# Patient Record
Sex: Female | Born: 1950
Health system: Southern US, Community
[De-identification: ages and names within clinical notes are randomized; demographics above are authoritative.]

## PROBLEM LIST (undated history)

## (undated) DIAGNOSIS — K219 Gastro-esophageal reflux disease without esophagitis: Secondary | ICD-10-CM

## (undated) DIAGNOSIS — M8580 Other specified disorders of bone density and structure, unspecified site: Secondary | ICD-10-CM

## (undated) DIAGNOSIS — C541 Malignant neoplasm of endometrium: Secondary | ICD-10-CM

## (undated) DIAGNOSIS — R87619 Unspecified abnormal cytological findings in specimens from cervix uteri: Secondary | ICD-10-CM

## (undated) DIAGNOSIS — Z9221 Personal history of antineoplastic chemotherapy: Secondary | ICD-10-CM

## (undated) DIAGNOSIS — Z923 Personal history of irradiation: Secondary | ICD-10-CM

## (undated) DIAGNOSIS — E785 Hyperlipidemia, unspecified: Secondary | ICD-10-CM

## (undated) DIAGNOSIS — T7840XA Allergy, unspecified, initial encounter: Secondary | ICD-10-CM

## (undated) DIAGNOSIS — F419 Anxiety disorder, unspecified: Secondary | ICD-10-CM

## (undated) DIAGNOSIS — R011 Cardiac murmur, unspecified: Secondary | ICD-10-CM

## (undated) HISTORY — DX: Allergy, unspecified, initial encounter: T78.40XA

## (undated) HISTORY — PX: TUBAL LIGATION: SHX77

## (undated) HISTORY — DX: Other specified disorders of bone density and structure, unspecified site: M85.80

## (undated) HISTORY — PX: LAPAROSCOPY: SHX197

## (undated) HISTORY — DX: Cardiac murmur, unspecified: R01.1

## (undated) HISTORY — DX: Malignant neoplasm of endometrium: C54.1

## (undated) HISTORY — DX: Anxiety disorder, unspecified: F41.9

## (undated) HISTORY — DX: Hyperlipidemia, unspecified: E78.5

## (undated) HISTORY — PX: POLYPECTOMY: SHX149

## (undated) HISTORY — DX: Unspecified abnormal cytological findings in specimens from cervix uteri: R87.619

## (undated) HISTORY — PX: COLONOSCOPY: SHX174

## (undated) HISTORY — DX: Gastro-esophageal reflux disease without esophagitis: K21.9

## (undated) HISTORY — DX: Personal history of antineoplastic chemotherapy: Z92.21

## (undated) HISTORY — DX: Personal history of irradiation: Z92.3

---

## 1997-12-31 ENCOUNTER — Other Ambulatory Visit: Admission: RE | Admit: 1997-12-31 | Discharge: 1997-12-31 | Payer: Self-pay | Admitting: Obstetrics and Gynecology

## 2004-12-30 ENCOUNTER — Ambulatory Visit: Payer: Self-pay | Admitting: Internal Medicine

## 2005-01-04 ENCOUNTER — Ambulatory Visit: Payer: Self-pay | Admitting: Internal Medicine

## 2005-01-06 ENCOUNTER — Ambulatory Visit: Payer: Self-pay | Admitting: Internal Medicine

## 2005-05-25 ENCOUNTER — Ambulatory Visit: Payer: Self-pay | Admitting: Family Medicine

## 2005-11-21 ENCOUNTER — Ambulatory Visit: Payer: Self-pay | Admitting: Internal Medicine

## 2006-11-15 ENCOUNTER — Ambulatory Visit: Payer: Self-pay | Admitting: Internal Medicine

## 2006-11-15 LAB — CONVERTED CEMR LAB
ALT: 23 units/L (ref 0–40)
AST: 20 units/L (ref 0–37)
Albumin: 3.9 g/dL (ref 3.5–5.2)
Alkaline Phosphatase: 86 units/L (ref 39–117)
Bilirubin, Direct: 0.1 mg/dL (ref 0.0–0.3)
Cholesterol: 213 mg/dL (ref 0–200)
Direct LDL: 132.1 mg/dL
HDL: 43.6 mg/dL (ref >39.0)
Total Bilirubin: 0.7 mg/dL (ref 0.3–1.2)
Total CHOL/HDL Ratio: 4.9
Total Protein: 6.5 g/dL (ref 6.0–8.3)
Triglycerides: 185 mg/dL — ABNORMAL HIGH (ref 0–149)
VLDL: 37 mg/dL (ref 0–40)

## 2008-05-06 ENCOUNTER — Telehealth: Payer: Self-pay | Admitting: Internal Medicine

## 2008-05-08 ENCOUNTER — Ambulatory Visit: Payer: Self-pay | Admitting: Internal Medicine

## 2008-05-08 DIAGNOSIS — E785 Hyperlipidemia, unspecified: Secondary | ICD-10-CM | POA: Insufficient documentation

## 2008-05-08 DIAGNOSIS — F329 Major depressive disorder, single episode, unspecified: Secondary | ICD-10-CM | POA: Insufficient documentation

## 2008-05-09 LAB — CONVERTED CEMR LAB
Bilirubin, Direct: 0.1 mg/dL (ref 0.0–0.3)
Direct LDL: 138.9 mg/dL
HDL: 47.7 mg/dL (ref >39.0)
Total Bilirubin: 0.7 mg/dL (ref 0.3–1.2)
Total CHOL/HDL Ratio: 4.6
Triglycerides: 122 mg/dL (ref 0–149)
VLDL: 24 mg/dL (ref 0–40)

## 2008-06-10 ENCOUNTER — Ambulatory Visit: Payer: Self-pay | Admitting: Internal Medicine

## 2008-06-24 ENCOUNTER — Encounter: Payer: Self-pay | Admitting: Internal Medicine

## 2008-06-24 ENCOUNTER — Ambulatory Visit: Payer: Self-pay | Admitting: Internal Medicine

## 2008-06-25 ENCOUNTER — Encounter: Payer: Self-pay | Admitting: Internal Medicine

## 2009-08-01 LAB — HM MAMMOGRAPHY: HM Mammogram: NORMAL

## 2009-08-01 LAB — CONVERTED CEMR LAB: Pap Smear: NORMAL

## 2009-10-14 ENCOUNTER — Telehealth: Payer: Self-pay | Admitting: Internal Medicine

## 2009-10-15 ENCOUNTER — Ambulatory Visit: Payer: Self-pay | Admitting: Internal Medicine

## 2009-10-15 LAB — CONVERTED CEMR LAB
BUN: 10 mg/dL (ref 6–23)
Basophils Absolute: 0 10*3/uL (ref 0.0–0.1)
Bilirubin Urine: NEGATIVE
Chloride: 108 meq/L (ref 96–112)
Cholesterol: 272 mg/dL — ABNORMAL HIGH (ref 0–200)
Creatinine, Ser: 0.9 mg/dL (ref 0.4–1.2)
Direct LDL: 192.6 mg/dL
Eosinophils Relative: 1.8 % (ref 0.0–5.0)
GFR calc non Af Amer: 68.42 mL/min (ref >60)
Glucose, Bld: 89 mg/dL (ref 70–99)
Glucose, Urine, Semiquant: NEGATIVE
HCT: 43 % (ref 36.0–46.0)
HDL: 48.5 mg/dL (ref >39.00)
Ketones, urine, test strip: NEGATIVE
Lymphs Abs: 2.9 10*3/uL (ref 0.7–4.0)
MCV: 102.8 fL — ABNORMAL HIGH (ref 78.0–100.0)
Monocytes Absolute: 0.6 10*3/uL (ref 0.1–1.0)
Neutrophils Relative %: 62.6 % (ref 43.0–77.0)
Platelets: 303 10*3/uL (ref 150.0–400.0)
Potassium: 4.5 meq/L (ref 3.5–5.1)
Protein, U semiquant: NEGATIVE
RDW: 12.3 % (ref 11.5–14.6)
Specific Gravity, Urine: 1.01
TSH: 1.69 microintl units/mL (ref 0.35–5.50)
Total Bilirubin: 0.4 mg/dL (ref 0.3–1.2)
VLDL: 52 mg/dL — ABNORMAL HIGH (ref 0.0–40.0)
WBC: 9.9 10*3/uL (ref 4.5–10.5)
pH: 6

## 2009-11-12 ENCOUNTER — Ambulatory Visit: Payer: Self-pay | Admitting: Internal Medicine

## 2009-11-12 DIAGNOSIS — J019 Acute sinusitis, unspecified: Secondary | ICD-10-CM

## 2010-01-15 ENCOUNTER — Ambulatory Visit: Payer: Self-pay | Admitting: Internal Medicine

## 2010-01-15 LAB — CONVERTED CEMR LAB
ALT: 24 units/L (ref 0–35)
AST: 28 units/L (ref 0–37)
Albumin: 4.2 g/dL (ref 3.5–5.2)
Cholesterol: 205 mg/dL — ABNORMAL HIGH (ref 0–200)
Direct LDL: 133.9 mg/dL
HDL: 55.5 mg/dL (ref >39.00)
Total Protein: 7 g/dL (ref 6.0–8.3)
Triglycerides: 125 mg/dL (ref 0.0–149.0)

## 2010-09-02 NOTE — Progress Notes (Signed)
Summary: REFILL  Phone Note Refill Request Message from:  Fax from Pharmacy  Refills Requested: Medication #1:  LIPITOR 20 MG  TABS Take 1 tablet by mouth at bedtime as directed [facility] [facility]---[phone]     FAX---[phone]  Initial call taken by: Warnell Forester,  October 14, 2009 8:34 AM  Follow-up for Phone Call        INFO ONLY - Pt has appt scheduled for labs 10/15/2009.... follow-up appt w/ Dr Cato Mulligan on 10/27/2009.  Follow-up by: Debbra Riding,  October 14, 2009 11:25 AM    Prescriptions: LIPITOR 20 MG  TABS (ATORVASTATIN CALCIUM) Take 1 tablet by mouth at bedtime as directed  #30 x 0   Entered by:   Lynann Beaver CMA   Authorized by:   Birdie Sons MD   Signed by:   Lynann Beaver CMA on 10/14/2009   Method used:   [facility]ronically to        [facility]  [phone]* (retail)       [address] [place], [place]  [address]       Ph: [phone] or [phone]       Fax: [phone]   RxID:   [id number]

## 2010-09-02 NOTE — Progress Notes (Signed)
Summary: REQ FOR LABWORK  Phone Note Call from Patient   Caller: Patient [phone] Reason for Call: Talk to Doctor Summary of Call: Pt called to make appt for labs  /   f/u appt with Dr Cato Mulligan....Marland KitchenMarland KitchenCan you adv orders for labwork? Hepatic/Liver Function Pnl only?  Initial call taken by: Debbra Riding,  October 14, 2009 11:21 AM  Follow-up for Phone Call        pt hasnet been seen since 2009 so it is hard to decided what to do - he probably needs a physical- I would see if he doesnt want t cpxc and then do cpx labs prioir Follow-up by: Willy Eddy, LPN,  October 14, 2009 1:27 PM  Additional Follow-up for Phone Call Additional follow up Details #1::        Phone Call Completed --- Pt decided to have labs tomorrow, 10/15/2009 and will have cpx w/ Dr Cato Mulligan on 11/05/2009. Additional Follow-up by: Debbra Riding,  October 14, 2009 2:03 PM

## 2010-09-02 NOTE — Assessment & Plan Note (Signed)
Summary: CPX // RS/pt rescd from bump//ccm   Vital Signs:  Patient profile:   60 year old female Menstrual status:  postmenopausal Height:      63 inches Weight:      155 pounds BMI:     27.56 Pulse rate:   78 / minute Pulse rhythm:   regular Resp:     12 per minute BP sitting:   100 / 80  (left arm) Cuff size:   regular  Vitals Entered By: Gladis Riffle, RN (November 12, 2009 12:08 PM) CC: cpx, labs done--has gyn Is Patient Diabetic? No     Menstrual Status postmenopausal Last PAP Result normal-pt's report   CC:  cpx and labs done--has gyn.  History of Present Illness: cpx  Preventive Screening-Counseling & Management  Alcohol-Tobacco     Smoking Status: quit     Year Quit: september 2008  Current Problems (verified): 1)  Hyperlipidemia  (ICD-272.4) 2)  Depression  (ICD-311)  Current Medications (verified): 1)  None  Allergies: 1)  ! Codeine Sulfate (Codeine Sulfate) 2)  ! Neosporin  Past History:  Past Medical History: Last updated: 05/08/2008 migraine Depression Hyperlipidemia  Past Surgical History: Last updated: 05/08/2008 Tubal ligation laparoscopy  Family History: Last updated: 11/12/2009 father - chf, mi, in 21s brother mi---40 yo  Social History: Last updated: 05/08/2008 Married Former Smoker  Risk Factors: Smoking Status: quit (11/12/2009)  Family History: father - chf, mi, in 8s brother mi---40 yo  Physical Exam  General:  Well-developed,well-nourished,in no acute distress; alert,appropriate and cooperative throughout examination Head:  normocephalic and atraumatic.  normocephalic and atraumatic.   Eyes:  pupils equal and pupils round.  pupils equal and pupils round.   Ears:  R ear normal and L ear normal.  R ear normal and L ear normal.   Nose:  no external deformity.  no external deformity and no external erythema.   Abdomen:  soft and non-tender.   Msk:  No deformity or scoliosis noted of thoracic or lumbar spine.     Pulses:  R radial normal and L radial normal.   Extremities:  No clubbing, cyanosis, edema, or deformity noted  Neurologic:  cranial nerves II-XII intact and gait normal.   Skin:  turgor normal and color normal.   Psych:  memory intact for recent and remote and good eye contact.     Impression & Recommendations:  Problem # 1:  Preventive Health Care (ICD-V70.0)  health maint UTD  Problem # 2:  HYPERLIPIDEMIA (ICD-272.4)  controlled Labs Reviewed: SGOT: 19 (10/15/2009)   SGPT: 18 (10/15/2009)   HDL:48.50 (10/15/2009), 47.7 (05/08/2008)  LDL:DEL (05/08/2008), DEL (11/15/2006)  Chol:272 (10/15/2009), 218 (05/08/2008)  Trig:260.0 (10/15/2009), 122 (05/08/2008)  Problem # 3:  SINUSITIS- ACUTE-NOS (ICD-461.9) new complaint   URI sxs dration 2 weeks she describes mucopurulent exudate from her nose.  Sometimes associated with epistaxis.  Has some facial discomfort.  She denies any fevers or chills.  On examination she does have some nasal mucosal erythema.  Given duration will treat with antibiotics.  Side effects discussed. insomnia  advised exercise melatonin  Complete Medication List: 1)  Simvastatin 40 Mg Tabs (Simvastatin) .... Take one tablet at bedtime or as directed  Other Orders: Tdap => 55yrs IM [phone]) Admin 1st Vaccine (60454)  Preventive Care Screening  Mammogram:    Date:  08/01/2009    Next Due:  08/2011    Results:  normal-pt's report   Pap Smear:    Date:  08/01/2009  Next Due:  08/2012    Results:  normal-pt's report    Patient Instructions: 1)  Please schedule a follow-up appointment in 2 months. just labs---no office visit 2)  lipids 272.4 3)  liver 995.2  Prescriptions: DOXYCYCLINE HYCLATE 100 MG CAPS (DOXYCYCLINE HYCLATE) Take 1 tab twice a day  #20 x 0   Entered and Authorized by:   Birdie Sons MD   Signed by:   Birdie Sons MD on 11/12/2009   Method used:   [facility]ronically to        [facility]  [phone]* (retail)       [address] [place], [place]  [address]       Ph: [phone] or [phone]       Fax: [phone]   RxID:   [id number] SIMVASTATIN 40 MG TABS (SIMVASTATIN) Take one tablet at bedtime or as directed  #90 x 3   Entered and Authorized by:   Birdie Sons MD   Signed by:   Birdie Sons MD on 11/12/2009   Method used:   [facility]ronically to        [facility]  [phone]* (retail)       [address] [place], [place]  [address]       Ph: [phone] or [phone]       Fax: [phone]   RxID:   [id number]    Immunizations Administered:  Tetanus Vaccine:    Vaccine Type: Tdap    Site: left deltoid    Mfr: GlaxoSmithKline    Dose: 0.5 ml    Route: IM    Given by: Gladis Riffle, RN    Exp. Date: 10/24/2011    Lot #: [id number]

## 2011-05-28 ENCOUNTER — Other Ambulatory Visit: Payer: Self-pay | Admitting: Internal Medicine

## 2011-05-30 ENCOUNTER — Telehealth: Payer: Self-pay | Admitting: Internal Medicine

## 2011-05-30 NOTE — Telephone Encounter (Signed)
Pt called and is req to get cholesterol labs ordered so she can get her Simvastatin 40 mg. Pt takes 20 mg a day. [facility] [address]

## 2011-05-30 NOTE — Telephone Encounter (Signed)
Pt needs CPX with labs 1 wk prior

## 2011-05-31 NOTE — Telephone Encounter (Signed)
Pt is set up for cpx and labs

## 2011-07-12 ENCOUNTER — Other Ambulatory Visit (INDEPENDENT_AMBULATORY_CARE_PROVIDER_SITE_OTHER): Payer: No Typology Code available for payment source

## 2011-07-12 DIAGNOSIS — Z0000 Encounter for general adult medical examination without abnormal findings: Secondary | ICD-10-CM

## 2011-07-12 LAB — POCT URINALYSIS DIPSTICK
Ketones, UA: NEGATIVE
Protein, UA: NEGATIVE
Spec Grav, UA: <=1.005
Urobilinogen, UA: 0.2
pH, UA: 6

## 2011-07-12 LAB — CBC WITH DIFFERENTIAL/PLATELET
Basophils Absolute: 0 10*3/uL (ref 0.0–0.1)
Eosinophils Relative: 0.9 % (ref 0.0–5.0)
Lymphs Abs: 1.8 10*3/uL (ref 0.7–4.0)
MCV: 102 fl — ABNORMAL HIGH (ref 78.0–100.0)
Monocytes Absolute: 0.6 10*3/uL (ref 0.1–1.0)
Monocytes Relative: 5.1 % (ref 3.0–12.0)
Neutrophils Relative %: 77.9 % — ABNORMAL HIGH (ref 43.0–77.0)
Platelets: 262 10*3/uL (ref 150.0–400.0)
RDW: 13.4 % (ref 11.5–14.6)
WBC: 11.5 10*3/uL — ABNORMAL HIGH (ref 4.5–10.5)

## 2011-07-12 LAB — BASIC METABOLIC PANEL
BUN: 14 mg/dL (ref 6–23)
Creatinine, Ser: 1 mg/dL (ref 0.4–1.2)
GFR: 62.37 mL/min (ref >60.00)
Glucose, Bld: 91 mg/dL (ref 70–99)

## 2011-07-12 LAB — HEPATIC FUNCTION PANEL
ALT: 27 U/L (ref 0–35)
Bilirubin, Direct: 0 mg/dL (ref 0.0–0.3)
Total Bilirubin: 0.6 mg/dL (ref 0.3–1.2)

## 2011-07-12 LAB — LIPID PANEL
Cholesterol: 191 mg/dL (ref 0–200)
HDL: 55.3 mg/dL (ref >39.00)
LDL Cholesterol: 118 mg/dL — ABNORMAL HIGH (ref 0–99)
Triglycerides: 90 mg/dL (ref 0.0–149.0)
VLDL: 18 mg/dL (ref 0.0–40.0)

## 2011-07-12 LAB — TSH: TSH: 1.01 u[IU]/mL (ref 0.35–5.50)

## 2011-07-15 ENCOUNTER — Other Ambulatory Visit (HOSPITAL_COMMUNITY)
Admission: RE | Admit: 2011-07-15 | Discharge: 2011-07-15 | Disposition: A | Discharge status: Home / Self Care | Payer: No Typology Code available for payment source | Source: Ambulatory Visit | Attending: Obstetrics and Gynecology | Admitting: Obstetrics and Gynecology

## 2011-07-15 ENCOUNTER — Other Ambulatory Visit: Payer: Self-pay | Admitting: Obstetrics and Gynecology

## 2011-07-15 DIAGNOSIS — Z01419 Encounter for gynecological examination (general) (routine) without abnormal findings: Secondary | ICD-10-CM | POA: Insufficient documentation

## 2011-07-19 ENCOUNTER — Encounter: Payer: Self-pay | Admitting: Internal Medicine

## 2011-08-16 ENCOUNTER — Ambulatory Visit (INDEPENDENT_AMBULATORY_CARE_PROVIDER_SITE_OTHER): Payer: No Typology Code available for payment source | Admitting: Internal Medicine

## 2011-08-16 ENCOUNTER — Encounter: Payer: Self-pay | Admitting: Internal Medicine

## 2011-08-16 VITALS — BP 142/86 | HR 96 | Temp 98.2°F | Ht 63.0 in | Wt 151.0 lb

## 2011-08-16 DIAGNOSIS — Z0000 Encounter for general adult medical examination without abnormal findings: Secondary | ICD-10-CM

## 2011-08-16 MED ORDER — ATORVASTATIN CALCIUM 20 MG PO TABS: 20.0000 mg | ORAL_TABLET | Freq: Every day | ORAL | Status: DC

## 2011-08-16 NOTE — Progress Notes (Signed)
  Subjective:    Patient ID: Jacqueline Hammond, female    DOB: 07-Jan-1951, 61 y.o.   MRN: [id number]  HPI  cpx  Past Medical History  Diagnosis Date  . Hyperlipidemia   . Depression   . Migraine    Past Surgical History  Procedure Date  . Tubal ligation   . Laparoscopy     reports that she has quit smoking. Her smoking use included Cigarettes. She does not have any smokeless tobacco history on file. Her alcohol and drug histories not on file. family history includes Heart attack (age of onset:40) in her brother and father and Heart failure in her father. Allergies  Allergen Reactions  . Codeine Sulfate     REACTION: nausea, pass out  . Triple Antibiotic     REACTION: redness     Review of Systems  patient denies chest pain, shortness of breath, orthopnea. Denies lower extremity edema, abdominal pain, change in appetite, change in bowel movements. Patient denies rashes, musculoskeletal complaints. No other specific complaints in a complete review of systems.      Objective:   Physical Exam  Well-developed well-nourished female in no acute distress. HEENT exam atraumatic, normocephalic, extraocular muscles are intact. Neck is supple. No jugular venous distention no thyromegaly. Chest clear to auscultation without increased work of breathing. Cardiac exam S1 and S2 are regular. Abdominal exam active bowel sounds, soft, nontender. Extremities no edema. Neurologic exam she is alert without any motor sensory deficits. Gait is normal.     Assessment & Plan:  Well Visit: Health Maint UTD

## 2012-04-12 ENCOUNTER — Ambulatory Visit (INDEPENDENT_AMBULATORY_CARE_PROVIDER_SITE_OTHER): Payer: No Typology Code available for payment source | Admitting: Internal Medicine

## 2012-04-12 ENCOUNTER — Encounter: Payer: Self-pay | Admitting: Internal Medicine

## 2012-04-12 VITALS — BP 120/80 | Temp 98.4°F | Wt 148.0 lb

## 2012-04-12 DIAGNOSIS — E785 Hyperlipidemia, unspecified: Secondary | ICD-10-CM

## 2012-04-12 DIAGNOSIS — Z23 Encounter for immunization: Secondary | ICD-10-CM

## 2012-04-12 DIAGNOSIS — J069 Acute upper respiratory infection, unspecified: Secondary | ICD-10-CM

## 2012-04-12 MED ORDER — BENZONATATE 200 MG PO CAPS: 200.0000 mg | ORAL_CAPSULE | Freq: Three times a day (TID) | ORAL | Status: AC | PRN

## 2012-04-12 NOTE — Patient Instructions (Addendum)
Use saline irrigation, warm  moist compresses and over-the-counter decongestants only as directed.  Call if there is no improvement in 5 to 7 days, or sooner if you develop increasing pain, fever, or any new symptoms.  Mucinex DM one twice daily  Cough medicine as directed

## 2012-04-12 NOTE — Progress Notes (Signed)
Subjective:    Patient ID: Jacqueline Hammond, female    DOB: Oct 22, 1950, 61 y.o.   MRN: [id number]  HPI  61 year old patient who presents with a two-week history of bed and chest congestion with a productive cough. Sputum is clear. No fever wheezing shortness of breath. She has a history of dyslipidemia taking atorvastatin only. Non-smoker. Codeine allergy  Past Medical History  Diagnosis Date  . Hyperlipidemia   . Depression   . Migraine     History   Social History  . Marital Status: Married    Spouse Name: N/A    Number of Children: N/A  . Years of Education: N/A   Occupational History  . Not on file.   Social History Main Topics  . Smoking status: Former Smoker    Types: Cigarettes  . Smokeless tobacco: Not on file  . Alcohol Use: Not on file  . Drug Use: Not on file  . Sexually Active: Not on file   Other Topics Concern  . Not on file   Social History Narrative  . No narrative on file    Past Surgical History  Procedure Date  . Tubal ligation   . Laparoscopy     Family History  Problem Relation Age of Onset  . Heart failure Father   . Heart attack Father 73  . Heart attack Brother 40    Allergies  Allergen Reactions  . Codeine Sulfate     REACTION: nausea, pass out  . Neomycin-Bacitracin Zn-Polymyx     REACTION: redness    Current Outpatient Prescriptions on File Prior to Visit  Medication Sig Dispense Refill  . atorvastatin (LIPITOR) 20 MG tablet Take 1 tablet (20 mg total) by mouth daily.  90 tablet  3    BP 120/80  Temp 98.4 F (36.9 C) (Oral)  Wt 148 lb (67.132 kg)        Review of Systems  Constitutional: Negative.   HENT: Positive for congestion. Negative for hearing loss, sore throat, rhinorrhea, dental problem, sinus pressure and tinnitus.   Eyes: Negative for pain, discharge and visual disturbance.  Respiratory: Positive for cough. Negative for shortness of breath.   Cardiovascular: Negative for chest pain, palpitations and  leg swelling.  Gastrointestinal: Negative for nausea, vomiting, abdominal pain, diarrhea, constipation, blood in stool and abdominal distention.  Genitourinary: Negative for dysuria, urgency, frequency, hematuria, flank pain, vaginal bleeding, vaginal discharge, difficulty urinating, vaginal pain and pelvic pain.  Musculoskeletal: Negative for joint swelling, arthralgias and gait problem.  Skin: Negative for rash.  Neurological: Negative for dizziness, syncope, speech difficulty, weakness, numbness and headaches.  Hematological: Negative for adenopathy.  Psychiatric/Behavioral: Negative for behavioral problems, dysphoric mood and agitation. The patient is not nervous/anxious.        Objective:   Physical Exam  Constitutional: She is oriented to person, place, and time. She appears well-developed and well-nourished.  HENT:  Head: Normocephalic.  Right Ear: External ear normal.  Left Ear: External ear normal.  Mouth/Throat: Oropharynx is clear and moist.  Eyes: Conjunctivae normal and EOM are normal. Pupils are equal, round, and reactive to light.  Neck: Normal range of motion. Neck supple. No thyromegaly present.  Cardiovascular: Normal rate, regular rhythm, normal heart sounds and intact distal pulses.   Pulmonary/Chest: Effort normal and breath sounds normal. No respiratory distress. She has no wheezes. She has no rales. She exhibits no tenderness.  Abdominal: Soft. Bowel sounds are normal. She exhibits no mass. There is no tenderness.  Musculoskeletal: Normal  range of motion.  Lymphadenopathy:    She has no cervical adenopathy.  Neurological: She is alert and oriented to person, place, and time.  Skin: Skin is warm and dry. No rash noted.  Psychiatric: She has a normal mood and affect. Her behavior is normal.          Assessment & Plan:   Viral URI with cough. We'll treat symptomatically

## 2012-10-06 ENCOUNTER — Other Ambulatory Visit: Payer: Self-pay | Admitting: Internal Medicine

## 2012-12-04 ENCOUNTER — Ambulatory Visit (INDEPENDENT_AMBULATORY_CARE_PROVIDER_SITE_OTHER): Payer: No Typology Code available for payment source | Admitting: Internal Medicine

## 2012-12-04 ENCOUNTER — Encounter: Payer: Self-pay | Admitting: Internal Medicine

## 2012-12-04 VITALS — BP 132/80 | HR 84 | Temp 98.0°F | Ht 63.0 in | Wt 144.0 lb

## 2012-12-04 DIAGNOSIS — R319 Hematuria, unspecified: Secondary | ICD-10-CM | POA: Insufficient documentation

## 2012-12-04 DIAGNOSIS — Z23 Encounter for immunization: Secondary | ICD-10-CM

## 2012-12-04 DIAGNOSIS — E785 Hyperlipidemia, unspecified: Secondary | ICD-10-CM

## 2012-12-04 LAB — BASIC METABOLIC PANEL
BUN: 14 mg/dL (ref 6–23)
Chloride: 103 mEq/L (ref 96–112)
GFR: 62.08 mL/min (ref >60.00)
Glucose, Bld: 104 mg/dL — ABNORMAL HIGH (ref 70–99)
Potassium: 5.3 mEq/L — ABNORMAL HIGH (ref 3.5–5.1)
Sodium: 138 mEq/L (ref 135–145)

## 2012-12-04 LAB — TSH: TSH: 0.86 u[IU]/mL (ref 0.35–5.50)

## 2012-12-04 LAB — LIPID PANEL
Cholesterol: 190 mg/dL (ref 0–200)
HDL: 48.8 mg/dL (ref >39.00)
LDL Cholesterol: 121 mg/dL — ABNORMAL HIGH (ref 0–99)
VLDL: 19.8 mg/dL (ref 0.0–40.0)

## 2012-12-04 LAB — HEPATIC FUNCTION PANEL
ALT: 23 U/L (ref 0–35)
AST: 19 U/L (ref 0–37)
Albumin: 4.1 g/dL (ref 3.5–5.2)
Alkaline Phosphatase: 81 U/L (ref 39–117)
Total Bilirubin: 0.7 mg/dL (ref 0.3–1.2)

## 2012-12-04 NOTE — Assessment & Plan Note (Signed)
Needs f/u labs Will check today

## 2012-12-05 NOTE — Progress Notes (Signed)
Patient ID: Jacqueline Hammond, female   DOB: Mar 07, 1951, 62 y.o.   MRN: [id number] Patient comes in without specific complaints. She needs refill of medications. She has hyperlipidemia and is tolerating medications without difficulty.  Reviewed past medical history, medications.  Review of systems: No specific complaints.  Examination: Well-appearing female in no acute distress. Chest clear to auscultation cardiac exam S1-S2 are regular. Extremities no edema.

## 2012-12-06 ENCOUNTER — Other Ambulatory Visit: Payer: Self-pay | Admitting: *Deleted

## 2012-12-06 MED ORDER — ATORVASTATIN CALCIUM 20 MG PO TABS: ORAL_TABLET | ORAL | Status: DC

## 2013-05-22 ENCOUNTER — Encounter: Payer: Self-pay | Admitting: Internal Medicine

## 2013-06-06 ENCOUNTER — Other Ambulatory Visit: Payer: Self-pay

## 2013-09-18 ENCOUNTER — Other Ambulatory Visit: Payer: Self-pay | Admitting: Internal Medicine

## 2013-12-28 ENCOUNTER — Encounter: Payer: Self-pay | Admitting: Internal Medicine

## 2014-03-25 ENCOUNTER — Encounter: Payer: Self-pay | Admitting: Internal Medicine

## 2014-05-22 ENCOUNTER — Ambulatory Visit: Payer: No Typology Code available for payment source | Admitting: Family Medicine

## 2014-06-04 ENCOUNTER — Ambulatory Visit: Payer: No Typology Code available for payment source | Admitting: Family Medicine

## 2014-06-14 ENCOUNTER — Other Ambulatory Visit: Payer: Self-pay | Admitting: Internal Medicine

## 2014-08-21 ENCOUNTER — Other Ambulatory Visit: Payer: Self-pay | Admitting: Internal Medicine

## 2014-10-10 ENCOUNTER — Encounter: Payer: Self-pay | Admitting: Family Medicine

## 2014-10-10 ENCOUNTER — Ambulatory Visit (INDEPENDENT_AMBULATORY_CARE_PROVIDER_SITE_OTHER): Payer: 59 | Admitting: Family Medicine

## 2014-10-10 VITALS — BP 120/82 | HR 86 | Temp 98.2°F | Wt 148.0 lb

## 2014-10-10 DIAGNOSIS — E785 Hyperlipidemia, unspecified: Secondary | ICD-10-CM

## 2014-10-10 DIAGNOSIS — Z808 Family history of malignant neoplasm of other organs or systems: Secondary | ICD-10-CM | POA: Insufficient documentation

## 2014-10-10 DIAGNOSIS — D7589 Other specified diseases of blood and blood-forming organs: Secondary | ICD-10-CM

## 2014-10-10 DIAGNOSIS — Z87891 Personal history of nicotine dependence: Secondary | ICD-10-CM | POA: Insufficient documentation

## 2014-10-10 DIAGNOSIS — G4700 Insomnia, unspecified: Secondary | ICD-10-CM

## 2014-10-10 LAB — CBC
HCT: 40.7 % (ref 36.0–46.0)
HEMOGLOBIN: 13.6 g/dL (ref 12.0–15.0)
MCH: 33.7 pg (ref 26.0–34.0)
MCHC: 33.4 g/dL (ref 30.0–36.0)
MCV: 100.7 fL — ABNORMAL HIGH (ref 78.0–100.0)
MPV: 10.9 fL (ref 8.6–12.4)
PLATELETS: 267 10*3/uL (ref 150–400)
RBC: 4.04 MIL/uL (ref 3.87–5.11)
RDW: 13.2 % (ref 11.5–15.5)
WBC: 8.5 10*3/uL (ref 4.0–10.5)

## 2014-10-10 LAB — VITAMIN B12: VITAMIN B 12: 358 pg/mL (ref 211–911)

## 2014-10-10 LAB — COMPREHENSIVE METABOLIC PANEL
ALT: 16 U/L (ref 0–35)
AST: 16 U/L (ref 0–37)
Albumin: 4.1 g/dL (ref 3.5–5.2)
Alkaline Phosphatase: 87 U/L (ref 39–117)
BILIRUBIN TOTAL: 0.3 mg/dL (ref 0.2–1.2)
BUN: 9 mg/dL (ref 6–23)
CO2: 28 meq/L (ref 19–32)
CREATININE: 0.87 mg/dL (ref 0.50–1.10)
Calcium: 9 mg/dL (ref 8.4–10.5)
Chloride: 106 mEq/L (ref 96–112)
GLUCOSE: 86 mg/dL (ref 70–99)
Potassium: 4.1 mEq/L (ref 3.5–5.3)
Sodium: 139 mEq/L (ref 135–145)
TOTAL PROTEIN: 6.3 g/dL (ref 6.0–8.3)

## 2014-10-10 LAB — LDL CHOLESTEROL, DIRECT: Direct LDL: 123 mg/dL — ABNORMAL HIGH

## 2014-10-10 LAB — FOLATE: FOLATE: 18.2 ng/mL

## 2014-10-10 NOTE — Assessment & Plan Note (Signed)
Mild poor control on atorvastatin 10mg  and increased to 20mg  with no repeat LDL since that time. Direct LDL today.

## 2014-10-10 NOTE — Assessment & Plan Note (Signed)
Controlled with benadryl but discussed tolerance potential. She will stick with every other night use and I am also willing to call in trazodone 50mg  qhs prn #30 5 refills if not effective.

## 2014-10-10 NOTE — Progress Notes (Signed)
Jacqueline Reddish, MD Phone: [phone]  Subjective:  Patient presents today to establish care with me as their new primary care provider. Patient was formerly a patient of Dr. Leanne Chang. Chief complaint-noted.   Hyperlipidemia-suspect improved control now on atorvastatin 20mg , previously at 10mg   Lab Results  Component Value Date   LDLCALC 121* 12/04/2012  Regular exercise: walks every other day 1.5 miles or more, 2 days a week 60 minutes of cario Diet: 90% of the time does well ROS- no chest pain or shortness of breath. No myalgias  Insomnia- new -difficulty sleeping over last year. Will lay awake for hours. Will get headaches after 3 days if does not take something to sleep. Has cut caffeine. Stopped walking late in day. Melatonin hasnt worked. [facility] sleep aid-benadryl helps.  ROS- no vivid dreams, denies snoring or daytime sleepines.   Macrocytosis without anemia Noted on last CBC. No anemia. No b12 or folate drawn.  ROS- no fatigue, unintentional weight loss  The following were reviewed and entered/updated in epic: Past Medical History  Diagnosis Date  . Hyperlipidemia   . Depression   . Migraine    Patient Active Problem List   Diagnosis Date Noted  . Hyperlipidemia 05/08/2008    Priority: Medium  . Family history of melanoma 10/10/2014    Priority: Low  . Former smoker 10/10/2014    Priority: Low  . Insomnia 10/10/2014    Priority: Low  . Hematuria 12/04/2012    Priority: Low   Past Surgical History  Procedure Laterality Date  . Tubal ligation    . Laparoscopy      Family History  Problem Relation Age of Onset  . Heart failure Father   . Heart attack Father 40    smoker, war vet  . Heart attack Brother 53  . Melanoma Father   . Cancer Mother     larynx    Medications- reviewed and updated Current Outpatient Prescriptions  Medication Sig Dispense Refill  . atorvastatin (LIPITOR) 20 MG tablet TAKE 1 TABLET EVERY DAY 30 tablet 1   Allergies-reviewed and  updated Allergies  Allergen Reactions  . Codeine Sulfate     REACTION: nausea, pass out  . Neomycin-Bacitracin Zn-Polymyx     REACTION: redness    History   Social History  . Marital Status: Married    Spouse Name: N/A  . Number of Children: N/A  . Years of Education: N/A   Social History Main Topics  . Smoking status: Former Smoker -- 1.00 packs/day for 32 years    Types: Cigarettes    Quit date: 08/01/2006  . Smokeless tobacco: Not on file  . Alcohol Use: Not on file  . Drug Use: Not on file  . Sexual Activity: Not on file   Other Topics Concern  . None   Social History Narrative   Married 1970 (Jacqueline Hammond is a patient here). 2 sons. 3 grandchildren. 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 Oropharynx normal Neck: no thyromegaly CV: RRR no murmurs rubs or gallops Lungs: CTAB no crackles, wheeze, rhonchi Abdomen: soft/nontender/nondistended/normal  bowel sounds. No rebound or guarding.  Ext: no edema Skin: warm, dry Neuro: grossly normal, moves all extremities, PERRLA  Assessment/Plan:  Hyperlipidemia Mild poor control on atorvastatin 10mg  and increased to 20mg  with no repeat LDL since that time. Direct LDL today.    Insomnia Controlled with benadryl but discussed tolerance potential. She will stick with every other night use and I am also willing to call in trazodone 50mg  qhs prn #30 5 refills if not effective.    Macrocytosis without anemia Check CBC, b12, folate.   1 year follow up planned.  Orders Placed This Encounter  Procedures  . CBC    [place]  . Comprehensive metabolic  panel    [place]  . LDL cholesterol, direct    [place]  . Vitamin B12  . Folate

## 2014-10-10 NOTE — Patient Instructions (Addendum)
Call or send a mychart message to have [place] place referral at least 3 weeks before dermatology visit for insurance purpose  Try benadryl every other night for sleep. Call me if doesn't work and we can try trazodone.   Update labs today  Yearly check in unless you need [place]

## 2014-10-16 ENCOUNTER — Other Ambulatory Visit: Payer: Self-pay | Admitting: Family Medicine

## 2014-12-26 ENCOUNTER — Ambulatory Visit (INDEPENDENT_AMBULATORY_CARE_PROVIDER_SITE_OTHER): Payer: 59 | Admitting: Family Medicine

## 2014-12-26 ENCOUNTER — Encounter: Payer: Self-pay | Admitting: Family Medicine

## 2014-12-26 VITALS — BP 130/80 | HR 90 | Temp 98.0°F | Wt 146.0 lb

## 2014-12-26 DIAGNOSIS — M25561 Pain in right knee: Secondary | ICD-10-CM

## 2014-12-26 DIAGNOSIS — M25461 Effusion, right knee: Secondary | ICD-10-CM | POA: Diagnosis not present

## 2014-12-26 MED ORDER — ETODOLAC 400 MG PO TABS
400.0000 mg | ORAL_TABLET | Freq: Two times a day (BID) | ORAL | Status: DC
Start: 2014-12-26 — End: 2015-08-06

## 2014-12-26 NOTE — Progress Notes (Signed)
Pre visit review using our clinic review tool, if applicable. No additional management support is needed unless otherwise documented below in the visit note. 

## 2014-12-26 NOTE — Progress Notes (Signed)
   Subjective:    Patient ID: Jacqueline Hammond, female    DOB: September 30, 1950, 64 y.o.   MRN: [id number]  HPI Patient seen with right knee swelling. Very active lifestyle. Frequently does high-impact Zumba classes. Does not recall specific injury but about 3 days ago noticed some pain and swelling right knee. Pain is somewhat difficult to localize- but mostly anterior and ?medial. No locking or giving way. She has tried some icing and had leftover etodolac which helped. No prior history of knee difficulties.  Past Medical History  Diagnosis Date  . Hyperlipidemia   . Depression   . Migraine    Past Surgical History  Procedure Laterality Date  . Tubal ligation    . Laparoscopy      reports that she quit smoking about 8 years ago. Her smoking use included Cigarettes. She has a 32 pack-year smoking history. She does not have any smokeless tobacco history on file. Her alcohol and drug histories are not on file. family history includes Cancer in her mother; Heart attack (age of onset: 48) in her brother and father; Heart failure in her father; Melanoma in her father. Allergies  Allergen Reactions  . Codeine Sulfate     REACTION: nausea, pass out  . Neomycin-Bacitracin Zn-Polymyx     REACTION: redness      Review of Systems  Constitutional: Negative for fever and chills.       Objective:   Physical Exam  Constitutional: She appears well-developed and well-nourished. No distress.  Cardiovascular: Normal rate and regular rhythm.   Pulmonary/Chest: Effort normal and breath sounds normal.  Musculoskeletal:  Right knee full range of motion. No warmth or erythema. No ecchymosis. Small effusion. Collateral ligament testing normal. Cruciate ligament testing normal. Moderate medial joint line tenderness. Minimal lateral joint line tenderness. Full range of motion          Assessment & Plan:  Right knee pain with small effusion. Question small meniscal tear. Continue icing. Lodine 400 mg  twice daily as needed. Consider elastic knee sleeve for additional support. Avoid squatting or high-impact activities. Follow-up with primary in 3-4 weeks if not improving. She will look at alternative exercises such as swimming in the meantime

## 2014-12-26 NOTE — Patient Instructions (Signed)
Continue with icing 2-3 times daily Consider elastic knee support. Avoid squatting exercises or high impact exercises Follow up with primary in 3-4 weeks if no better.

## 2015-03-04 ENCOUNTER — Telehealth: Payer: Self-pay | Admitting: Family Medicine

## 2015-03-04 DIAGNOSIS — Z808 Family history of malignant neoplasm of other organs or systems: Secondary | ICD-10-CM

## 2015-03-04 NOTE — Telephone Encounter (Signed)
Pt call to ask for a referral to her dermatologist for her annual exam  Family hx of melanoma   Dr. Otila Back

## 2015-03-06 NOTE — Telephone Encounter (Signed)
Order placed and I called the pt and informed her of this and to expect a call with appt info.

## 2015-03-06 NOTE — Telephone Encounter (Signed)
Yes thanks 

## 2015-03-06 NOTE — Telephone Encounter (Signed)
Okay to refer? 

## 2015-07-14 ENCOUNTER — Ambulatory Visit (INDEPENDENT_AMBULATORY_CARE_PROVIDER_SITE_OTHER): Payer: 59

## 2015-07-14 DIAGNOSIS — Z23 Encounter for immunization: Secondary | ICD-10-CM | POA: Diagnosis not present

## 2015-08-06 ENCOUNTER — Other Ambulatory Visit: Payer: Self-pay | Admitting: Family Medicine

## 2015-08-11 ENCOUNTER — Encounter: Payer: Self-pay | Admitting: Cardiovascular Disease

## 2015-09-30 ENCOUNTER — Other Ambulatory Visit: Payer: Self-pay | Admitting: Family Medicine

## 2015-09-30 DIAGNOSIS — C541 Malignant neoplasm of endometrium: Secondary | ICD-10-CM

## 2015-09-30 DIAGNOSIS — M8580 Other specified disorders of bone density and structure, unspecified site: Secondary | ICD-10-CM

## 2015-09-30 DIAGNOSIS — R87619 Unspecified abnormal cytological findings in specimens from cervix uteri: Secondary | ICD-10-CM

## 2015-09-30 HISTORY — DX: Malignant neoplasm of endometrium: C54.1

## 2015-09-30 HISTORY — DX: Unspecified abnormal cytological findings in specimens from cervix uteri: R87.619

## 2015-09-30 HISTORY — DX: Other specified disorders of bone density and structure, unspecified site: M85.80

## 2015-10-01 ENCOUNTER — Encounter: Payer: Self-pay | Admitting: Gynecology

## 2015-10-01 ENCOUNTER — Ambulatory Visit (INDEPENDENT_AMBULATORY_CARE_PROVIDER_SITE_OTHER): Payer: BLUE CROSS/BLUE SHIELD | Admitting: Gynecology

## 2015-10-01 VITALS — BP 118/76 | Ht 63.0 in | Wt 141.0 lb

## 2015-10-01 DIAGNOSIS — Z01419 Encounter for gynecological examination (general) (routine) without abnormal findings: Secondary | ICD-10-CM | POA: Diagnosis not present

## 2015-10-01 DIAGNOSIS — N950 Postmenopausal bleeding: Secondary | ICD-10-CM | POA: Diagnosis not present

## 2015-10-01 DIAGNOSIS — N952 Postmenopausal atrophic vaginitis: Secondary | ICD-10-CM

## 2015-10-01 NOTE — Progress Notes (Signed)
Jacqueline Hammond 1950/08/19 [id number]        64 y.o.  G2P2 new patient for annual exam.  Several issues noted below.  Past medical history,surgical history, problem list, medications, allergies, family history and social history were all reviewed and documented as reviewed in the EPIC chart.  ROS:  Performed with pertinent positives and negatives included in the history, assessment and plan.   Additional significant findings :  none   Exam: Caryn Bee assistant Filed Vitals:   10/01/15 1425  BP: 118/76  Height: 5\' 3"  (1.6 m)  Weight: 141 lb (63.957 kg)   General appearance:  Normal affect, orientation and appearance. Skin: Grossly normal HEENT: Without gross lesions.  No cervical or supraclavicular adenopathy. Thyroid normal.  Lungs:  Clear without wheezing, rales or rhonchi Cardiac: RR, without RMG Abdominal:  Soft, nontender, without masses, guarding, rebound, organomegaly or hernia Breasts:  Examined lying and sitting without masses, retractions, discharge or axillary adenopathy. Pelvic:  Ext/BUS/vagina with atrophic changes  Cervix with atrophic changes. Pap smear done  Uterus anteverted, normal size, shape and contour, midline and mobile nontender   Adnexa without masses or tenderness    Anus and perineum normal   Rectovaginal normal sphincter tone without palpated masses or tenderness.    Assessment/Plan:  65 y.o. G2P2 female for annual exam.   1. Postmenopausal/atrophic genital changes. Without significant hot flashes, night sweats, vaginal dryness. Patient has had several episodes of vaginal spotting over the past week.  History of spotting several years ago with evaluation which sounds like it was possibly a office hysteroscopy was reportedly negative. She is having no pain or other symptoms. Recommend sonohysterogram for endometrial assessment. Differential reviewed to include atrophic changes, polyps, hyperplasia, endometrial cancer. Patient will schedule follow up for  this. I reviewed with her what is involved with the test.  2. Mammography 11/2012. Patient knows she is overdue and agrees to schedule. Names and numbers given for her to call. SBE monthly reviewed. 3. Pap smear 2014. Pap smear done today. No history of abnormal Pap smears previously. 4. Colonoscopy 2013. Repeat at their recommended interval. 5. DEXA never. Recommend baseline DEXA now. She does have a history of smoking in the past. 6. Health maintenance. No routine blood work done as patient does this at her primary physician's office. Follow up for ultrasound and DEXA otherwise 1 year, sooner as needed.   Anastasio Auerbach MD, 2:57 PM 10/01/2015

## 2015-10-01 NOTE — Addendum Note (Signed)
Addended by: Nelva Nay on: 10/01/2015 03:05 PM   Modules accepted: Orders

## 2015-10-01 NOTE — Patient Instructions (Signed)
Follow up for the ultrasound as scheduled.  Follow up for the bone density as scheduled.  Call to Schedule your mammogram  Facilities in [place]: 1)  [facility]. [address] [place] Phone: [phone] 2)  Dr. Isaiah Blakes at [facility] [address] Phone: [phone]     Mammogram A mammogram is an X-ray test to find changes in a woman's breast. You should get a mammogram if:  You are 64 years of age or older  You have risk factors.   Your doctor recommends that you have one.  BEFORE THE TEST  Do not schedule the test the week before your period, especially if your breasts are sore during this time.  On the day of your mammogram:  Wash your breasts and armpits well. After washing, do not put on any deodorant or talcum powder on until after your test.   Eat and drink as you usually do.   Take your medicines as usual.   If you are diabetic and take insulin, make sure you:   Eat before coming for your test.   Take your insulin as usual.   If you cannot keep your appointment, call before the appointment to cancel. Schedule another appointment.  TEST  You will need to undress from the waist up. You will put on a hospital gown.   Your breast will be put on the mammogram machine, and it will press firmly on your breast with a piece of plastic called a compression paddle. This will make your breast flatter so that the machine can X-ray all parts of your breast.   Both breasts will be X-rayed. Each breast will be X-rayed from above and from the side. An X-ray might need to be taken again if the picture is not good enough.   The mammogram will last about 15 to 30 minutes.  AFTER THE TEST Finding out the results of your test Ask when your test results will be ready. Make sure you get your test results.  Document Released: 10/14/2008 Document Revised: 07/07/2011 Document Reviewed:  10/14/2008 [facility] Patient Information 2012 [place].

## 2015-10-02 ENCOUNTER — Encounter: Payer: Self-pay | Admitting: Gynecology

## 2015-10-02 LAB — PAP IG W/ RFLX HPV ASCU

## 2015-10-02 LAB — URINALYSIS W MICROSCOPIC + REFLEX CULTURE
BILIRUBIN URINE: NEGATIVE
CASTS: NONE SEEN [LPF]
Crystals: NONE SEEN [HPF]
Glucose, UA: NEGATIVE
KETONES UR: NEGATIVE
Nitrite: NEGATIVE
PROTEIN: NEGATIVE
Specific Gravity, Urine: 1.014 (ref 1.001–1.035)
Yeast: NONE SEEN [HPF]
pH: 6 (ref 5.0–8.0)

## 2015-10-03 LAB — URINE CULTURE

## 2015-10-05 ENCOUNTER — Other Ambulatory Visit: Payer: Self-pay

## 2015-10-06 ENCOUNTER — Other Ambulatory Visit: Payer: Self-pay | Admitting: Gynecology

## 2015-10-06 DIAGNOSIS — R3129 Other microscopic hematuria: Secondary | ICD-10-CM

## 2015-10-06 DIAGNOSIS — N950 Postmenopausal bleeding: Secondary | ICD-10-CM

## 2015-10-12 ENCOUNTER — Telehealth: Payer: Self-pay | Admitting: Gynecology

## 2015-10-12 NOTE — Telephone Encounter (Signed)
10/12/15-Pt was advised today that her [facility] will cover the sonohysterogram & bx with her $5.00 copay. Per Paul@ Bc-Ref#[id number]

## 2015-10-19 ENCOUNTER — Ambulatory Visit (INDEPENDENT_AMBULATORY_CARE_PROVIDER_SITE_OTHER): Payer: BLUE CROSS/BLUE SHIELD | Admitting: Gynecology

## 2015-10-19 ENCOUNTER — Ambulatory Visit (INDEPENDENT_AMBULATORY_CARE_PROVIDER_SITE_OTHER): Payer: BLUE CROSS/BLUE SHIELD

## 2015-10-19 ENCOUNTER — Other Ambulatory Visit: Payer: Self-pay | Admitting: Gynecology

## 2015-10-19 ENCOUNTER — Encounter: Payer: Self-pay | Admitting: Gynecology

## 2015-10-19 VITALS — BP 130/84

## 2015-10-19 DIAGNOSIS — N840 Polyp of corpus uteri: Secondary | ICD-10-CM

## 2015-10-19 DIAGNOSIS — N950 Postmenopausal bleeding: Secondary | ICD-10-CM

## 2015-10-19 DIAGNOSIS — N9489 Other specified conditions associated with female genital organs and menstrual cycle: Secondary | ICD-10-CM

## 2015-10-19 DIAGNOSIS — R938 Abnormal findings on diagnostic imaging of other specified body structures: Secondary | ICD-10-CM | POA: Diagnosis not present

## 2015-10-19 DIAGNOSIS — R9389 Abnormal findings on diagnostic imaging of other specified body structures: Secondary | ICD-10-CM

## 2015-10-19 DIAGNOSIS — R87619 Unspecified abnormal cytological findings in specimens from cervix uteri: Secondary | ICD-10-CM

## 2015-10-19 NOTE — Patient Instructions (Addendum)
Office will call you to schedule surgery Follow up for colposcopy appointment as scheduled.

## 2015-10-19 NOTE — Progress Notes (Addendum)
    Jacqueline Hammond 1951-05-04 [id number]        65 y.o.  G2P2 presents for sonohysterogram due to most recent Pap smear showing AGUS. No history of abnormal Pap smears previously.  Past medical history,surgical history, problem list, medications, allergies, family history and social history were all reviewed and documented in the EPIC chart.  Directed ROS with pertinent positives and negatives documented in the history of present illness/assessment and plan.  Exam: [place] assistant Filed Vitals:   10/19/15 1202  BP: 130/84   General appearance:  Normal External BUS vagina with atrophic changes. Cervix grossly normal. Uterus normal size midline mobile nontender. Adnexa without masses or tenderness.  Ultrasound shows uterus normal size and echotexture. Endometrial echo 6.8 mm. Right and left ovaries grossly normal with physiologic changes. Cul-de-sac negative.  Sonohysterogram performed, sterile technique, easy catheter introduction, good distention with 12 x 8 x 10 mm endometrial defect consistent with polyp best visualized transabdominally. Endometrial sample taken. Patient tolerated well. Patient did have a transient vasovagal reaction as evidenced by lightheadedness when she tried to sit up but this quickly resolved.  Assessment/Plan:  65 y.o. G2P2 with history of AGUS and sonohysterogram suggesting endometrial polyp. She has a colposcopy appointment scheduled this week. Will follow up for biopsy result and regardless schedule hysteroscopy D&C with resection of endometrial polyp. I had a lengthy discussion with the patient as far as the significance of AGUS and the need to rule out a significant lesion both endometrial and endocervical. I reviewed hysteroscopy D&C with her to include the expected intraoperative and postoperative courses as well as the recovery period. The use of the hysteroscope, resectoscope and the D&C portion were all discussed. The risks of surgery to include  infection, prolonged antibiotics, hemorrhage necessitating transfusion and the risks of transfusion, including transfusion reaction, hepatitis, HIV, mad cow disease and other unknown entities were all discussed understood and accepted. The risk of damage to internal organs during the procedure, either immediately recognized or delay recognized, including vagina, cervix, uterus, possible perforation causing damage to bowel, bladder, ureters, vessels and nerves necessitating major exploratory reparative surgery and future reparative surgeries including bladder repair, ureteral damage repair, bowel resection, ostomy formation was also discussed understood and accepted. The potential for distended media absorption leading to metabolic complications such as fluid overload, coma and seizures was also discussed understood and accepted. The patient's questions were answered to her satisfaction and we will proceed in scheduling her surgery.     Anastasio Auerbach MD, 1:04 PM 10/19/2015

## 2015-10-20 ENCOUNTER — Telehealth: Payer: Self-pay

## 2015-10-20 ENCOUNTER — Other Ambulatory Visit: Payer: BLUE CROSS/BLUE SHIELD

## 2015-10-20 NOTE — Telephone Encounter (Signed)
I called patient to discuss scheduled Hysteroscopy, D&C. We discussed her insurance benefits and her financial responsibility. She would like to plan on April 4th for surgery and I will proceed with scheduling that for her.

## 2015-10-20 NOTE — Telephone Encounter (Signed)
Not needed

## 2015-10-21 ENCOUNTER — Encounter: Payer: Self-pay | Admitting: Gynecology

## 2015-10-21 ENCOUNTER — Telehealth: Payer: Self-pay | Admitting: Gynecology

## 2015-10-21 ENCOUNTER — Telehealth: Payer: Self-pay | Admitting: *Deleted

## 2015-10-21 NOTE — Telephone Encounter (Signed)
-----   Message from Ramond Craver, [place] sent at 10/21/2015  9:05 AM EDT ----- Regarding:  Referral to [place] I cancelled the appointments he mentioned.  Thanks! ----- Message -----    From: Anastasio Auerbach, MD    Sent: 10/21/2015   8:56 AM      To: Ramond Craver, RMA  Juliann Pulse, I spoke with the patient and gave her the diagnosis of adenocarcinoma the endometrium. Okay to cancel her hysteroscopy D&C and colposcopy appointments with me. Arrange GYN oncology appointment

## 2015-10-21 NOTE — Telephone Encounter (Signed)
I called the patient today with the biopsy results from her sonohysterogram which showed endometrial adenocarcinoma. I reviewed the diagnosis and in general discussed the usual treatments with this type of cancer to include TAH/BSO surgical staging with lymph node sampling and possible postoperative treatments including radiation. Will arrange appointment with gynecologic oncologist and the patient knows that they will be assuming care. She knows to call me at any time if she has questions about what's going on or if she does not hear from the office to arrange an appointment within the next several days.

## 2015-10-21 NOTE — Telephone Encounter (Signed)
Appointment on 10/23/15 @ 9:45am with Dr.Clarke-Pearson pt aware.

## 2015-10-23 ENCOUNTER — Ambulatory Visit: Payer: BLUE CROSS/BLUE SHIELD | Admitting: Gynecology

## 2015-10-23 ENCOUNTER — Telehealth: Payer: Self-pay | Admitting: Gynecologic Oncology

## 2015-10-23 ENCOUNTER — Other Ambulatory Visit (HOSPITAL_BASED_OUTPATIENT_CLINIC_OR_DEPARTMENT_OTHER): Payer: BLUE CROSS/BLUE SHIELD

## 2015-10-23 ENCOUNTER — Ambulatory Visit: Payer: BLUE CROSS/BLUE SHIELD | Attending: Gynecology | Admitting: Gynecology

## 2015-10-23 ENCOUNTER — Encounter: Payer: Self-pay | Admitting: Gynecology

## 2015-10-23 VITALS — BP 135/88 | HR 77 | Temp 98.2°F | Resp 18 | Ht 63.0 in | Wt 142.0 lb

## 2015-10-23 DIAGNOSIS — E785 Hyperlipidemia, unspecified: Secondary | ICD-10-CM | POA: Diagnosis not present

## 2015-10-23 DIAGNOSIS — F17210 Nicotine dependence, cigarettes, uncomplicated: Secondary | ICD-10-CM | POA: Diagnosis not present

## 2015-10-23 DIAGNOSIS — Z1504 Genetic susceptibility to malignant neoplasm of endometrium: Secondary | ICD-10-CM | POA: Diagnosis not present

## 2015-10-23 DIAGNOSIS — C541 Malignant neoplasm of endometrium: Secondary | ICD-10-CM | POA: Insufficient documentation

## 2015-10-23 LAB — BASIC METABOLIC PANEL
Anion Gap: 8 mEq/L (ref 3–11)
BUN: 10.4 mg/dL (ref 7.0–26.0)
CHLORIDE: 105 meq/L (ref 98–109)
CO2: 27 meq/L (ref 22–29)
CREATININE: 1.1 mg/dL (ref 0.6–1.1)
Calcium: 9.8 mg/dL (ref 8.4–10.4)
EGFR: 54 mL/min/{1.73_m2} — ABNORMAL LOW (ref >90)
Glucose: 96 mg/dl (ref 70–140)
Potassium: 4.1 mEq/L (ref 3.5–5.1)
Sodium: 140 mEq/L (ref 136–145)

## 2015-10-23 NOTE — Progress Notes (Signed)
Consult Note: Gyn-Onc   Jacqueline Hammond 65 y.o. female  Chief Complaint  Patient presents with  . endometrial cancer    New Consultation    Assessment :High-grade endometrial carcinoma with serous features.  Plan natural history of the disease was discussed the patient and her husband. We recommend primary therapy with a robotic-assisted hysterectomy, bilateral salpingo-oophorectomy, and lymphadenectomy. The risks of surgery including hemorrhage, infection, injury to adjacent viscera, and anesthetic risks were reviewed. All questions are answered. We will obtain a CT scan of the chest abdomen and pelvis given the high-grade nature of her endometrial cancer.  The patient would like to have surgery done as soon as possible. Our first available date in [place] is April 20. Therefore we have scheduled her have surgery at [facility]  HPI: 65 year old white married female gravida 2 seen in consultation request of Dr. Abbie Hammond regarding management of a newly diagnosed endometrial carcinoma. The patient initially had some spotting and a Pap smear revealing atypical glandular cells. She underwent sonohysterogram and endometrial biopsy on 10/19/2015 revealing an endometrial adenocarcinoma with serous features (high-grade) the patient is currently not having any bleeding and has no pelvic symptoms. The uterus measured 7.4 x 3.5 x 3.0 cm with an endometrial stripe of 6.8 mm.  She has no significant past gynecologic history although she had a tubal ligation through a Pfannenstiel incision. Subsequently she underwent diagnostic laparoscopy and lysis of adhesions for pelvic pain which is now resolved. She claims her Pap smears the past and been normal. She has no other significant medical history.  Review of Systems:10 point review of systems is negative except as noted in interval history.   Vitals: Blood pressure 135/88, pulse 77, temperature 98.2 F (36.8 C), temperature source Oral, resp. rate 18, height  5\' 3"  (1.6 m), weight 142 lb (64.411 kg), SpO2 100 %.  Physical Exam: General : The patient is a healthy woman in no acute distress.  HEENT: normocephalic, extraoccular movements normal; neck is supple without thyromegally  Lynphnodes: Supraclavicular and inguinal nodes not enlarged  Abdomen: Soft, non-tender, no ascites, no organomegally, no masses, no hernias , well-healed Pfannenstiel incision. Pelvic:  EGBUS: Normal female  Vagina: Normal, no lesions  Urethra and Bladder: Normal, non-tender  Cervix: Normal there are no lesions. Uterus: Anterior normal shape size and consistency very mobile. Bi-manual examination: Non-tender; no adenxal masses or nodularity  Rectal: normal sphincter tone, no masses, no blood  Lower extremities: No edema or varicosities. Normal range of motion      Allergies  Allergen Reactions  . Codeine Sulfate     REACTION: nausea, pass out  . Neomycin-Bacitracin Zn-Polymyx     REACTION: redness    Past Medical History  Diagnosis Date  . Hyperlipidemia   . Atypical glandular cells of undetermined significance (AGUS) on cervical Pap smear 09/2015  . Adenocarcinoma of endometrium ([place]) 09/2015    Past Surgical History  Procedure Laterality Date  . Tubal ligation    . Laparoscopy      Current Outpatient Prescriptions  Medication Sig Dispense Refill  . atorvastatin (LIPITOR) 20 MG tablet TAKE 1 TABLET BY MOUTH EVERY DAY 30 tablet 5  . etodolac (LODINE) 400 MG tablet TAKE 1 TABLET(400 MG) BY MOUTH TWICE DAILY (Patient not taking: Reported on 10/01/2015) 40 tablet 0   No current facility-administered medications for this visit.    Social History   Social History  . Marital Status: Married    Spouse Name: N/A  . Number of Children: N/A  .  Years of Education: N/A   Occupational History  . Not on file.   Social History Main Topics  . Smoking status: Light Tobacco Smoker -- 0.25 packs/day for 32 years    Types: Cigarettes    Last Attempt to Quit:  08/01/2006  . Smokeless tobacco: Not on file  . Alcohol Use: No  . Drug Use: No  . Sexual Activity: Yes     Comment: 1st intercourse 89 yo-1 partner   Other Topics Concern  . Not on file   Social History Narrative   Married 1970 (Jacqueline Hammond is a patient here). 2 Hammond. 3 grandchildren. 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 Heart failure Father   . Heart attack Father 23    smoker, war vet  . Heart attack Brother 70  . Melanoma Father   . Cancer Mother     larynx      Jacqueline Chapel, MD 10/23/2015, 10:18 AM

## 2015-10-23 NOTE — Patient Instructions (Addendum)
Plan to have a CT scan of the chest, abdomen, and pelvis prior to surgery at [facility].  You will also go to [facility] for a pre-operative appointment.  We will call you today with a date for surgery at [facility].

## 2015-10-23 NOTE — Telephone Encounter (Signed)
Called to inform the patient of her surgical date at [facility] for April 6 with Dr. Olena Heckle with pre-op on April 3 at 11:45am.  Verbalizing understanding.  Advised to call for any needs.

## 2015-10-27 ENCOUNTER — Ambulatory Visit (INDEPENDENT_AMBULATORY_CARE_PROVIDER_SITE_OTHER): Payer: BLUE CROSS/BLUE SHIELD

## 2015-10-27 ENCOUNTER — Encounter: Payer: Self-pay | Admitting: Gynecology

## 2015-10-27 ENCOUNTER — Other Ambulatory Visit: Payer: Self-pay | Admitting: Gynecology

## 2015-10-27 DIAGNOSIS — Z13820 Encounter for screening for osteoporosis: Secondary | ICD-10-CM

## 2015-10-27 DIAGNOSIS — M899 Disorder of bone, unspecified: Secondary | ICD-10-CM

## 2015-10-27 DIAGNOSIS — M8580 Other specified disorders of bone density and structure, unspecified site: Secondary | ICD-10-CM

## 2015-10-27 DIAGNOSIS — Z01419 Encounter for gynecological examination (general) (routine) without abnormal findings: Secondary | ICD-10-CM

## 2015-10-28 ENCOUNTER — Ambulatory Visit (HOSPITAL_COMMUNITY)
Admission: RE | Admit: 2015-10-28 | Discharge: 2015-10-28 | Disposition: A | Discharge status: Home / Self Care | Payer: BLUE CROSS/BLUE SHIELD | Source: Ambulatory Visit | Attending: Gynecologic Oncology | Admitting: Gynecologic Oncology

## 2015-10-28 DIAGNOSIS — I77811 Abdominal aortic ectasia: Secondary | ICD-10-CM | POA: Diagnosis not present

## 2015-10-28 DIAGNOSIS — C541 Malignant neoplasm of endometrium: Secondary | ICD-10-CM

## 2015-10-28 MED ORDER — IOPAMIDOL (ISOVUE-300) INJECTION 61%
75.0000 mL | Freq: Once | INTRAVENOUS | Status: AC | PRN
  Administered 2015-10-28: 75 mL via INTRAVENOUS

## 2015-10-29 HISTORY — PX: ROBOTIC ASSISTED TOTAL HYSTERECTOMY WITH BILATERAL SALPINGO OOPHERECTOMY: SHX6086

## 2015-10-30 ENCOUNTER — Telehealth: Payer: Self-pay | Admitting: Gynecologic Oncology

## 2015-10-30 NOTE — Telephone Encounter (Signed)
Left message for patient about CT scan results.

## 2015-11-05 ENCOUNTER — Other Ambulatory Visit: Payer: Self-pay | Admitting: Gynecologic Oncology

## 2015-11-05 ENCOUNTER — Telehealth: Payer: Self-pay | Admitting: Gynecologic Oncology

## 2015-11-05 DIAGNOSIS — C541 Malignant neoplasm of endometrium: Secondary | ICD-10-CM

## 2015-11-05 NOTE — Telephone Encounter (Signed)
Returned call to patient.  Patient calling about her recommendations from [facility] for adjuvant chemotherapy and radiation.  All questions answered.  Referrals made.  Plan for sandwich therapy per Dr. Lillia Dallas at [facility].

## 2015-11-05 NOTE — Telephone Encounter (Signed)
Patient informed of all upcoming appointments related to visit with [facility] and appt with Denman George on 4/10.  No concerns voiced.  Advised to contact our office for any needs.

## 2015-11-09 ENCOUNTER — Encounter: Payer: Self-pay | Admitting: Gynecologic Oncology

## 2015-11-09 ENCOUNTER — Ambulatory Visit: Payer: BLUE CROSS/BLUE SHIELD | Attending: Gynecologic Oncology | Admitting: Gynecologic Oncology

## 2015-11-09 VITALS — BP 145/88 | HR 83 | Temp 97.7°F | Resp 18 | Ht 63.0 in | Wt 131.7 lb

## 2015-11-09 DIAGNOSIS — C541 Malignant neoplasm of endometrium: Secondary | ICD-10-CM | POA: Insufficient documentation

## 2015-11-09 DIAGNOSIS — C775 Secondary and unspecified malignant neoplasm of intrapelvic lymph nodes: Secondary | ICD-10-CM | POA: Diagnosis not present

## 2015-11-09 DIAGNOSIS — Z8542 Personal history of malignant neoplasm of other parts of uterus: Secondary | ICD-10-CM | POA: Insufficient documentation

## 2015-11-09 DIAGNOSIS — M8580 Other specified disorders of bone density and structure, unspecified site: Secondary | ICD-10-CM | POA: Diagnosis not present

## 2015-11-09 DIAGNOSIS — F17210 Nicotine dependence, cigarettes, uncomplicated: Secondary | ICD-10-CM | POA: Insufficient documentation

## 2015-11-09 DIAGNOSIS — Z808 Family history of malignant neoplasm of other organs or systems: Secondary | ICD-10-CM | POA: Diagnosis not present

## 2015-11-09 DIAGNOSIS — C7982 Secondary malignant neoplasm of genital organs: Secondary | ICD-10-CM | POA: Insufficient documentation

## 2015-11-09 DIAGNOSIS — Z79899 Other long term (current) drug therapy: Secondary | ICD-10-CM | POA: Insufficient documentation

## 2015-11-09 DIAGNOSIS — E785 Hyperlipidemia, unspecified: Secondary | ICD-10-CM | POA: Diagnosis not present

## 2015-11-09 NOTE — Patient Instructions (Signed)
Plan to meet with Dr. Marko Plume on [place].  Plan to follow up with GYN ONC after completion of chemo and radiation.

## 2015-11-09 NOTE — Progress Notes (Signed)
Follow-up Note: Gyn-Onc   Jacqueline Hammond 65 y.o. female  Chief Complaint  Patient presents with  . endometrial cancer    MD follow up visit    Assessment :Stage IIIC1 (with ITC's) serous high grade endometrial adenocarcioma. Cervical stromal involvement.  Plan   I discussed her pathology and surgical findings. I discussed the prognosis of this cancer. I discussed the uncertainty of the clinical significance of ITC's in sentinel lymph nodes in endometrial cancer. I discussed that serous carcinoma has a high risk for recurrence both at distant sites and local pelvic sites (particularly in the setting of a tumor infiltrating the cervix), therefore we recommend combination adjuvant therapy in accordance with NCCN guidelines. This would include both chemotherapy (6 cycles of carboplatin and paclitaxel) and radiation (external beam and vaginal brachytherapy). Due to the importance in this cell type of controlling distant sites first, we will initiate with chemotherapy for 3 cycles then administer the treatment in a sandwich fashion.  I discussed anticipated toxicities. I discussed the risk for recurrence in the future even with complete response to initial treatment and the need for longterm follow-up.  She is medically cleared for adjuvant therapy.  HPI: 65 year old white married female gravida 2 seen in consultation request of Dr. Abbie Hammond regarding management of a newly diagnosed endometrial carcinoma. The patient initially had some spotting and a Pap smear revealing atypical glandular cells. She underwent sonohysterogram and endometrial biopsy on 10/19/2015 revealing an endometrial adenocarcinoma with serous features (high-grade) the patient is currently not having any bleeding and has no pelvic symptoms. The uterus measured 7.4 x 3.5 x 3.0 cm with an endometrial stripe of 6.8 mm.  She has no significant past gynecologic history although she had a tubal ligation through a Pfannenstiel  incision. Subsequently she underwent diagnostic laparoscopy and lysis of adhesions for pelvic pain which is now resolved. She claims her Pap smears the past and been normal. She has no other significant medical history.  Interval Hx: on 10/29/15 she underwent a robotic hysterectomy, BSO and sentinel lymph node biopsy of pelvic and PA nodes at [facility] with Dr Jacqueline Hammond. Surgery was uncomplicated. Postoperative pathology revealed a 33mm tumor of high grade serous adenocarcinoma involving the cervical stroma with no LVSI. There was outer half myometrial infiltration (12 of 75mm). The left and right pelvic SLN's were positive for ITC's on IHC.  Postoperatively she has done very well with no acute issues. She is eating well. Has no pain. No LE edema. No genitofemoral nerve dysfunction. She is anxious to start adjuvant therapy  Review of Systems:10 point review of systems is negative except as noted in interval history.   Vitals: Blood pressure 145/88, pulse 83, temperature 97.7 F (36.5 C), temperature source Oral, resp. rate 18, height 5\' 3"  (1.6 m), weight 131 lb 11.2 oz (59.739 kg), SpO2 100 %.  Physical Exam: General : The patient is a healthy woman in no acute distress.  HEENT: normocephalic, extraoccular movements normal; neck is supple without thyromegally  Lynphnodes: Supraclavicular and inguinal nodes not enlarged  Abdomen: Soft, non-tender, no ascites, no organomegally, no masses, no hernias , well-healed laparoscopic incisions. Pelvic:  EGBUS: Normal female  Vagina: vaginal cuff in tact, free of cellulitis or blood. Healing well.  Urethra and Bladder: Normal, non-tender  Cervix: surgically absent Uterus: surgically absent Bi-manual examination: Non-tender; no masses Rectal: deferred Lower extremities: No edema or varicosities. Normal range of motion      Allergies  Allergen Reactions  . Codeine Sulfate  REACTION: nausea, pass out  . Neomycin-Bacitracin Zn-Polymyx     REACTION:  redness    Past Medical History  Diagnosis Date  . Hyperlipidemia   . Atypical glandular cells of undetermined significance (AGUS) on cervical Pap smear 09/2015  . Adenocarcinoma of endometrium ([place]) 09/2015  . Osteopenia 09/2015    T score -1.4 FRAX 8.5%/0.8%    Past Surgical History  Procedure Laterality Date  . Tubal ligation    . Laparoscopy      Current Outpatient Prescriptions  Medication Sig Dispense Refill  . atorvastatin (LIPITOR) 20 MG tablet TAKE 1 TABLET BY MOUTH EVERY DAY (Patient taking differently: TAKE 1 TABLET BY MOUTH EVERY OTHER DAY) 30 tablet 5  . docusate sodium (COLACE) 100 MG capsule Take 100 mg by mouth.    . polyethylene glycol (MIRALAX / GLYCOLAX) packet Take by mouth.     No current facility-administered medications for this visit.    Social History   Social History  . Marital Status: Married    Spouse Name: N/A  . Number of Children: N/A  . Years of Education: N/A   Occupational History  . Not on file.   Social History Main Topics  . Smoking status: Light Tobacco Smoker -- 0.25 packs/day for 32 years    Types: Cigarettes    Last Attempt to Quit: 08/01/2006  . Smokeless tobacco: Not on file  . Alcohol Use: No  . Drug Use: No  . Sexual Activity: Yes     Comment: 1st intercourse 76 yo-1 partner   Other Topics Concern  . Not on file   Social History Narrative   Married 1970 (Jacqueline Hammond is a patient here). 2 Hammond. 3 grandchildren. 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 Heart failure Father   . Heart attack Father 74    smoker, war vet  . Heart attack Brother 62  . Melanoma Father   . Cancer Mother     larynx     30 minutes of direct face to face counseling time was spent with  the patient. This included discussion about prognosis, therapy recommendations and postoperative side effects and are beyond the scope of routine postoperative care.   Donaciano Eva, MD 11/09/2015, 11:54 AM

## 2015-11-12 ENCOUNTER — Other Ambulatory Visit: Payer: BLUE CROSS/BLUE SHIELD

## 2015-11-12 ENCOUNTER — Other Ambulatory Visit: Payer: Self-pay | Admitting: Oncology

## 2015-11-12 ENCOUNTER — Encounter: Payer: Self-pay | Admitting: *Deleted

## 2015-11-12 ENCOUNTER — Ambulatory Visit (HOSPITAL_BASED_OUTPATIENT_CLINIC_OR_DEPARTMENT_OTHER): Payer: BLUE CROSS/BLUE SHIELD | Admitting: Oncology

## 2015-11-12 ENCOUNTER — Other Ambulatory Visit (HOSPITAL_BASED_OUTPATIENT_CLINIC_OR_DEPARTMENT_OTHER): Payer: BLUE CROSS/BLUE SHIELD

## 2015-11-12 VITALS — BP 145/81 | HR 100 | Temp 97.8°F | Resp 18 | Ht 63.0 in | Wt 138.9 lb

## 2015-11-12 DIAGNOSIS — Z720 Tobacco use: Secondary | ICD-10-CM

## 2015-11-12 DIAGNOSIS — D7589 Other specified diseases of blood and blood-forming organs: Secondary | ICD-10-CM

## 2015-11-12 DIAGNOSIS — C541 Malignant neoplasm of endometrium: Secondary | ICD-10-CM | POA: Diagnosis not present

## 2015-11-12 DIAGNOSIS — Z86010 Personal history of colonic polyps: Secondary | ICD-10-CM

## 2015-11-12 DIAGNOSIS — Z808 Family history of malignant neoplasm of other organs or systems: Secondary | ICD-10-CM

## 2015-11-12 DIAGNOSIS — M8580 Other specified disorders of bone density and structure, unspecified site: Secondary | ICD-10-CM | POA: Diagnosis not present

## 2015-11-12 DIAGNOSIS — D72829 Elevated white blood cell count, unspecified: Secondary | ICD-10-CM | POA: Diagnosis not present

## 2015-11-12 DIAGNOSIS — I77811 Abdominal aortic ectasia: Secondary | ICD-10-CM

## 2015-11-12 LAB — COMPREHENSIVE METABOLIC PANEL
ALT: 9 U/L (ref 0–55)
AST: 13 U/L (ref 5–34)
Albumin: 3.5 g/dL (ref 3.5–5.0)
Alkaline Phosphatase: 107 U/L (ref 40–150)
Anion Gap: 9 mEq/L (ref 3–11)
BUN: 12.9 mg/dL (ref 7.0–26.0)
CALCIUM: 10 mg/dL (ref 8.4–10.4)
CHLORIDE: 103 meq/L (ref 98–109)
CO2: 26 mEq/L (ref 22–29)
Creatinine: 0.9 mg/dL (ref 0.6–1.1)
EGFR: 70 mL/min/{1.73_m2} — ABNORMAL LOW (ref >90)
GLUCOSE: 130 mg/dL (ref 70–140)
POTASSIUM: 3.7 meq/L (ref 3.5–5.1)
SODIUM: 139 meq/L (ref 136–145)
Total Bilirubin: <0.3 mg/dL (ref 0.20–1.20)
Total Protein: 7.2 g/dL (ref 6.4–8.3)

## 2015-11-12 LAB — CBC WITH DIFFERENTIAL/PLATELET
BASO%: 0.5 % (ref 0.0–2.0)
BASOS ABS: 0.1 10*3/uL (ref 0.0–0.1)
EOS%: 1.5 % (ref 0.0–7.0)
Eosinophils Absolute: 0.2 10*3/uL (ref 0.0–0.5)
HCT: 39.9 % (ref 34.8–46.6)
HGB: 13.1 g/dL (ref 11.6–15.9)
LYMPH%: 21.1 % (ref 14.0–49.7)
MCH: 33.7 pg (ref 25.1–34.0)
MCHC: 32.9 g/dL (ref 31.5–36.0)
MCV: 102.4 fL — ABNORMAL HIGH (ref 79.5–101.0)
MONO#: 0.6 10*3/uL (ref 0.1–0.9)
MONO%: 4.9 % (ref 0.0–14.0)
NEUT#: 8.3 10*3/uL — ABNORMAL HIGH (ref 1.5–6.5)
NEUT%: 72 % (ref 38.4–76.8)
Platelets: 299 10*3/uL (ref 145–400)
RBC: 3.9 10*6/uL (ref 3.70–5.45)
RDW: 13.2 % (ref 11.2–14.5)
WBC: 11.5 10*3/uL — ABNORMAL HIGH (ref 3.9–10.3)
lymph#: 2.4 10*3/uL (ref 0.9–3.3)

## 2015-11-12 MED ORDER — DEXAMETHASONE 4 MG PO TABS: ORAL_TABLET | ORAL | Status: DC

## 2015-11-12 MED ORDER — LORAZEPAM 0.5 MG PO TABS: ORAL_TABLET | ORAL | Status: DC

## 2015-11-12 MED ORDER — ONDANSETRON HCL 8 MG PO TABS: 8.0000 mg | ORAL_TABLET | Freq: Three times a day (TID) | ORAL | Status: DC | PRN

## 2015-11-12 NOTE — Progress Notes (Signed)
[place] NEW PATIENT EVALUATION   Name: Jacqueline Hammond Date: November 13, 2015  MRN: [id number] DOB: [birth year]  REFERRING PHYSICIAN: Terrence Dupont Rossi/ Cindie Laroche cc Yong Channel, Brayton Mars, MD (PCP, Abbie Sons, Emilie Rutter. New patient consultation with Dr Gery Pray 12-16-15.    REASON FOR REFERRAL: endometrial carcinoma for adjuvant therapy  Outside records from [facility] reviewed by MD.   HISTORY OF PRESENT ILLNESS:Jacqueline Hammond is a 65 y.o. 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 Recommendation from [facility] multidisciplinary gyn oncology  conference is for chemotherapy given in sandwich fashion with radiation.   Patient presented to Dr Phineas Real as new patient 10-01-15 after noticing recent slight vaginal spotting. PAP had atypical glandular cells, then sonohysterogram and endometrial biopsy on 10-19-15. The uterus was 7.4 x 3.5 x 3.0 cm with endometrial stripe of 6.8 mm.  Endometrial biopsy ([id number] from [facility]) showed endometrial carcinoma which appeared high grade with serous features. She was seen by Dr Josephina Shih on 10-23-15, exam not remarkable. She had CT CAP 10-28-15 with no evidence of metastatic disease, incidental atherosclerosis and ectatic abdominal aorta. She had surgery at [facility] by Dr Cindie Laroche on 10-29-15, which was robotic hysterectomy BSO with pelvic and paraaortic sentinel node evaluation. Intraoperative findings were of small uterus with normal tubes and ovaries. Adhesions of bladder due to uterine fundus from prior surgery, bilateral pelvic sentinel pelvic mapping to left obturator space node and to right external iliac node, 3 right paraaortic nodes mapped as sentinel nodes, normal upper abdominal survey. Pathology [phone] had high grade serous  adenocarcinoma of uterus involving cervical stroma and outer half of myometrium,  isolated tumor cells in 2/2 right external iliac and 2/2 left obturator nodes, negative right and left periaortics, bilateral tubes and ovaries benign. Immunohistochemical stains confirmed serous carcinoma, ER and PR negative. [facility] labs 10-27-15 (preoperative) included CA 125 of 12.1, WBC 10.5, Hgb 14.5 and platelets 290k. [facility] gyn oncology multidisciplinary conference 11-04-15 recommended 6 cycles carboplatin taxol given in sandwich fashion with pelvic radiation and vaginal brachytherapy. Patient was DC home day after surgery, no prophylactic anticoagulation at DC.  She has been recovering well from surgery after initial constipation resolved. She saw Dr Denman George on 11-09-15 with discussion of pathology findings and recommendations for adjuvant treatment. She and family attended chemotherapy education class prior to visit today.    REVIEW OF SYSTEMS: Weight at baseline. Appetite better since constipation resolved, colace and miralax helpful with that. No pain, has not needed pain medication at home. No fever. No urinary symptoms. Localized area of numbness upper anterior left thigh since surgery not bothersome. Up and resuming regular activities at home, tho has not gone back to 3x weekly zumba classes or resumed usual walking 2-3 miles daily. No LE swelling or tenderness. No SOB or chest pain. Otherwise: not prone to HA. Occasional nausea in past, diet related GERD uses OTC ranitidine occasionally. Good visual acuity with glasses. No significant environmental allergies. No actie dental problems and does have dentist. No other bleeding. No arthritis. No cardiac symptoms. No history of thyroid disease. No peripheral neuropathy.   Remainder of full 10 point review of systems negative.   ALLERGIES: Codeine sulfate and Neomycin-bacitracin zn-polymyx  PAST MEDICAL/ SURGICAL HISTORY:    G2 BTL Diagnostic laparoscopy with lysis of  adhesions after BTL  elevated liipids Osteopenia 09-2015, T score -1.4 Mammograms 11-2012 [facility]oscopy 06-2008 with 4 polyps, Dr Henrene Pastor. Flu vaccine 07-14-15 B12 and folate normal 09-2014 EKG NSR/ normal  10-27-15 [facility]  CURRENT MEDICATIONS: reviewed as listed now in EMR. Scripts for decadron 20 mg 12 hrs and 6 hrs prior to taxol, zofran, ativan. Fine to use colace and/or miralax if needed with chemo   SOCIAL HISTORY:  Married,originally from [place] and lives with husband in [place]. 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 Continues to smoke ~ 2-3 cigarettes daily, has quit multiple times in past, smoked x >30 years. 2 sons and 2 grands locally, 1 granddaughter in [place]. No ETOH. Has done regular zumba classes [facility] [employer] x several years.   FAMILY HISTORY:  Father with melanoma, DM, possibly MI age 41 Mother "throat cancer" Brother MI age 21, CABG Sister healthy 40 healthy           PHYSICAL EXAM:  height is _0  (1.6 m) and weight is 138 lb 14.4 oz (63.005 kg). Her oral temperature is 97.8 F (36.6 C). Her blood pressure is 145/81 and her pulse is 100. Her respiration is 18 and oxygen saturation is 100%.  Alert, pleasant, cooperative lady appears stated age. Mildly uncomfortable when seated in exam room. Good historian, family very supportive.   HEENT: normal hair pattern. PERRL, not icteric. Oral mucosa moist and clear, unremarkable dentition, posterior pharynx clear. Neck supple, no JVD or thyroid mass  RESPIRATORY:respirations not labored RA. Lungs with somewhat diminished BS bilaterally, no wheezes or crackles, no dullness to percussion.   CARDIAC/ VASCULAR: heart RRR no gallop, clear heart sounds. Peripheral pulses intact and symmetrical  ABDOMEN: soft, not tender, not obviously distended. Surgical incisions with glue intact, appear to be healing well. Normal bowel  sounds. No HSM or mass  LYMPH NODES: no cervical, supraclavicular, axillary or inguinal adenopathy  BREASTS: bilaterally without dominant mass, skin changes or nipple findings. Axillae benign  NEUROLOGIC: speech fluent. No focal deficits CN, motor, cerebellar, sensory other than localized decreased light touch anterior upper left thigh. PSYCH appropriate mood and affect  SKIN: without rash, ecchymosis, petechiae  MUSCULOSKELETAL: symmetrical muscle mass. Back not tender. LE no edema., cords, tenderness    LABORATORY DATA:  Results for orders placed or performed in visit on 11/12/15 (from the past 48 hour(s))  CBC with Differential     Status: Abnormal   Collection Time: 11/12/15  2:59 PM  Result Value Ref Range   WBC 11.5 (H) 3.9 - 10.3 10e3/uL   NEUT# 8.3 (H) 1.5 - 6.5 10e3/uL   HGB 13.1 11.6 - 15.9 g/dL   HCT 39.9 34.8 - 46.6 %   Platelets 299 145 - 400 10e3/uL   MCV 102.4 (H) 79.5 - 101.0 fL   MCH 33.7 25.1 - 34.0 pg   MCHC 32.9 31.5 - 36.0 g/dL   RBC 3.90 3.70 - 5.45 10e6/uL   RDW 13.2 11.2 - 14.5 %   lymph# 2.4 0.9 - 3.3 10e3/uL   MONO# 0.6 0.1 - 0.9 10e3/uL   Eosinophils Absolute 0.2 0.0 - 0.5 10e3/uL   Basophils Absolute 0.1 0.0 - 0.1 10e3/uL   NEUT% 72.0 38.4 - 76.8 %   LYMPH% 21.1 14.0 - 49.7 %   MONO% 4.9 0.0 - 14.0 %   EOS% 1.5 0.0 - 7.0 %   BASO% 0.5 0.0 - 2.0 %  Comprehensive metabolic panel     Status: Abnormal   Collection Time: 11/12/15  2:59 PM  Result Value Ref Range   Sodium 139 136 - 145 mEq/L   Potassium 3.7 3.5 - 5.1 mEq/L   Chloride 103 98 - 109 mEq/L   CO2 26 22 - 29 mEq/L   Glucose 130 70 - 140 mg/dl    Comment: Glucose reference range is for nonfasting patients. Fasting glucose reference range is 70- 100.   BUN 12.9 7.0 - 26.0 mg/dL   Creatinine 0.9 0.6 - 1.1 mg/dL   Total Bilirubin <0.30 0.20 - 1.20 mg/dL   Alkaline Phosphatase 107 40 - 150 U/L   AST 13 5 - 34 U/L   ALT 9 0 - 55 U/L   Total Protein 7.2 6.4 - 8.3 g/dL   Albumin 3.5 3.5 -  5.0 g/dL   Calcium 10.0 8.4 - 10.4 mg/dL   Anion Gap 9 3 - 11 mEq/L   EGFR 70 (L) >90 ml/min/1.73 m2    Comment: eGFR is calculated using the CKD-EPI Creatinine Equation (2009)      PATHOLOGY:  Endometrial biopsy Patient: NAEEMAH, JASMER Collected: 10/19/2015 Client: [facility] Accession: [id number] Received: 10/19/2015 Donalynn Furlong, MD DOB: Oct 19, 1950 Age: 36 Gender: F Reported: 10/20/2015 [address] Patient Ph: [phone] MRN #: [id number] [place], [place] [address] Client Acc#: Chart #: [id number] Phone: [phone] Fax: CC: REPORT OF SURGICAL PATHOLOGY FINAL DIAGNOSIS Diagnosis Endometrium, biopsy, uterus - POSITIVE FOR ENDOMETRIAL ADENOCARCINOMA. - SEE COMMENT. Microscopic Comment Although grading and typing of endometrial carcinoma is best performed upon excision specimen, as sampled, the endometrial adenocarcinoma shows serous features and is considered high grade.    Surgical pathology exam  10/29/2015  [facility]  Component Name Value Range  Case Report Surgical Pathology Report                         Case: [id number]                                Authorizing Provider:  Christella Hartigan, MD  Collected:           10/29/2015                                               Specimens:   A) - Lymph Node, right peri-aortic lymph node                                                      B) - Lymph Node, right peri-aortic sentinel lymph node                                             C) - Lymph Node, left peri-aortic sentinel lymph node  D) - Lymph Node, right external iliac sentinel lymph node                                          E) - Lymph Node, left obturator sentinel lymph node                                                F) - Uterus, uterus, cervix, bilateral tubes and bilateral ovaries                      Final Diagnosis A: Lymph node, right periaortic, excision  -  Negative for metastatic carcinoma by H&E stain (0/1)  B: Lymph node, right periaortic, sentinel, biopsy  - Negative for metastatic carcinoma by H&E and keratin stain (0/3)  C: Lymph node, left periaortic, sentinel, biopsy  - Negative for metastatic carcinoma by H&E and keratin stain (0/2)  D: Lymph node, right external iliac, sentinel, biopsy  - Isolated tumor cells present in 2 of 2 lymph nodes sampled on keratin stain only  E: Lymph node, left obturator, sentinel, biopsy  - Isolated tumor cells present in 2 of 2 lymph nodes sampled on H&E and keratin stain   F: Uterus, cervix, bilateral fallopian tubes and ovaries, robotic assisted total laparoscopic hysterectomy and bilateral salpingo-oophorectomy  - High-grade serous adenocarcinoma (pT2) - Tumor involves cervical stroma and outer half of myometrium - See comment and synoptic report for further details  Cervix: - Involved by serous carcinoma - Benign squamous mucosa and endocervical glands  Endomyometrium: - Involved by serous carcinoma - Background inactive endometrium - No myometrial lesions or nodules  Left ovary: - Benign ovarian parenchyma with inclusion cysts - No atypia or malignancy identified  Left fallopian tube: - Benign fallopian tube with mesonephric remnants - No atypia or malignancy identified  Right ovary: - Benign ovarian parenchyma with inclusion cysts - No atypia or malignancy identified  Right fallopian tube:  - Benign fallopian tube with mesonephric remnants - No atypia or malignancy identified   Synoptic Report ENDOMETRIUM: Hysterectomy, With or Without Other Organs or Tissues  (Endometrium - F)      Specimen:    Uterine corpus  SPECIMEN      Procedure:    Simple hysterectomy      Additional Procedures:    Bilateral salpingo-oophorectomy    Specimen Integrity:    Intact hysterectomy specimen    Lymph Node Sampling:    Performed      Lymph Node Sampling:    Pelvic lymph nodes      Lymph  Node Sampling:    Para-aortic lymph nodes  TUMOR    Primary Tumor Site:    Anterior endometrium    Primary Tumor Site:    Posterior endometrium    Primary Tumor Site:    Lower uterine segment    Primary Tumor Site:    Other (specify): Cervical stroma    Histologic Type:    Serous carcinoma    Histologic Grade:    G3: Poorly differentiated    Tumor Size:    Greatest dimension (cm): 1.5 cm      Additional Dimension (cm):    1.3 cm      Additional Dimension (cm):  1.1 cm      Myometrial Invasion:    Present        Depth of Myometrial Invasion:    Specify depth of invasion (mm): 12 mm        Myometrial Thickness:    Specify (mm): 14 mm      Tumor Involvement of Cervix:    Invasion of cervical stromal connective tissue      Extent of Involvement of Other Organs:    Right ovary        Involvement Status of Right Ovary:    Not involved      Extent of Involvement of Other Organs:    Left ovary        Involvement Status of Left Ovary:    Not involved      Extent of Involvement of Other Organs:    Right fallopian tube        Involvement Status of Right Fallopian Tube:    Not involved      Extent of Involvement of Other Organs:    Left fallopian tube        Involvement Status of Left Fallopian Tube:    Not involved      Peritoneal Ascitic Fluid:    Not performed / unknown      Lymph-Vascular Invasion:    Not identified  MARGINS    Margins:    Uninvolved by invasive carcinoma      Distance of Invasive Carcinoma from Closest Margin (mm):    Specify (mm): 10 mm      Specify Margin:    Specify Margin: Cervix  LYMPH NODES    Regional Lymph Nodes:    Pelvic      Number of Pelvic Lymph Nodes Examined:    Specify number: 4    Regional Lymph Nodes:    Para-aortic      Number of Para-aortic Lymph Nodes Examined:    Specify number: 6    Lymph Node Involvement:    Pelvic      Number of Pelvic Lymph Nodes Involved:    Specify number: 0    Lymph Node Involvement:    Para-aortic      Number of  Para-aortic Lymph Nodes Involved:    Specify number: 0  STAGE (pTNM)    Primary Tumor (pT):    pT2: Tumor invades stromal connective tissue of the cervix, but does not extend beyond uterus      Modifier:    (sn)      Category (pN):    pN0: No regional lymph node metastasis  FIGO STAGE    FIGO Stage:    II: Tumor invades cervical stroma, but does not extend beyond the uterus  ADDITIONAL FINDINGS    Additional Pathologic Findings:    None identified   Comment Immunohistochemical stains are performed on block F5 with the following results: P53: strong nuclear staining P16: strongly and diffusely positive ER: negative PR: negative  The morphology and immunoprofile are consistent with a serous carcinoma. The tumor is predominantly glandular and invades deeply by small, individual glands.         RADIOGRAPHY: EXAM: CT CHEST, ABDOMEN, AND PELVIS WITH CONTRAST 10-28-15  COMPARISON: None.  FINDINGS: CT CHEST FINDINGS  Mediastinum/Lymph Nodes: Mediastinal lymph nodes are not enlarged by CT size criteria. No hilar or axillary adenopathy. Atherosclerotic calcification of the arterial vasculature. Heart size normal. No pericardial effusion.  Lungs/Pleura: Lungs are clear. No pleural fluid. Airway is unremarkable.  Musculoskeletal: No worrisome  lytic or sclerotic lesions. Degenerative changes are seen in the spine.  CT ABDOMEN PELVIS FINDINGS  Hepatobiliary: Liver and gallbladder are unremarkable. No biliary ductal dilatation.  Pancreas: Negative.  Spleen: Negative.  Adrenals/Urinary Tract: Adrenal glands and kidneys are unremarkable. Ureters are decompressed. Bladder is unremarkable.  Stomach/Bowel: Tiny hiatal hernia. Stomach is decompressed. Small bowel, appendix and colon are unremarkable.  Vascular/Lymphatic: Atherosclerotic calcification of the arterial vasculature. Infrarenal aorta measures up to 2.8 cm. No pathologically enlarged lymph  nodes.  Reproductive: Uterus and ovaries are visualized.  Other: No free fluid. Mesenteries and peritoneum are unremarkable.  Musculoskeletal: No worrisome lytic or sclerotic lesions.  IMPRESSION: 1. No evidence of metastatic disease. 2. Ectatic abdominal aorta at risk for aneurysm development. Recommend followup by ultrasound in 5 years. This recommendation follows ACR consensus guidelines: White Paper of the ACR Incidental Findings Committee II on Vascular Findings. J Am Coll Radiol 2013; 10:789-794.    DISCUSSION: All of history above reviewed with patient and family, including circumstances surrounding diagnosis, surgery and pathology findings, and rationale for adjuvant chemotherapy and radiation therapy. We have discussed mechanism of action of the chemotherapy, possible side effects including hair loss, cytopenias, allergic reactions, taxol aches, taxol peripheral neuropathy, constipation. We have discussed chemotherapy schedule using every 3 week regimen, premedication steroids, antiemetics, peripheral administration vs PAC (will begin with peripheral as veins appear adequate). She understands that she will see provider and have blood work followed between treatments.  We have discussed background for sentinel lymph node evaluation in melanoma and breast cancer. We have discussed fact that adjuvant treatment is done in attempt to eradicate any residual microscopic disease, but that fortunately we do not have measurable disease to follow as she receives this therapy. She would like to begin as soon as possible, understands this depends on preauthorization and space in infusion area.   All questions answered. Patient has given verbal consent for chemotherapy.    IMPRESSION / PLAN:  1.IIIC1 serous high grade endometrial carcinoma with cervical stromal involvement and isolated tumor cells identified in bilateral sentinel pelvic nodes: recovering well from robotic hysterectomy BSO  pelvic and periaortic node evaluations at [facility] 10-29-15. Plan 3 cycles of every 3 week carboplatin taxol beginning next week, then pelvic radiation, then 3 additional cycles of chemo. I will see her back ~1 week and ~2 weeks after first chemo, with labs.   2.long and ongoing tobacco abuse: we have discussed and I have encouraged cessation. Will continue to address 3.mild leukocytosis and macrocytosis without anemia: mildly elevated WBC likely post op with no symptoms of infection, macrocytosis slightly increased from a year ago, B12 and folate normal 09-2014, denies ETOH. Follow  4. Overdue mammogram, last at [facility] 5. Four benign colon polyps at colonoscopy by Dr Henrene Pastor 06-2008: repeat per GI 6.osteopenia by bone density scan 09-2015 7.elevated lipids followed by PCP 8.hx BTL 9.radiology evidence of atherosclerosis thoracic and abdominal, and ectatic abdominal aorta. Recommendation to follow up with [place] in 5 years, cc this information to PCP   Patient and accompanying individuals have had questions answered to their satisfaction and are in agreement with plan above. They can contact this office for questions or concerns at any time prior to next scheduled visit. Chemo orders and granix orders entered, managed care notified for preauth,  infusion scheduler notified.  Time spent 60 min, including >50% discussion and coordination of care. Route to PCP, cc Drs Denman George, Sondra Come, [place]   Gordy Levan, MD 11/13/2015 10:07 AM

## 2015-11-13 ENCOUNTER — Encounter: Payer: Self-pay | Admitting: Oncology

## 2015-11-13 ENCOUNTER — Telehealth: Payer: Self-pay | Admitting: Oncology

## 2015-11-13 DIAGNOSIS — I77811 Abdominal aortic ectasia: Secondary | ICD-10-CM | POA: Insufficient documentation

## 2015-11-13 DIAGNOSIS — M8580 Other specified disorders of bone density and structure, unspecified site: Secondary | ICD-10-CM | POA: Insufficient documentation

## 2015-11-13 DIAGNOSIS — Z86010 Personal history of colonic polyps: Secondary | ICD-10-CM | POA: Insufficient documentation

## 2015-11-13 DIAGNOSIS — Z720 Tobacco use: Secondary | ICD-10-CM | POA: Insufficient documentation

## 2015-11-13 DIAGNOSIS — I714 Abdominal aortic aneurysm, without rupture, unspecified: Secondary | ICD-10-CM | POA: Insufficient documentation

## 2015-11-13 DIAGNOSIS — D7589 Other specified diseases of blood and blood-forming organs: Secondary | ICD-10-CM | POA: Insufficient documentation

## 2015-11-13 NOTE — Telephone Encounter (Signed)
lvm to inform patient of 4/21 appt date/times and 4/27 appt date/times per LL 4/13 pof

## 2015-11-18 ENCOUNTER — Telehealth: Payer: Self-pay

## 2015-11-18 NOTE — Telephone Encounter (Signed)
Reviewed times to take Decadron on 11-19-15 at 9 pm and at  11-20-15 at 0300 with food. Reviewed post treatment antiemetic regime as noted below by Dr. Marko Plume. Reviewed treatment intervals as 3 weeks not 2 weeks as Pt thought from appointment schedule on MY Chart. All other questions about treatment sequencing and side effects  Such as Taxol aches answered to patient's satisfaction.

## 2015-11-18 NOTE — Telephone Encounter (Signed)
-----   Message from Gordy Levan, MD sent at 11/12/2015  5:36 PM EDT ----- Scripts to pharmacy please, generics fine:  Ondansetron 8 mg  One every 8 hrs prn nausea Will not make drowsy #30 1 RF  Lorazepam  0.5 mg   One SL or po every 6 hrs prn nausea. Will make drowsy.  #20   Decadron 4 mg   Five tablets with food 12 hrs and 6 hrs before taxol #10   RN please speak with her by phone before first chemo to review nausea meds and times for decadron. Please remind her to take ativan night of chemo whether or not nauseated and zofran AM after chemo whether or not nauseated.  HER CELL IS NOT WORKING, so use home phone [phone] or husband's cell [phone]. Please be sure those #s listed in EMR also     thanks

## 2015-11-19 ENCOUNTER — Other Ambulatory Visit: Payer: Self-pay

## 2015-11-19 DIAGNOSIS — C541 Malignant neoplasm of endometrium: Secondary | ICD-10-CM

## 2015-11-20 ENCOUNTER — Encounter: Payer: Self-pay | Admitting: Oncology

## 2015-11-20 ENCOUNTER — Other Ambulatory Visit (HOSPITAL_BASED_OUTPATIENT_CLINIC_OR_DEPARTMENT_OTHER): Payer: BLUE CROSS/BLUE SHIELD

## 2015-11-20 ENCOUNTER — Ambulatory Visit (HOSPITAL_BASED_OUTPATIENT_CLINIC_OR_DEPARTMENT_OTHER): Payer: BLUE CROSS/BLUE SHIELD

## 2015-11-20 VITALS — BP 131/92 | HR 80 | Temp 98.4°F | Resp 18

## 2015-11-20 DIAGNOSIS — C541 Malignant neoplasm of endometrium: Secondary | ICD-10-CM

## 2015-11-20 DIAGNOSIS — Z5111 Encounter for antineoplastic chemotherapy: Secondary | ICD-10-CM | POA: Diagnosis not present

## 2015-11-20 LAB — COMPREHENSIVE METABOLIC PANEL
ALT: 11 U/L (ref 0–55)
AST: 13 U/L (ref 5–34)
Albumin: 3.7 g/dL (ref 3.5–5.0)
Alkaline Phosphatase: 99 U/L (ref 40–150)
Anion Gap: 13 mEq/L — ABNORMAL HIGH (ref 3–11)
BUN: 16.9 mg/dL (ref 7.0–26.0)
CALCIUM: 10 mg/dL (ref 8.4–10.4)
CHLORIDE: 106 meq/L (ref 98–109)
CO2: 17 mEq/L — ABNORMAL LOW (ref 22–29)
CREATININE: 1 mg/dL (ref 0.6–1.1)
EGFR: 62 mL/min/{1.73_m2} — ABNORMAL LOW (ref >90)
GLUCOSE: 237 mg/dL — AB (ref 70–140)
POTASSIUM: 4.1 meq/L (ref 3.5–5.1)
SODIUM: 137 meq/L (ref 136–145)
Total Bilirubin: <0.3 mg/dL (ref 0.20–1.20)
Total Protein: 7.4 g/dL (ref 6.4–8.3)

## 2015-11-20 LAB — CBC WITH DIFFERENTIAL/PLATELET
BASO%: 0.4 % (ref 0.0–2.0)
Basophils Absolute: 0 10*3/uL (ref 0.0–0.1)
EOS%: 0.1 % (ref 0.0–7.0)
Eosinophils Absolute: 0 10*3/uL (ref 0.0–0.5)
HCT: 41.3 % (ref 34.8–46.6)
HEMOGLOBIN: 14 g/dL (ref 11.6–15.9)
LYMPH#: 0.8 10*3/uL — AB (ref 0.9–3.3)
LYMPH%: 8.5 % — ABNORMAL LOW (ref 14.0–49.7)
MCH: 34.5 pg — AB (ref 25.1–34.0)
MCHC: 33.9 g/dL (ref 31.5–36.0)
MCV: 102 fL — ABNORMAL HIGH (ref 79.5–101.0)
MONO#: 0.1 10*3/uL (ref 0.1–0.9)
MONO%: 0.6 % (ref 0.0–14.0)
NEUT#: 8.2 10*3/uL — ABNORMAL HIGH (ref 1.5–6.5)
NEUT%: 90.4 % — AB (ref 38.4–76.8)
Platelets: 318 10*3/uL (ref 145–400)
RBC: 4.05 10*6/uL (ref 3.70–5.45)
RDW: 13.2 % (ref 11.2–14.5)
WBC: 9.1 10*3/uL (ref 3.9–10.3)

## 2015-11-20 MED ORDER — FOSAPREPITANT DIMEGLUMINE INJECTION 150 MG
Freq: Once | INTRAVENOUS | Status: AC
  Administered 2015-11-20: 10:00:00 via INTRAVENOUS
  Filled 2015-11-20: qty 5

## 2015-11-20 MED ORDER — ONDANSETRON HCL 40 MG/20ML IJ SOLN
Freq: Once | INTRAMUSCULAR | Status: AC
  Administered 2015-11-20: 10:00:00 via INTRAVENOUS
  Filled 2015-11-20: qty 4

## 2015-11-20 MED ORDER — FAMOTIDINE IN NACL 20-0.9 MG/50ML-% IV SOLN
INTRAVENOUS | Status: AC
  Filled 2015-11-20: qty 50

## 2015-11-20 MED ORDER — FAMOTIDINE IN NACL 20-0.9 MG/50ML-% IV SOLN
20.0000 mg | Freq: Once | INTRAVENOUS | Status: AC
  Administered 2015-11-20: 20 mg via INTRAVENOUS

## 2015-11-20 MED ORDER — SODIUM CHLORIDE 0.9 % IV SOLN
Freq: Once | INTRAVENOUS | Status: AC
  Administered 2015-11-20: 09:00:00 via INTRAVENOUS

## 2015-11-20 MED ORDER — DIPHENHYDRAMINE HCL 50 MG/ML IJ SOLN
INTRAMUSCULAR | Status: AC
  Filled 2015-11-20: qty 1

## 2015-11-20 MED ORDER — SODIUM CHLORIDE 0.9 % IV SOLN
531.6000 mg | Freq: Once | INTRAVENOUS | Status: AC
  Administered 2015-11-20: 530 mg via INTRAVENOUS
  Filled 2015-11-20: qty 53

## 2015-11-20 MED ORDER — DIPHENHYDRAMINE HCL 50 MG/ML IJ SOLN
50.0000 mg | Freq: Once | INTRAMUSCULAR | Status: AC
  Administered 2015-11-20: 50 mg via INTRAVENOUS

## 2015-11-20 MED ORDER — SODIUM CHLORIDE 0.9 % IV SOLN
175.0000 mg/m2 | Freq: Once | INTRAVENOUS | Status: AC
  Administered 2015-11-20: 294 mg via INTRAVENOUS
  Filled 2015-11-20: qty 49

## 2015-11-20 NOTE — Progress Notes (Signed)
Introduced myself as her FA.  Informed pt unfortunately there aren't any foundations offering copay assistance for her Dx at this time.  Pt stated she did some research as well & couldn't find any assistance either.  She stated her out of pocket is pretty low.  I stated, "that being the case she may not need assistance" but I gave her my card & assured her if funding becomes available I would reach out to them in her behalf.

## 2015-11-20 NOTE — Patient Instructions (Signed)
[facility] Discharge Instructions for Patients Receiving Chemotherapy  Today you received the following chemotherapy agents Taxol/Carboplatin To help prevent nausea and vomiting after your treatment, we encourage you to take your nausea medication as prescribed.   If you develop nausea and vomiting that is not controlled by your nausea medication, call the clinic.   BELOW ARE SYMPTOMS THAT SHOULD BE REPORTED IMMEDIATELY:  *FEVER GREATER THAN 100.5 F  *CHILLS WITH OR WITHOUT FEVER  NAUSEA AND VOMITING THAT IS NOT CONTROLLED WITH YOUR NAUSEA MEDICATION  *UNUSUAL SHORTNESS OF BREATH  *UNUSUAL BRUISING OR BLEEDING  TENDERNESS IN MOUTH AND THROAT WITH OR WITHOUT PRESENCE OF ULCERS  *URINARY PROBLEMS  *BOWEL PROBLEMS  UNUSUAL RASH Items with * indicate a potential emergency and should be followed up as soon as possible.  Feel free to call the clinic you have any questions or concerns. The clinic phone number is [phone].  Please show the CHEMO ALERT CARD at check-in to the Emergency Department and triage nurse.   

## 2015-11-23 ENCOUNTER — Telehealth: Payer: Self-pay

## 2015-11-23 NOTE — Telephone Encounter (Signed)
-----   Message from Renford Dills, RN sent at 11/20/2015  9:19 AM EDT ----- Regarding: chemo f/u [place] 1st Taxol/Carbo

## 2015-11-23 NOTE — Telephone Encounter (Signed)
Called Jacqueline Hammond for chemotherapy F/U.  Patient is doing well.  Denies n/v.  New side effects on Sunday, face and chest flushing, gas and bloating, extreme fatigue, vaginal burning, all of which have gone away.  Bladder functioning well.  Eating and drinking well and I instructed to drink 64 oz minimum daily or at least the day before, of and after treatment.  Denies questions at this time and encouraged to call if needed.  Reviewed how to call after hours in the case of an emergency.

## 2015-11-23 NOTE — Telephone Encounter (Signed)
Chemo f/u call already done

## 2015-11-24 ENCOUNTER — Other Ambulatory Visit: Payer: Self-pay | Admitting: Oncology

## 2015-11-24 DIAGNOSIS — C541 Malignant neoplasm of endometrium: Secondary | ICD-10-CM

## 2015-11-26 ENCOUNTER — Ambulatory Visit (HOSPITAL_BASED_OUTPATIENT_CLINIC_OR_DEPARTMENT_OTHER): Payer: BLUE CROSS/BLUE SHIELD | Admitting: Oncology

## 2015-11-26 ENCOUNTER — Telehealth: Payer: Self-pay | Admitting: Oncology

## 2015-11-26 ENCOUNTER — Encounter: Payer: Self-pay | Admitting: Oncology

## 2015-11-26 ENCOUNTER — Other Ambulatory Visit (HOSPITAL_BASED_OUTPATIENT_CLINIC_OR_DEPARTMENT_OTHER): Payer: BLUE CROSS/BLUE SHIELD

## 2015-11-26 VITALS — BP 137/92 | HR 106 | Temp 98.0°F | Resp 18 | Wt 135.4 lb

## 2015-11-26 DIAGNOSIS — C541 Malignant neoplasm of endometrium: Secondary | ICD-10-CM | POA: Diagnosis not present

## 2015-11-26 DIAGNOSIS — K5900 Constipation, unspecified: Secondary | ICD-10-CM

## 2015-11-26 DIAGNOSIS — K5909 Other constipation: Secondary | ICD-10-CM

## 2015-11-26 DIAGNOSIS — G4700 Insomnia, unspecified: Secondary | ICD-10-CM | POA: Diagnosis not present

## 2015-11-26 DIAGNOSIS — M8580 Other specified disorders of bone density and structure, unspecified site: Secondary | ICD-10-CM | POA: Diagnosis not present

## 2015-11-26 DIAGNOSIS — Z720 Tobacco use: Secondary | ICD-10-CM

## 2015-11-26 LAB — COMPREHENSIVE METABOLIC PANEL
ALBUMIN: 4.1 g/dL (ref 3.5–5.0)
ALK PHOS: 87 U/L (ref 40–150)
ALT: 37 U/L (ref 0–55)
AST: 24 U/L (ref 5–34)
Anion Gap: 8 mEq/L (ref 3–11)
BILIRUBIN TOTAL: 0.67 mg/dL (ref 0.20–1.20)
BUN: 14.6 mg/dL (ref 7.0–26.0)
CO2: 27 meq/L (ref 22–29)
CREATININE: 0.9 mg/dL (ref 0.6–1.1)
Calcium: 9.9 mg/dL (ref 8.4–10.4)
Chloride: 102 mEq/L (ref 98–109)
EGFR: 72 mL/min/{1.73_m2} — AB (ref >90)
GLUCOSE: 98 mg/dL (ref 70–140)
Potassium: 4 mEq/L (ref 3.5–5.1)
SODIUM: 136 meq/L (ref 136–145)
TOTAL PROTEIN: 7.4 g/dL (ref 6.4–8.3)

## 2015-11-26 LAB — CBC WITH DIFFERENTIAL/PLATELET
BASO%: 0.4 % (ref 0.0–2.0)
BASOS ABS: 0 10*3/uL (ref 0.0–0.1)
EOS ABS: 0.1 10*3/uL (ref 0.0–0.5)
EOS%: 1 % (ref 0.0–7.0)
HCT: 43.1 % (ref 34.8–46.6)
HEMOGLOBIN: 14.9 g/dL (ref 11.6–15.9)
LYMPH%: 38.5 % (ref 14.0–49.7)
MCH: 34.9 pg — ABNORMAL HIGH (ref 25.1–34.0)
MCHC: 34.6 g/dL (ref 31.5–36.0)
MCV: 100.9 fL (ref 79.5–101.0)
MONO#: 0.1 10*3/uL (ref 0.1–0.9)
MONO%: 1.6 % (ref 0.0–14.0)
NEUT#: 3 10*3/uL (ref 1.5–6.5)
NEUT%: 58.5 % (ref 38.4–76.8)
Platelets: 205 10*3/uL (ref 145–400)
RBC: 4.27 10*6/uL (ref 3.70–5.45)
RDW: 12.7 % (ref 11.2–14.5)
WBC: 5.1 10*3/uL (ref 3.9–10.3)
lymph#: 2 10*3/uL (ref 0.9–3.3)

## 2015-11-26 NOTE — Progress Notes (Signed)
OFFICE PROGRESS NOTE   November 26, 2015   Physicians: Terrence Dupont Rossi/ Cindie Laroche cc Yong Channel Brayton Mars, MD (PCP, Abbie Sons, Emilie Rutter. New patient consultation with Dr Gery Pray 12-16-15.   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 She had first adjuvant carboplatin taxol on 11-20-15; her insurance will not approve gCSF "until counts drop". Recommendation per [facility] gyn onc multidisciplinary conference  is chemotherapy and radiation in sandwich fashion; consultation with Dr Sondra Come will be 12-16-15.   Patient had no problems with full steroid premeds for first chemo, or that chemo administration, 2 attempts to start IV. Since chemo she had no bowel movement until very small amount on 11-25-15; she has used miralax once daily. She is uncomfortable from the constipation. She has had no nausea tho food odors initially were very bothersome; she has eaten pork chops, oatmeal, beef stew in last 2 days. She did not have severe taxol aches and has no neuropathy. She has slept poorly, at most 3 hrs last night. She is smoking only 1-2 cigarettes daily, again counseled to DC entirely. No SOB, bleeding, fever or symptoms of infection, LE swelling, bladder symptoms. She is fatigued, which she feels is from little sleep.  Remainder of 10 point Review of Systems negative / unchanged.    ONCOLOGIC HISTORY Patient presented to Dr Phineas Real as new patient 10-01-15 after noticing recent slight vaginal spotting. PAP had atypical glandular cells, then sonohysterogram and endometrial biopsy on 10-19-15. The uterus was 7.4 x 3.5 x 3.0 cm with endometrial stripe of 6.8 mm. Endometrial biopsy ([id number] from [facility]) showed endometrial carcinoma which appeared high grade with serous features. She was seen by Dr Josephina Shih on 10-23-15, exam not remarkable. She had CT CAP 10-28-15 with no evidence of metastatic disease, incidental  atherosclerosis and ectatic abdominal aorta. She had surgery at [facility] by Dr Cindie Laroche on 10-29-15, which was robotic hysterectomy BSO with pelvic and paraaortic sentinel node evaluation. Intraoperative findings were of small uterus with normal tubes and ovaries. Adhesions of bladder due to uterine fundus from prior surgery, bilateral pelvic sentinel pelvic mapping to left obturator space node and to right external iliac node, 3 right paraaortic nodes mapped as sentinel nodes, normal upper abdominal survey. Pathology [phone] had high grade serous adenocarcinoma of uterus involving cervical stroma and outer half of myometrium, isolated tumor cells in 2/2 right external iliac and 2/2 left obturator nodes, negative right and left periaortics, bilateral tubes and ovaries benign. Immunohistochemical stains confirmed serous carcinoma, ER and PR negative. [facility] labs 10-27-15 (preoperative) included CA 125 of 12.1, WBC 10.5, Hgb 14.5 and platelets 290k. [facility] gyn oncology multidisciplinary conference 11-04-15 recommended 6 cycles carboplatin taxol given in sandwich fashion with pelvic radiation and vaginal brachytherapy. First carboplatin taxol given 11-20-15.   Objective:  Vital signs in last 24 hours:  BP 137/92 mmHg  Pulse 106  Temp(Src) 98 F (36.7 C) (Oral)  Resp 18  Wt 135 lb 6.4 oz (61.417 kg)  SpO2 100% Weight down 3 lbs.  Alert, oriented and appropriate, very pleasant as always. Ambulatory without assistance. Looks mildly uncomfortable but not acutely so. Respirations not labored RA No alopecia  HEENT:PERRL, sclerae not icteric. Oral mucosa moist without lesions, posterior pharynx clear.  Neck supple. No JVD.  Lymphatics:no cervical,supraclavicular or inguinal adenopathy Resp: clear to auscultation bilaterally and normal percussion bilaterally Cardio: regular rate and rhythm. No gallop. GI: soft, nontender, full, no mass or  organomegaly. A few bowel sounds. Surgical incisions closed, not  remarkable. Musculoskeletal/ Extremities: without pitting edema, cords, tenderness Neuro: no peripheral neuropathy. Otherwise nonfocal. PSYCH appropriate mood and affect Skin without rash, ecchymosis, petechiae   Lab Results:  Results for orders placed or performed in visit on 11/26/15  CBC with Differential  Result Value Ref Range   WBC 5.1 3.9 - 10.3 10e3/uL   NEUT# 3.0 1.5 - 6.5 10e3/uL   HGB 14.9 11.6 - 15.9 g/dL   HCT 43.1 34.8 - 46.6 %   Platelets 205 145 - 400 10e3/uL   MCV 100.9 79.5 - 101.0 fL   MCH 34.9 (H) 25.1 - 34.0 pg   MCHC 34.6 31.5 - 36.0 g/dL   RBC 4.27 3.70 - 5.45 10e6/uL   RDW 12.7 11.2 - 14.5 %   lymph# 2.0 0.9 - 3.3 10e3/uL   MONO# 0.1 0.1 - 0.9 10e3/uL   Eosinophils Absolute 0.1 0.0 - 0.5 10e3/uL   Basophils Absolute 0.0 0.0 - 0.1 10e3/uL   NEUT% 58.5 38.4 - 76.8 %   LYMPH% 38.5 14.0 - 49.7 %   MONO% 1.6 0.0 - 14.0 %   EOS% 1.0 0.0 - 7.0 %   BASO% 0.4 0.0 - 2.0 %  Comprehensive metabolic panel  Result Value Ref Range   Sodium 136 136 - 145 mEq/L   Potassium 4.0 3.5 - 5.1 mEq/L   Chloride 102 98 - 109 mEq/L   CO2 27 22 - 29 mEq/L   Glucose 98 70 - 140 mg/dl   BUN 14.6 7.0 - 26.0 mg/dL   Creatinine 0.9 0.6 - 1.1 mg/dL   Total Bilirubin 0.67 0.20 - 1.20 mg/dL   Alkaline Phosphatase 87 40 - 150 U/L   AST 24 5 - 34 U/L   ALT 37 0 - 55 U/L   Total Protein 7.4 6.4 - 8.3 g/dL   Albumin 4.1 3.5 - 5.0 g/dL   Calcium 9.9 8.4 - 10.4 mg/dL   Anion Gap 8 3 - 11 mEq/L   EGFR 72 (L) >90 ml/min/1.73 m2     Studies/Results:  No results found.  Medications: I have reviewed the patient's current medications. See discussion below  DISCUSSION Constipation: Take miralax when you get home today If no bowel movement by early afternoon, take 1/2 bottle of mag citrate. Repeat other half bottle late afternoon if no results. Use Miralax twice daily  Call to speak with Dr Mariana Kaufman RN if no good bowel movement by 4-28.  Tonight try Tylenol PM ~ an hour before  you want to go to bed. If still not asleep an hour after you go to bed, take the lorazepam.  Counts decreasing but not at nadir. She should call if fever or symptoms of infection or significant bleeding prior to repeat counts next week.  Note will need insurance preauth for gCSF urgently when that is needed (message from E.Smith 11-16-15)  Discussed cold foods to avoid food odors just after chemo. Husband does most of cooking.  Assessment/Plan:  1.IIIC1 serous high grade endometrial carcinoma with cervical stromal involvement and isolated tumor cells identified in bilateral sentinel pelvic nodes:  robotic hysterectomy BSO pelvic and periaortic node evaluations at [facility] 10-29-15. Expect 3 cycles of every 3 week carboplatin taxol beginning next week, then pelvic radiation, then 3 additional cycles of chemo; consultation with Dr Sondra Come to be 12-16-15. I will see her back ~1 week with labs.  2.constipation: increase miralax and add mag citrate. RN to follow up tomorrow by  phone.  3.long and ongoing tobacco abuse: we have discussed and I have encouraged cessation. Will continue to address 4. Overdue mammogram, last at [facility] 5. Four benign colon polyps at colonoscopy by Dr Henrene Pastor 06-2008: repeat per GI 6.osteopenia by bone density scan 09-2015 7.elevated lipids followed by PCP 8.hx BTL 9.radiology evidence of atherosclerosis thoracic and abdominal, and ectatic abdominal aorta. Recommendation to follow up with [place] in 5 years, cc this information to PCP 10. mild leukocytosis and macrocytosis without anemia at consultation visit 11-13-15:  mildly elevated WBC likely post op with no symptoms of infection, macrocytosis slightly increased from a year ago, B12 and folate normal 09-2014, denies ETOH. Follow 11.difficulty sleeping : plan as above  All questions answered, instructions given orally and written and they know to call if concerns prior to next scheduled visit. Time spent 25 min including >50%  counseling and coordination of care. Route Dr Forde Dandy, cc Dr Christain Sacramento, MD   11/26/2015, 11:36 AM

## 2015-11-26 NOTE — Telephone Encounter (Signed)
appt made and avs printed °

## 2015-11-26 NOTE — Patient Instructions (Signed)
Take miralax when you get home today If no bowel movement by early afternoon, take 1/2 bottle of mag citrate. Repeat other half bottle late afternoon if no results.  Use Miralax twice daily   Call to speak with Dr Mariana Kaufman RN if no good bowel movement by 4-28.  Tonight try Tylenol PM ~ an hour before you want to go to bed. If still not asleep an hour after you go to bed, take the lorazepam.

## 2015-11-27 ENCOUNTER — Telehealth: Payer: Self-pay

## 2015-11-27 ENCOUNTER — Ambulatory Visit: Payer: BLUE CROSS/BLUE SHIELD | Admitting: Gynecology

## 2015-11-27 NOTE — Telephone Encounter (Addendum)
Told Ms. Diveley that Dr. Marko Plume said to use a Dulcolax suppository.  If no BM then take in another 1/2 of a bolltle of Mag-Citrate and then repeat in ~ if no BM. Ms. Mando verblaized understanding and wrote instructions sown.  She is not in any pain.  She is passing gas.

## 2015-11-27 NOTE — Telephone Encounter (Signed)
Patient Instructions   MAEDELL BLAUSER (MR# [id number])      Patient Instructions Info    Author Note Status Last Update User Last Update Date/Time   Gordy Levan, MD Signed Gordy Levan, MD 11/26/2015 11:22 AM    Patient Instructions    Expand All Collapse All   Take miralax when you get home today If no bowel movement by early afternoon, take 1/2 bottle of mag citrate. Repeat other half bottle late afternoon if no results.  Use Miralax twice daily   Call to speak with Dr Mariana Kaufman RN if no good bowel movement by 4-28.  Tonight try Tylenol PM ~ an hour before you want to go to bed. If still not asleep an hour after you go to bed, take the lorazepam.

## 2015-11-27 NOTE — Telephone Encounter (Signed)
Jacqueline Hammond called stating that she has followed the instructions from 11-26-15 visit with Dr.Livesay as noted below. No BM.  Suggested she take a cpful of Miralax in her hot tea. Will review with  Dr. Marko Plume.

## 2015-11-28 DIAGNOSIS — K5909 Other constipation: Secondary | ICD-10-CM | POA: Insufficient documentation

## 2015-11-28 DIAGNOSIS — K5900 Constipation, unspecified: Secondary | ICD-10-CM | POA: Insufficient documentation

## 2015-11-29 ENCOUNTER — Other Ambulatory Visit: Payer: Self-pay | Admitting: Oncology

## 2015-11-30 ENCOUNTER — Telehealth: Payer: Self-pay | Admitting: Oncology

## 2015-11-30 ENCOUNTER — Encounter: Payer: Self-pay | Admitting: Oncology

## 2015-11-30 ENCOUNTER — Other Ambulatory Visit (HOSPITAL_BASED_OUTPATIENT_CLINIC_OR_DEPARTMENT_OTHER): Payer: BLUE CROSS/BLUE SHIELD

## 2015-11-30 ENCOUNTER — Ambulatory Visit (HOSPITAL_BASED_OUTPATIENT_CLINIC_OR_DEPARTMENT_OTHER): Payer: BLUE CROSS/BLUE SHIELD | Admitting: Oncology

## 2015-11-30 VITALS — BP 139/83 | HR 87 | Temp 98.1°F | Resp 18 | Ht 63.0 in | Wt 138.5 lb

## 2015-11-30 DIAGNOSIS — M8580 Other specified disorders of bone density and structure, unspecified site: Secondary | ICD-10-CM

## 2015-11-30 DIAGNOSIS — C541 Malignant neoplasm of endometrium: Secondary | ICD-10-CM

## 2015-11-30 DIAGNOSIS — Z720 Tobacco use: Secondary | ICD-10-CM

## 2015-11-30 DIAGNOSIS — D701 Agranulocytosis secondary to cancer chemotherapy: Secondary | ICD-10-CM

## 2015-11-30 DIAGNOSIS — K5909 Other constipation: Secondary | ICD-10-CM

## 2015-11-30 DIAGNOSIS — T451X5A Adverse effect of antineoplastic and immunosuppressive drugs, initial encounter: Secondary | ICD-10-CM

## 2015-11-30 LAB — COMPREHENSIVE METABOLIC PANEL
ALBUMIN: 3.7 g/dL (ref 3.5–5.0)
ALK PHOS: 91 U/L (ref 40–150)
ALT: 26 U/L (ref 0–55)
AST: 19 U/L (ref 5–34)
Anion Gap: 6 mEq/L (ref 3–11)
BUN: 11.3 mg/dL (ref 7.0–26.0)
CALCIUM: 9.3 mg/dL (ref 8.4–10.4)
CO2: 27 mEq/L (ref 22–29)
CREATININE: 0.8 mg/dL (ref 0.6–1.1)
Chloride: 106 mEq/L (ref 98–109)
EGFR: 78 mL/min/{1.73_m2} — AB (ref >90)
Glucose: 79 mg/dl (ref 70–140)
POTASSIUM: 3.6 meq/L (ref 3.5–5.1)
Sodium: 140 mEq/L (ref 136–145)
TOTAL PROTEIN: 6.5 g/dL (ref 6.4–8.3)

## 2015-11-30 LAB — CBC WITH DIFFERENTIAL/PLATELET
BASO%: 0.5 % (ref 0.0–2.0)
BASOS ABS: 0 10*3/uL (ref 0.0–0.1)
EOS ABS: 0 10*3/uL (ref 0.0–0.5)
EOS%: 0.7 % (ref 0.0–7.0)
HEMATOCRIT: 38.9 % (ref 34.8–46.6)
HEMOGLOBIN: 12.7 g/dL (ref 11.6–15.9)
LYMPH#: 1.8 10*3/uL (ref 0.9–3.3)
LYMPH%: 50.9 % — ABNORMAL HIGH (ref 14.0–49.7)
MCH: 33.6 pg (ref 25.1–34.0)
MCHC: 32.6 g/dL (ref 31.5–36.0)
MCV: 103 fL — AB (ref 79.5–101.0)
MONO#: 0.6 10*3/uL (ref 0.1–0.9)
MONO%: 15.5 % — AB (ref 0.0–14.0)
NEUT%: 32.4 % — ABNORMAL LOW (ref 38.4–76.8)
NEUTROS ABS: 1.2 10*3/uL — AB (ref 1.5–6.5)
PLATELETS: 148 10*3/uL (ref 145–400)
RBC: 3.78 10*6/uL (ref 3.70–5.45)
RDW: 13.1 % (ref 11.2–14.5)
WBC: 3.6 10*3/uL — ABNORMAL LOW (ref 3.9–10.3)

## 2015-11-30 MED ORDER — DEXAMETHASONE 4 MG PO TABS: ORAL_TABLET | ORAL | Status: DC

## 2015-11-30 MED ORDER — TBO-FILGRASTIM 300 MCG/0.5ML ~~LOC~~ SOSY
300.0000 ug | PREFILLED_SYRINGE | Freq: Once | SUBCUTANEOUS | Status: AC
  Administered 2015-11-30: 300 ug via SUBCUTANEOUS
  Filled 2015-11-30: qty 0.5

## 2015-11-30 NOTE — Telephone Encounter (Signed)
Ms. Kalu had good results for stool evacuation Friday /Saturday after using suppository and another bottle of Mag-Citrate as noted below.

## 2015-11-30 NOTE — Telephone Encounter (Signed)
Gave and printed appt sched and avs fo rpt for May °

## 2015-11-30 NOTE — Progress Notes (Signed)
OFFICE PROGRESS NOTE   Nov 30, 2015   Physicians:  Terrence Dupont Rossi/ Cindie Laroche, Marin Olp, MD (PCP, Abbie Sons, Scarlette Shorts. New patient consultation with Dr Gery Pray 12-16-15.   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 Plan is chemotherapy x6 cycles in sandwich fashion with radiation (radiation oncology consultation upcoming). She has not had gCSF, however has Morton down to 1.2 today with counts not past nadir; her insurance would not authorize gCSF with initial treatment plan.   Patient had severe constipation following first chemo, bowels finally moving 11-27-15 after miralax, mag citrate and dulcolax suppository. She has felt much better since constipation resolved, with improved appetite. She was more fatigued over past few days, tho hard to differentiate this from the constipation issues. She is napping during the day as she is still not sleeping well at night. She denies fever, symptoms of infection, abdominal or pelvic pain, LE swelling, bleeding, increased SOB. Peripheral IV access was not difficult for chemo, understands that good hydration makes access easier.  Remainder of 10 point Review of Systems negative/ unchanged.   ONCOLOGIC HISTORY Patient presented to Dr Phineas Real as new patient 10-01-15 after noticing recent slight vaginal spotting. PAP had atypical glandular cells, then sonohysterogram and endometrial biopsy on 10-19-15. The uterus was 7.4 x 3.5 x 3.0 cm with endometrial stripe of 6.8 mm. Endometrial biopsy ([id number] from [facility]) showed endometrial carcinoma which appeared high grade with serous features. She was seen by Dr Josephina Shih on 10-23-15, exam not remarkable. She had CT CAP 10-28-15 with no evidence of metastatic disease, incidental atherosclerosis and ectatic abdominal aorta. She had  surgery at [facility] by Dr Cindie Laroche on 10-29-15, which was robotic hysterectomy BSO with pelvic and paraaortic sentinel node evaluation. Intraoperative findings were of small uterus with normal tubes and ovaries. Adhesions of bladder due to uterine fundus from prior surgery, bilateral pelvic sentinel pelvic mapping to left obturator space node and to right external iliac node, 3 right paraaortic nodes mapped as sentinel nodes, normal upper abdominal survey. Pathology [phone] had high grade serous adenocarcinoma of uterus involving cervical stroma and outer half of myometrium, isolated tumor cells in 2/2 right external iliac and 2/2 left obturator nodes, negative right and left periaortics, bilateral tubes and ovaries benign. Immunohistochemical stains confirmed serous carcinoma, ER and PR negative. [facility] labs 10-27-15 (preoperative) included CA 125 of 12.1, WBC 10.5, Hgb 14.5 and platelets 290k. [facility] gyn oncology multidisciplinary conference 11-04-15 recommended 6 cycles carboplatin taxol given in sandwich fashion with pelvic radiation and vaginal brachytherapy. First carboplatin taxol given 11-20-15.   Objective:  Vital signs in last 24 hours:  BP 139/83 mmHg  Pulse 87  Temp(Src) 98.1 F (36.7 C) (Oral)  Resp 18  Ht 5' 3"  (1.6 m)  Wt 138 lb 8 oz (62.823 kg)  BMI 24.54 kg/m2  SpO2 100% Weight up 3 lbs. Looks comfortable and in good spirits.  Alert, oriented and appropriate. Ambulatory without difficulty.  No alopecia  HEENT:PERRL, sclerae not icteric. Oral mucosa moist without lesions, posterior pharynx clear.  Neck supple. No JVD.  Lymphatics:no supraclavicular adenopathy Resp: a few minimal crackles left base posteriorly and somewhat diminished BS thruout, otherwise clear to auscultation bilaterally and no dullness to percussion bilaterally Cardio: regular rate and rhythm. No gallop. GI: soft, nontender, not distended, no mass or organomegaly. Normally active bowel sounds. Surgical  incisions not  remarkable. Musculoskeletal/ Extremities: without pitting edema, cords, tenderness Neuro: no peripheral neuropathy. Otherwise nonfocal. PSYCH appropriate mood and affect Skin without rash, ecchymosis, petechiae. Site of IV access from first chemo not remarkable   Lab Results:  Results for orders placed or performed in visit on 11/30/15  CBC with Differential  Result Value Ref Range   WBC 3.6 (L) 3.9 - 10.3 10e3/uL   NEUT# 1.2 (L) 1.5 - 6.5 10e3/uL   HGB 12.7 11.6 - 15.9 g/dL   HCT 38.9 34.8 - 46.6 %   Platelets 148 145 - 400 10e3/uL   MCV 103.0 (H) 79.5 - 101.0 fL   MCH 33.6 25.1 - 34.0 pg   MCHC 32.6 31.5 - 36.0 g/dL   RBC 3.78 3.70 - 5.45 10e6/uL   RDW 13.1 11.2 - 14.5 %   lymph# 1.8 0.9 - 3.3 10e3/uL   MONO# 0.6 0.1 - 0.9 10e3/uL   Eosinophils Absolute 0.0 0.0 - 0.5 10e3/uL   Basophils Absolute 0.0 0.0 - 0.1 10e3/uL   NEUT% 32.4 (L) 38.4 - 76.8 %   LYMPH% 50.9 (H) 14.0 - 49.7 %   MONO% 15.5 (H) 0.0 - 14.0 %   EOS% 0.7 0.0 - 7.0 %   BASO% 0.5 0.0 - 2.0 %  Comprehensive metabolic panel  Result Value Ref Range   Sodium 140 136 - 145 mEq/L   Potassium 3.6 3.5 - 5.1 mEq/L   Chloride 106 98 - 109 mEq/L   CO2 27 22 - 29 mEq/L   Glucose 79 70 - 140 mg/dl   BUN 11.3 7.0 - 26.0 mg/dL   Creatinine 0.8 0.6 - 1.1 mg/dL   Total Bilirubin <0.30 0.20 - 1.20 mg/dL   Alkaline Phosphatase 91 40 - 150 U/L   AST 19 5 - 34 U/L   ALT 26 0 - 55 U/L   Total Protein 6.5 6.4 - 8.3 g/dL   Albumin 3.7 3.5 - 5.0 g/dL   Calcium 9.3 8.4 - 10.4 mg/dL   Anion Gap 6 3 - 11 mEq/L   EGFR 78 (L) >90 ml/min/1.73 m2     Studies/Results:  No results found.  Medications: I have reviewed the patient's current medications. Granix 300 given today for chemo leukopenia, counts likely not at nadir based on platelets.  ADDENDUM: preauth not given to [facility] managed care at time of visit, and MD unable to do peer to peer due to that service not available at time of patient's visit. Message left on peer to  peer line that I had attempted to call; MD to follow up on 12-01-15.    DISCUSSION Looks much better overall today and aware that she need to be proactive in keeping bowels moving as she continues chemo. Encouraged good po intake now. Unless problems she does not need to see me on 12-07-15.  Discussed dropping blood counts related to first chemotherapy. Explained rationale and mechanism of action of gCSF. Will give granix today if possible per her insurance, then recheck counts on 12-01-15. Note she hopes to go to beach but will delay trip depending on counts. Reviewed contacting this office if infection concerns. Discussed possible granix aches.  Will also recheck CBC and draw CMET on 12-09-15 prior to cycle 2 on 12-10-15. RN notified as we will need to let patient know if counts ok for chemo as scheduled (ok to treat if ANC >=1.5 and plt >=100lk).    Assessment/Plan:  1.IIIC1 serous high grade endometrial carcinoma with cervical stromal involvement  and isolated tumor cells identified in bilateral sentinel pelvic nodes: robotic hysterectomy BSO pelvic and periaortic node evaluation at [facility] 10-29-15. Expect 3 cycles of every 3 week carboplatin taxol beginning next week, then pelvic radiation, then 3 additional cycles of chemo; consultation with Dr Sondra Come to be 12-16-15.  Cycle 2 chemo 12-10-15 if counts and chemistries adequate on 12-09-15, full premed decadron. I will see her with lab 12-17-15 2.Chemo leukopenia, counts not at nadir. Have given granix 300 x 1 now and will recheck counts on 12-01-15.  3.long and ongoing tobacco abuse: we have discussed and I have encouraged cessation. Will continue to address 4. Overdue mammogram, last at [facility] 5. Four benign colon polyps at colonoscopy by Dr Henrene Pastor 06-2008: repeat per GI 6.osteopenia by bone density scan 09-2015 7.elevated lipids followed by PCP 8.hx BTL 9.radiology evidence of atherosclerosis thoracic and abdominal, and ectatic abdominal aorta.  Recommendation to follow up with [place] in 5 years, cc this information to PCP 10. mild leukocytosis and macrocytosis without anemia at consultation visit 11-13-15: mildly elevated WBC thought post op with no symptoms of infection, macrocytosis slightly increased from a year ago, B12 and folate normal 09-2014, denies ETOH. Follow 11.Constipation after surgery and after cycle 1 chemo: resolved after several days of oral laxatives etc. Plan as above.   All questions answered and she knows to call if concerns prior to next scheduled visits. Messages to RNs re upcoming labs. Chemo orders confirmed. Time spent >30 min including with insurance.  Route PCP  Gordy Levan, MD   11/30/2015, 8:17 PM

## 2015-12-01 ENCOUNTER — Encounter: Payer: Self-pay | Admitting: Oncology

## 2015-12-01 ENCOUNTER — Other Ambulatory Visit (HOSPITAL_BASED_OUTPATIENT_CLINIC_OR_DEPARTMENT_OTHER): Payer: BLUE CROSS/BLUE SHIELD

## 2015-12-01 ENCOUNTER — Telehealth: Payer: Self-pay

## 2015-12-01 DIAGNOSIS — C541 Malignant neoplasm of endometrium: Secondary | ICD-10-CM | POA: Diagnosis not present

## 2015-12-01 LAB — COMPREHENSIVE METABOLIC PANEL
ALK PHOS: 96 U/L (ref 40–150)
ALT: 32 U/L (ref 0–55)
ANION GAP: 7 meq/L (ref 3–11)
AST: 22 U/L (ref 5–34)
Albumin: 3.8 g/dL (ref 3.5–5.0)
BILIRUBIN TOTAL: 0.44 mg/dL (ref 0.20–1.20)
BUN: 7.8 mg/dL (ref 7.0–26.0)
CALCIUM: 9.7 mg/dL (ref 8.4–10.4)
CO2: 27 mEq/L (ref 22–29)
CREATININE: 0.8 mg/dL (ref 0.6–1.1)
Chloride: 105 mEq/L (ref 98–109)
EGFR: 74 mL/min/{1.73_m2} — AB (ref >90)
GLUCOSE: 85 mg/dL (ref 70–140)
Potassium: 4.1 mEq/L (ref 3.5–5.1)
SODIUM: 140 meq/L (ref 136–145)
Total Protein: 6.7 g/dL (ref 6.4–8.3)

## 2015-12-01 LAB — CBC WITH DIFFERENTIAL/PLATELET
BASO%: 0.5 % (ref 0.0–2.0)
Basophils Absolute: 0 10*3/uL (ref 0.0–0.1)
EOS%: 0.9 % (ref 0.0–7.0)
Eosinophils Absolute: 0 10*3/uL (ref 0.0–0.5)
HCT: 39.7 % (ref 34.8–46.6)
HEMOGLOBIN: 13.2 g/dL (ref 11.6–15.9)
LYMPH%: 38.4 % (ref 14.0–49.7)
MCH: 33.9 pg (ref 25.1–34.0)
MCHC: 33.2 g/dL (ref 31.5–36.0)
MCV: 102.1 fL — ABNORMAL HIGH (ref 79.5–101.0)
MONO#: 1 10*3/uL — ABNORMAL HIGH (ref 0.1–0.9)
MONO%: 22.7 % — AB (ref 0.0–14.0)
NEUT%: 37.5 % — ABNORMAL LOW (ref 38.4–76.8)
NEUTROS ABS: 1.6 10*3/uL (ref 1.5–6.5)
Platelets: 143 10*3/uL — ABNORMAL LOW (ref 145–400)
RBC: 3.89 10*6/uL (ref 3.70–5.45)
RDW: 13.2 % (ref 11.2–14.5)
WBC: 4.3 10*3/uL (ref 3.9–10.3)
lymph#: 1.7 10*3/uL (ref 0.9–3.3)

## 2015-12-01 NOTE — Telephone Encounter (Signed)
-----   Message from Gordy Levan, MD sent at 11/30/2015  2:02 PM EDT ----- For CBC on 5-2  Trying to see if past nadir or still dropping, if needs more granix   For CBC CMET on 5-10 prior to chemo on 5-11   Need to let her know if ok for decadron that night   Will need refill decadron ok to do enough for 2 cycles 1 refill

## 2015-12-01 NOTE — Progress Notes (Signed)
Medical Oncology  Peer to peer done for granix auth # for granix from 5-1 thru 04-15-16:   [id number]  Message sent to [facility] managed care  L.Marko Plume, MD

## 2015-12-01 NOTE — Telephone Encounter (Signed)
Told Jacqueline Hammond that [name] is 1.6 and platelet count is 143k today.  Dr. Marko Plume said that she did not need another Granix injection today.  Keep appointment Wednesday 12-09-15 for lab to see if counts ok for treatment 12-10-15.  Jacqueline Hammond verbalized understanding.

## 2015-12-03 ENCOUNTER — Other Ambulatory Visit: Payer: BLUE CROSS/BLUE SHIELD

## 2015-12-03 ENCOUNTER — Ambulatory Visit: Payer: BLUE CROSS/BLUE SHIELD | Admitting: Oncology

## 2015-12-07 ENCOUNTER — Encounter: Payer: Self-pay | Admitting: Radiation Oncology

## 2015-12-07 NOTE — Progress Notes (Signed)
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    Biopsies revealed:   10/29/15 A: Lymph node, right periaortic, excision  - Negative for metastatic carcinoma by H&E stain (0/1)  B: Lymph node, right periaortic, sentinel, biopsy  - Negative for metastatic carcinoma by H&E and keratin stain (0/3)  C: Lymph node, left periaortic, sentinel, biopsy  - Negative for metastatic carcinoma by H&E and keratin stain (0/2)  D: Lymph node, right external iliac, sentinel, biopsy  - Isolated tumor cells present in 2 of 2 lymph nodes sampled on keratin stain only  E: Lymph node, left obturator, sentinel, biopsy  - Isolated tumor cells present in 2 of 2 lymph nodes sampled on H&E and keratin stain   F: Uterus, cervix, bilateral fallopian tubes and ovaries, robotic assisted total laparoscopic hysterectomy and bilateral salpingo-oophorectomy  - High-grade serous adenocarcinoma (pT2) - Tumor involves cervical stroma and outer half of myometrium - See comment and synoptic report for further details  Cervix: - Involved by serous carcinoma - Benign squamous mucosa and endocervical glands  Endomyometrium: - Involved by serous carcinoma - Background inactive endometrium - No myometrial lesions or nodules  Left ovary: - Benign ovarian parenchyma with inclusion cysts - No atypia or malignancy identified  Left fallopian tube: - Benign fallopian tube with mesonephric remnants - No atypia or malignancy identified  Right ovary: - Benign ovarian parenchyma with inclusion cysts - No atypia or malignancy identified  Right fallopian tube:  - Benign fallopian tube with mesonephric remnants - No atypia or malignancy identified  10/19/15 Diagnosis Endometrium, biopsy, uterus - POSITIVE FOR ENDOMETRIAL ADENOCARCINOMA.  Past/Anticipated interventions by  Gyn/Onc surgery, if any: robotic hysterectomy, BSO and sentinel lymph node biopsy of pelvic and PA nodes at [facility] with Dr Clarene Essex.   Past/Anticipated interventions by medical oncology, if any: Per Dr. Marko Plume "Expect 3 cycles of every 3 week carboplatin taxol beginning next week, then pelvic radiation, then 3 additional cycles of chemo.".  She has completed 2 cycles and the next will start on June 1.  Weight changes, if any: yes about 3-4 lbs.  Reports having a good appetite.  Bowel/Bladder complaints, if any: denies bladder issues, reports having constipation with first chemotherapy.    Nausea/Vomiting, if any: no  Pain issues, if any:  no  SAFETY ISSUES:  Prior radiation? no  Pacemaker/ICD? no  Possible current pregnancy? no  Is the patient on methotrexate? no  Current Complaints / other details:  Patient is here with her husband and sister in law.  BP 120/86 mmHg  Pulse 117  Temp(Src) 98.1 F (36.7 C) (Oral)  Ht 5\' 3"  (1.6 m)  Wt 135 lb 11.2 oz (61.553 kg)  BMI 24.04 kg/m2  SpO2 100%   Wt Readings from Last 3 Encounters:  12/16/15 135 lb 11.2 oz (61.553 kg)  11/30/15 138 lb 8 oz (62.823 kg)  11/26/15 135 lb 6.4 oz (61.417 kg)

## 2015-12-09 ENCOUNTER — Telehealth: Payer: Self-pay

## 2015-12-09 ENCOUNTER — Other Ambulatory Visit (HOSPITAL_BASED_OUTPATIENT_CLINIC_OR_DEPARTMENT_OTHER): Payer: BLUE CROSS/BLUE SHIELD

## 2015-12-09 DIAGNOSIS — C541 Malignant neoplasm of endometrium: Secondary | ICD-10-CM | POA: Diagnosis not present

## 2015-12-09 LAB — COMPREHENSIVE METABOLIC PANEL
ALBUMIN: 4 g/dL (ref 3.5–5.0)
ALK PHOS: 105 U/L (ref 40–150)
ALT: 20 U/L (ref 0–55)
ANION GAP: 8 meq/L (ref 3–11)
AST: 17 U/L (ref 5–34)
BUN: 13.9 mg/dL (ref 7.0–26.0)
CO2: 27 mEq/L (ref 22–29)
Calcium: 10 mg/dL (ref 8.4–10.4)
Chloride: 105 mEq/L (ref 98–109)
Creatinine: 1 mg/dL (ref 0.6–1.1)
EGFR: 63 mL/min/{1.73_m2} — AB (ref >90)
GLUCOSE: 97 mg/dL (ref 70–140)
POTASSIUM: 3.9 meq/L (ref 3.5–5.1)
SODIUM: 139 meq/L (ref 136–145)
Total Bilirubin: 0.37 mg/dL (ref 0.20–1.20)
Total Protein: 7.4 g/dL (ref 6.4–8.3)

## 2015-12-09 LAB — CBC WITH DIFFERENTIAL/PLATELET
BASO%: 0.4 % (ref 0.0–2.0)
BASOS ABS: 0 10*3/uL (ref 0.0–0.1)
EOS ABS: 0 10*3/uL (ref 0.0–0.5)
EOS%: 0.4 % (ref 0.0–7.0)
HCT: 41.5 % (ref 34.8–46.6)
HEMOGLOBIN: 13.7 g/dL (ref 11.6–15.9)
LYMPH%: 33.5 % (ref 14.0–49.7)
MCH: 34 pg (ref 25.1–34.0)
MCHC: 33.1 g/dL (ref 31.5–36.0)
MCV: 102.8 fL — AB (ref 79.5–101.0)
MONO#: 0.5 10*3/uL (ref 0.1–0.9)
MONO%: 6.4 % (ref 0.0–14.0)
NEUT#: 4.2 10*3/uL (ref 1.5–6.5)
NEUT%: 59.3 % (ref 38.4–76.8)
Platelets: 161 10*3/uL (ref 145–400)
RBC: 4.03 10*6/uL (ref 3.70–5.45)
RDW: 13.8 % (ref 11.2–14.5)
WBC: 7.1 10*3/uL (ref 3.9–10.3)
lymph#: 2.4 10*3/uL (ref 0.9–3.3)

## 2015-12-09 NOTE — Telephone Encounter (Signed)
Jacqueline Hammond waited for her lab results, gave her copy of CBC stated it looked good for chemo tomorrow. Will call her if anything on CMET precludes chemo. She stated she knew her decadron times. Gave pt a wig voucher.

## 2015-12-09 NOTE — Telephone Encounter (Signed)
-----   Message from Gordy Levan, MD sent at 11/30/2015  2:02 PM EDT ----- For CBC on 5-2  Trying to see if past nadir or still dropping, if needs more granix   For CBC CMET on 5-10 prior to chemo on 5-11   Need to let her know if ok for decadron that night   Will need refill decadron ok to do enough for 2 cycles 1 refill

## 2015-12-10 ENCOUNTER — Ambulatory Visit (HOSPITAL_BASED_OUTPATIENT_CLINIC_OR_DEPARTMENT_OTHER): Payer: BLUE CROSS/BLUE SHIELD

## 2015-12-10 ENCOUNTER — Other Ambulatory Visit (HOSPITAL_BASED_OUTPATIENT_CLINIC_OR_DEPARTMENT_OTHER): Payer: BLUE CROSS/BLUE SHIELD

## 2015-12-10 VITALS — BP 136/89 | HR 100 | Temp 97.8°F | Resp 18

## 2015-12-10 DIAGNOSIS — C541 Malignant neoplasm of endometrium: Secondary | ICD-10-CM | POA: Diagnosis not present

## 2015-12-10 DIAGNOSIS — Z5111 Encounter for antineoplastic chemotherapy: Secondary | ICD-10-CM

## 2015-12-10 LAB — COMPREHENSIVE METABOLIC PANEL
ALK PHOS: 108 U/L (ref 40–150)
ALT: 23 U/L (ref 0–55)
ANION GAP: 10 meq/L (ref 3–11)
AST: 16 U/L (ref 5–34)
Albumin: 4.1 g/dL (ref 3.5–5.0)
BILIRUBIN TOTAL: 0.35 mg/dL (ref 0.20–1.20)
BUN: 13.4 mg/dL (ref 7.0–26.0)
CALCIUM: 10.2 mg/dL (ref 8.4–10.4)
CHLORIDE: 107 meq/L (ref 98–109)
CO2: 21 mEq/L — ABNORMAL LOW (ref 22–29)
CREATININE: 0.9 mg/dL (ref 0.6–1.1)
EGFR: 71 mL/min/{1.73_m2} — ABNORMAL LOW (ref >90)
Glucose: 147 mg/dl — ABNORMAL HIGH (ref 70–140)
Potassium: 3.9 mEq/L (ref 3.5–5.1)
Sodium: 138 mEq/L (ref 136–145)
TOTAL PROTEIN: 7.7 g/dL (ref 6.4–8.3)

## 2015-12-10 LAB — CBC WITH DIFFERENTIAL/PLATELET
BASO%: 0 % (ref 0.0–2.0)
Basophils Absolute: 0 10*3/uL (ref 0.0–0.1)
EOS%: 0 % (ref 0.0–7.0)
Eosinophils Absolute: 0 10*3/uL (ref 0.0–0.5)
HEMATOCRIT: 39 % (ref 34.8–46.6)
HEMOGLOBIN: 13.5 g/dL (ref 11.6–15.9)
LYMPH#: 0.8 10*3/uL — AB (ref 0.9–3.3)
LYMPH%: 7.7 % — ABNORMAL LOW (ref 14.0–49.7)
MCH: 34.7 pg — ABNORMAL HIGH (ref 25.1–34.0)
MCHC: 34.6 g/dL (ref 31.5–36.0)
MCV: 100.3 fL (ref 79.5–101.0)
MONO#: 0 10*3/uL — AB (ref 0.1–0.9)
MONO%: 0.1 % (ref 0.0–14.0)
NEUT%: 92.2 % — ABNORMAL HIGH (ref 38.4–76.8)
NEUTROS ABS: 9.5 10*3/uL — AB (ref 1.5–6.5)
PLATELETS: 164 10*3/uL (ref 145–400)
RBC: 3.89 10*6/uL (ref 3.70–5.45)
RDW: 13.7 % (ref 11.2–14.5)
WBC: 10.3 10*3/uL (ref 3.9–10.3)

## 2015-12-10 MED ORDER — SODIUM CHLORIDE 0.9 % IV SOLN
Freq: Once | INTRAVENOUS | Status: AC
  Administered 2015-12-10: 12:00:00 via INTRAVENOUS
  Filled 2015-12-10: qty 4

## 2015-12-10 MED ORDER — DIPHENHYDRAMINE HCL 50 MG/ML IJ SOLN
50.0000 mg | Freq: Once | INTRAMUSCULAR | Status: AC
  Administered 2015-12-10: 50 mg via INTRAVENOUS

## 2015-12-10 MED ORDER — FAMOTIDINE IN NACL 20-0.9 MG/50ML-% IV SOLN
20.0000 mg | Freq: Once | INTRAVENOUS | Status: AC
  Administered 2015-12-10: 20 mg via INTRAVENOUS

## 2015-12-10 MED ORDER — SODIUM CHLORIDE 0.9 % IV SOLN
Freq: Once | INTRAVENOUS | Status: AC
  Administered 2015-12-10: 13:00:00 via INTRAVENOUS
  Filled 2015-12-10: qty 5

## 2015-12-10 MED ORDER — SODIUM CHLORIDE 0.9 % IV SOLN
175.0000 mg/m2 | Freq: Once | INTRAVENOUS | Status: AC
  Administered 2015-12-10: 294 mg via INTRAVENOUS
  Filled 2015-12-10: qty 49

## 2015-12-10 MED ORDER — SODIUM CHLORIDE 0.9 % IV SOLN
Freq: Once | INTRAVENOUS | Status: AC
  Administered 2015-12-10: 12:00:00 via INTRAVENOUS

## 2015-12-10 MED ORDER — FAMOTIDINE IN NACL 20-0.9 MG/50ML-% IV SOLN
INTRAVENOUS | Status: AC
  Filled 2015-12-10: qty 50

## 2015-12-10 MED ORDER — SODIUM CHLORIDE 0.9 % IV SOLN
531.6000 mg | Freq: Once | INTRAVENOUS | Status: AC
  Administered 2015-12-10: 530 mg via INTRAVENOUS
  Filled 2015-12-10: qty 53

## 2015-12-10 MED ORDER — DIPHENHYDRAMINE HCL 50 MG/ML IJ SOLN
INTRAMUSCULAR | Status: AC
  Filled 2015-12-10: qty 1

## 2015-12-10 NOTE — Patient Instructions (Signed)
[facility] Discharge Instructions for Patients Receiving Chemotherapy  Today you received the following chemotherapy agents Taxol and Carboplatin.  To help prevent nausea and vomiting after your treatment, we encourage you to take your nausea medication as prescribed.   If you develop nausea and vomiting that is not controlled by your nausea medication, call the clinic.   BELOW ARE SYMPTOMS THAT SHOULD BE REPORTED IMMEDIATELY:  *FEVER GREATER THAN 100.5 F  *CHILLS WITH OR WITHOUT FEVER  NAUSEA AND VOMITING THAT IS NOT CONTROLLED WITH YOUR NAUSEA MEDICATION  *UNUSUAL SHORTNESS OF BREATH  *UNUSUAL BRUISING OR BLEEDING  TENDERNESS IN MOUTH AND THROAT WITH OR WITHOUT PRESENCE OF ULCERS  *URINARY PROBLEMS  *BOWEL PROBLEMS  UNUSUAL RASH Items with * indicate a potential emergency and should be followed up as soon as possible.  Feel free to call the clinic you have any questions or concerns. The clinic phone number is [phone].  Please show the CHEMO ALERT CARD at check-in to the Emergency Department and triage nurse.   

## 2015-12-13 ENCOUNTER — Other Ambulatory Visit: Payer: Self-pay | Admitting: Oncology

## 2015-12-16 ENCOUNTER — Ambulatory Visit
Admission: RE | Admit: 2015-12-16 | Discharge: 2015-12-16 | Disposition: A | Discharge status: Home / Self Care | Payer: BLUE CROSS/BLUE SHIELD | Source: Ambulatory Visit | Attending: Radiation Oncology | Admitting: Radiation Oncology

## 2015-12-16 ENCOUNTER — Encounter: Payer: Self-pay | Admitting: Radiation Oncology

## 2015-12-16 VITALS — BP 120/86 | HR 117 | Temp 98.1°F | Ht 63.0 in | Wt 135.7 lb

## 2015-12-16 DIAGNOSIS — C7982 Secondary malignant neoplasm of genital organs: Secondary | ICD-10-CM

## 2015-12-16 DIAGNOSIS — Z510 Encounter for antineoplastic radiation therapy: Secondary | ICD-10-CM | POA: Insufficient documentation

## 2015-12-16 DIAGNOSIS — C541 Malignant neoplasm of endometrium: Secondary | ICD-10-CM | POA: Diagnosis present

## 2015-12-16 DIAGNOSIS — C775 Secondary and unspecified malignant neoplasm of intrapelvic lymph nodes: Secondary | ICD-10-CM

## 2015-12-16 NOTE — Progress Notes (Signed)
Radiation Oncology         [phone] ________________________________  Initial Outpatient Consultation  Name: Jacqueline Hammond MRN: [id number]  Date: 12/16/2015  DOB: [birth year]  [id number] Yong Channel, MD  Dorothyann Gibbs, NP   REFERRING PHYSICIAN: [name], NP  DIAGNOSIS: The encounter diagnosis was Endometrial cancer, FIGO stage IIIC ([place]).  HISTORY OF PRESENT ILLNESS::Jacqueline Hammond is a 65 y.o. female who originally presented to her gynecologist, Dr. Phineas Real, complaining of some spotting. A Pap smear was performed, revealing atypical glandular cells. 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 The patient received surgery at [facility] with Dr. Clarene Essex on 10/29/2015. She underwent a robotic hysterectomy, BSO and sentinel lymph node biopsy of pelvic and PA nodes at [facility] with Dr Clarene Essex. Surgery was uncomplicated. Postoperative pathology revealed a 46mm tumor of high grade serous adenocarcinoma involving the cervical stroma with no LVSI. There was outer half myometrial infiltration (12 of 41mm). The left and right pelvic SLN's were positive for ITC's on IHC.  Combination therapy in a sandwich fashion is been recommended for the patient. She is receiving chemotherapy with Dr. Marko Plume of medical oncology. She has finished two cycles.     PREVIOUS RADIATION THERAPY: No  PAST MEDICAL HISTORY:  has a past medical history of Hyperlipidemia; Atypical glandular cells of undetermined significance (AGUS) on cervical Pap smear (09/2015); Adenocarcinoma of endometrium ([place]) (09/2015); and Osteopenia (09/2015).    PAST SURGICAL HISTORY: Past Surgical History  Procedure Laterality Date  . Tubal ligation    . Laparoscopy    . Robotic assisted total hysterectomy with bilateral salpingo oopherectomy  10/29/15    robotic hysterectomy, BSO and sentinel lymph node biopsy of pelvic and PA nodes at [facility] with Dr Clarene Essex.      FAMILY HISTORY: family history includes Cancer in her mother; Heart attack (age of onset: 71) in her brother and father; Heart failure in her father; Melanoma in her father.  SOCIAL HISTORY:  reports that she has been smoking Cigarettes.  She has a 8 pack-year smoking history. She does not have any smokeless tobacco history on file. She reports that she does not drink alcohol or use illicit drugs.  ALLERGIES: Codeine sulfate and Neomycin-bacitracin zn-polymyx  MEDICATIONS:  Current Outpatient Prescriptions  Medication Sig Dispense Refill  . atorvastatin (LIPITOR) 20 MG tablet TAKE 1 TABLET BY MOUTH EVERY DAY (Patient taking differently: TAKE 1 TABLET BY MOUTH EVERY OTHER DAY) 30 tablet 5  . dexamethasone (DECADRON) 4 MG tablet Take 5 tablets with food 12 hrs and 6 hrs before Taxol Chemotherapy 20 tablet 1  . LORazepam (ATIVAN) 0.5 MG tablet Place 1 tablet under the tongue or swallow every 6 hrs as needed for nausea.  Will make Drowsy. 20 tablet 0  . polyethylene glycol (MIRALAX / GLYCOLAX) packet Take 17 g by mouth 2 (two) times daily as needed.    . ondansetron (ZOFRAN) 8 MG tablet Take 1 tablet (8 mg total) by mouth every 8 (eight) hours as needed for nausea or vomiting (Will not make Drowsy.). (Patient not taking: Reported on 11/30/2015) 30 tablet 1   No current facility-administered medications for this encounter.    REVIEW OF SYSTEMS:  A 15 point review of systems is documented in the electronic medical record. This was obtained by the nursing staff. However, I reviewed this with the patient to discuss relevant findings and make appropriate changes.     65 year-old  woman. Accompanied by her husband and sister-in-law. Retired for 10 years. She has begun chemotherapy and received one Neulasta shot. Denies complications since surgery. Denies being sick on her stomach at all. Reports mild fatigue. She has not taken much pain medication. Admits her bowel issues are "self-induced" explaining  she is not taking her medication in time to manage her bowels completely. Reports a weight loss of about 3-4 lbs. Reports having a good appetite. Denies bladder issues. Reports having constipation with first chemotherapy. Denies nausea or vomiting. Denies headaches, double vision, or blurred vision.   PHYSICAL EXAM:  height is 5\' 3"  (1.6 m) and weight is 135 lb 11.2 oz (61.553 kg). Her oral temperature is 98.1 F (36.7 C). Her blood pressure is 120/86 and her pulse is 117. Her oxygen saturation is 100%.     General: Alert and oriented, in no acute distress HEENT: Head is normocephalic. Extraocular movements are intact. Oropharynx is clear. Dentures on top. Neck: Neck is supple, no palpable cervical or supraclavicular lymphadenopathy. Heart: Regular in rate and rhythm with no murmurs, rubs, or gallops. Chest: Clear to auscultation bilaterally, with no rhonchi, wheezes, or rales. Abdomen: Soft, nontender, nondistended, with no rigidity or guarding. Five small scars from her laparoscopic surgery. No signs of infection or drainage. Extremities: No cyanosis or edema. Skin: No concerning lesions. Musculoskeletal: symmetric strength and muscle tone throughout. Neurologic: Cranial nerves II through XII are grossly intact. No obvious focalities. Speech is fluent. Coordination is intact. Psychiatric: Judgment and insight are intact. Affect is appropriate. Pelvic exam deferred until simulation and planning day  ECOG = 1  LABORATORY DATA:  Lab Results  Component Value Date   WBC 10.3 12/10/2015   HGB 13.5 12/10/2015   HCT 39.0 12/10/2015   MCV 100.3 12/10/2015   PLT 164 12/10/2015   NEUTROABS 9.5* 12/10/2015   Lab Results  Component Value Date   NA 138 12/10/2015   K 3.9 12/10/2015   CL 106 10/10/2014   CO2 21* 12/10/2015   GLUCOSE 147* 12/10/2015   CREATININE 0.9 12/10/2015   CALCIUM 10.2 12/10/2015      RADIOGRAPHY: No results found.    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 She would be at high risk of recurrence, both distant and local recurrence in light of her cervical extent and histologic findings and would likely benefit from radiation therapy as part of her adjuvant treatment. This would consist of external beam radiation therapy and intracavitary brachytherapy treatments.  PLAN: Today, I talked to the patient and family about the findings and work-up thus far.  We discussed the natural history of high grade serous endometrial adenocarcinoma and general treatment, highlighting the role of radiotherapy in the management.  We discussed the available radiation techniques including IMRT and HDR brachytherapy, and focused on the details of logistics and delivery.  We reviewed the anticipated acute and late sequelae associated with radiation in this setting.  The patient was encouraged to ask questions that I answered to the best of my ability. The patient would like to proceed with radiation and has been scheduled for CT simulation on June 6th.      ------------------------------------------------  Blair Promise, PhD, MD     This document serves as a record of services personally performed by Gery Pray, MD. It was created on his behalf by Arlyce Harman, a trained medical scribe. The creation of this record is based on the scribe's personal observations and the provider's statements to them. This document  has been checked and approved by the attending provider.

## 2015-12-16 NOTE — Progress Notes (Signed)
Please see the Nurse Progress Note in the MD Initial Consult Encounter for this patient. 

## 2015-12-17 ENCOUNTER — Other Ambulatory Visit (HOSPITAL_BASED_OUTPATIENT_CLINIC_OR_DEPARTMENT_OTHER): Payer: BLUE CROSS/BLUE SHIELD

## 2015-12-17 ENCOUNTER — Encounter: Payer: Self-pay | Admitting: Oncology

## 2015-12-17 ENCOUNTER — Ambulatory Visit (HOSPITAL_BASED_OUTPATIENT_CLINIC_OR_DEPARTMENT_OTHER): Payer: BLUE CROSS/BLUE SHIELD | Admitting: Oncology

## 2015-12-17 ENCOUNTER — Telehealth: Payer: Self-pay | Admitting: Oncology

## 2015-12-17 VITALS — BP 124/78 | HR 90 | Temp 98.0°F | Resp 18 | Ht 63.0 in | Wt 135.7 lb

## 2015-12-17 DIAGNOSIS — G2581 Restless legs syndrome: Secondary | ICD-10-CM

## 2015-12-17 DIAGNOSIS — G4700 Insomnia, unspecified: Secondary | ICD-10-CM | POA: Diagnosis not present

## 2015-12-17 DIAGNOSIS — M8580 Other specified disorders of bone density and structure, unspecified site: Secondary | ICD-10-CM

## 2015-12-17 DIAGNOSIS — K5909 Other constipation: Secondary | ICD-10-CM

## 2015-12-17 DIAGNOSIS — C541 Malignant neoplasm of endometrium: Secondary | ICD-10-CM

## 2015-12-17 DIAGNOSIS — Z720 Tobacco use: Secondary | ICD-10-CM

## 2015-12-17 LAB — CBC WITH DIFFERENTIAL/PLATELET
BASO%: 0.5 % (ref 0.0–2.0)
BASOS ABS: 0 10*3/uL (ref 0.0–0.1)
EOS%: 0 % (ref 0.0–7.0)
Eosinophils Absolute: 0 10*3/uL (ref 0.0–0.5)
HEMATOCRIT: 35.9 % (ref 34.8–46.6)
HGB: 12.4 g/dL (ref 11.6–15.9)
LYMPH#: 2.1 10*3/uL (ref 0.9–3.3)
LYMPH%: 53.2 % — AB (ref 14.0–49.7)
MCH: 34.7 pg — ABNORMAL HIGH (ref 25.1–34.0)
MCHC: 34.5 g/dL (ref 31.5–36.0)
MCV: 100.6 fL (ref 79.5–101.0)
MONO#: 0.2 10*3/uL (ref 0.1–0.9)
MONO%: 4.4 % (ref 0.0–14.0)
NEUT#: 1.6 10*3/uL (ref 1.5–6.5)
NEUT%: 41.9 % (ref 38.4–76.8)
PLATELETS: 159 10*3/uL (ref 145–400)
RBC: 3.57 10*6/uL — ABNORMAL LOW (ref 3.70–5.45)
RDW: 13.3 % (ref 11.2–14.5)
WBC: 3.9 10*3/uL (ref 3.9–10.3)

## 2015-12-17 LAB — COMPREHENSIVE METABOLIC PANEL
ALBUMIN: 3.8 g/dL (ref 3.5–5.0)
ALK PHOS: 91 U/L (ref 40–150)
ALT: 38 U/L (ref 0–55)
ANION GAP: 6 meq/L (ref 3–11)
AST: 19 U/L (ref 5–34)
BUN: 11.2 mg/dL (ref 7.0–26.0)
CALCIUM: 9.4 mg/dL (ref 8.4–10.4)
CO2: 28 mEq/L (ref 22–29)
CREATININE: 0.8 mg/dL (ref 0.6–1.1)
Chloride: 105 mEq/L (ref 98–109)
EGFR: 79 mL/min/{1.73_m2} — ABNORMAL LOW (ref >90)
Glucose: 94 mg/dl (ref 70–140)
Potassium: 3.4 mEq/L — ABNORMAL LOW (ref 3.5–5.1)
Sodium: 138 mEq/L (ref 136–145)
TOTAL PROTEIN: 6.6 g/dL (ref 6.4–8.3)

## 2015-12-17 MED ORDER — TEMAZEPAM 15 MG PO CAPS: 15.0000 mg | ORAL_CAPSULE | Freq: Every evening | ORAL | Status: DC | PRN

## 2015-12-17 NOTE — Telephone Encounter (Signed)
appt made and avs printed °

## 2015-12-17 NOTE — Progress Notes (Signed)
OFFICE PROGRESS NOTE   Dec 17, 2015   PHYSICIANS: Terrence Dupont Rossi/ Cindie Laroche, Marin Olp, MD (PCP, Abbie Sons, Scarlette Shorts, Gery Pray    INTERVAL HISTORY:  Patient is seen, alone for visit, in continuing attention to IIIC1 high grade serous carcinoma of endometrium, having had cycle 2 carbo taxol on 12-10-15. She has not had gCSF as yet cycle 2. She saw Dr Sondra Come in consultation on 12-16-15, plan IMRT and HDR brachytherapy after cycle 3 chemo, simulation planned 01-05-16.  Patient has done better overall with cycle 2, particularly with less constipation using miralax and colace bid following chemo. Bowels are moving well now without laxatives. She had no nausea and did not need prn antiemetics,  tho decreased appetite x 4 days, still was able to eat some and to drink liquids. She has been fatigued, not sleeping even with ativan at hs and will try Restoril now. She has history of occasional restless legs x years, with those symptoms severe on day 5, seemed better after began gatorade. She has decreased cigarettes to 1-3 daily, most difficult time just after meals. No fever ,no bleeding, bladder ok, no LE swelling, no abdominal or pelvic pain, no increased SOB. No problems with peripheral IV access cycle 2. No peripheral neuropathy Remainder of 10 point Review of Systems negative.   No central catheter   ONCOLOGIC HISTORY Patient presented to Dr Phineas Real as new patient 10-01-15 after noticing recent slight vaginal spotting. PAP had atypical glandular cells, then sonohysterogram and endometrial biopsy on 10-19-15. The uterus was 7.4 x 3.5 x 3.0 cm with endometrial stripe of 6.8 mm. Endometrial biopsy ([id number] from [facility]) showed endometrial carcinoma which appeared high grade with serous features. She was seen by Dr Josephina Shih on 10-23-15, exam not remarkable. She had CT CAP 10-28-15 with no evidence of metastatic disease, incidental atherosclerosis and ectatic abdominal aorta. She  had surgery at [facility] by Dr Cindie Laroche on 10-29-15, which was robotic hysterectomy BSO with pelvic and paraaortic sentinel node evaluation. Intraoperative findings were of small uterus with normal tubes and ovaries. Adhesions of bladder due to uterine fundus from prior surgery, bilateral pelvic sentinel pelvic mapping to left obturator space node and to right external iliac node, 3 right paraaortic nodes mapped as sentinel nodes, normal upper abdominal survey. Pathology [phone] had high grade serous adenocarcinoma of uterus involving cervical stroma and outer half of myometrium, isolated tumor cells in 2/2 right external iliac and 2/2 left obturator nodes, negative right and left periaortics, bilateral tubes and ovaries benign. Immunohistochemical stains confirmed serous carcinoma, ER and PR negative. [facility] labs 10-27-15 (preoperative) included CA 125 of 12.1, WBC 10.5, Hgb 14.5 and platelets 290k. [facility] gyn oncology multidisciplinary conference 11-04-15 recommended 6 cycles carboplatin taxol given in sandwich fashion with pelvic radiation and vaginal brachytherapy. First carboplatin taxol given 11-20-15. She was leukopenic with counts not at nadir day 11, ANC 1.2, given granix x 1.      Objective:  Vital signs in last 24 hours:  BP 124/78 mmHg  Pulse 90  Temp(Src) 98 F (36.7 C) (Oral)  Resp 18  Ht '5\' 3"'$  (1.6 m)  Wt 135 lb 11.2 oz (61.553 kg)  BMI 24.04 kg/m2  SpO2 100% Weight down 3 lb. Alert, oriented and appropriate, very pleasant as always. Ambulatory without difficulty.  Alopecia. Occasional cough, respirations not labored RA  HEENT:PERRL, sclerae not icteric. Oral mucosa moist without lesions, posterior pharynx clear.  Neck supple. No JVD.  Lymphatics:no cervical,supraclavicular, axillary or inguinal adenopathy  Resp: somewhat diminished BS thruout otherwise clear to auscultation bilaterally and no dullness to  percussion bilaterally Cardio: regular rate and rhythm. No gallop. GI: soft,  nontender, not distended, no mass or organomegaly. Normally active bowel sounds. Surgical incision not remarkable. Musculoskeletal/ Extremities: without pitting edema, cords, tenderness Neuro: no peripheral neuropathy. Otherwise nonfocal. PSYCH appropriate mood and affect Skin without rash, ecchymosis, petechiae   Lab Results:  Results for orders placed or performed in visit on 12/17/15  CBC with Differential  Result Value Ref Range   WBC 3.9 3.9 - 10.3 10e3/uL   NEUT# 1.6 1.5 - 6.5 10e3/uL   HGB 12.4 11.6 - 15.9 g/dL   HCT 35.9 34.8 - 46.6 %   Platelets 159 145 - 400 10e3/uL   MCV 100.6 79.5 - 101.0 fL   MCH 34.7 (H) 25.1 - 34.0 pg   MCHC 34.5 31.5 - 36.0 g/dL   RBC 3.57 (L) 3.70 - 5.45 10e6/uL   RDW 13.3 11.2 - 14.5 %   lymph# 2.1 0.9 - 3.3 10e3/uL   MONO# 0.2 0.1 - 0.9 10e3/uL   Eosinophils Absolute 0.0 0.0 - 0.5 10e3/uL   Basophils Absolute 0.0 0.0 - 0.1 10e3/uL   NEUT% 41.9 38.4 - 76.8 %   LYMPH% 53.2 (H) 14.0 - 49.7 %   MONO% 4.4 0.0 - 14.0 %   EOS% 0.0 0.0 - 7.0 %   BASO% 0.5 0.0 - 2.0 %  Comprehensive metabolic panel  Result Value Ref Range   Sodium 138 136 - 145 mEq/L   Potassium 3.4 (L) 3.5 - 5.1 mEq/L   Chloride 105 98 - 109 mEq/L   CO2 28 22 - 29 mEq/L   Glucose 94 70 - 140 mg/dl   BUN 11.2 7.0 - 26.0 mg/dL   Creatinine 0.8 0.6 - 1.1 mg/dL   Total Bilirubin <0.30 0.20 - 1.20 mg/dL   Alkaline Phosphatase 91 40 - 150 U/L   AST 19 5 - 34 U/L   ALT 38 0 - 55 U/L   Total Protein 6.6 6.4 - 8.3 g/dL   Albumin 3.8 3.5 - 5.0 g/dL   Calcium 9.4 8.4 - 10.4 mg/dL   Anion Gap 6 3 - 11 mEq/L   EGFR 79 (L) >90 ml/min/1.73 m2     Studies/Results:  No results found.  Medications: I have reviewed the patient's current medications. Try Restoril 15 mg qhs prn sleep OK to try ativan if restless legs  DISCUSSION Interval history reviewed as above, including meds, bowels, fatigue. If she is more fatigued or other symptoms concerning for lower blood counts in next 7-10  days, recheck CBC if so.  She is concerned that restless legs may make it difficult for her to tolerate simulation on 01-05-16. Will begin gatorade day of chemo and try prn ativan as above. She did not get restless legs with benadryl during chemo infusion.   Discussed tobacco cessation strategies as patient is actively trying to quit smoking.  Assessment/Plan:  1.IIIC1 serous high grade endometrial carcinoma with cervical stromal involvement and isolated tumor cells identified in bilateral sentinel pelvic nodes: robotic hysterectomy BSO pelvic and periaortic node evaluation at [facility] 10-29-15. Plan chemotherapy + radiation in sandwich fashion. She will have cycle 3 on 12-31-15 as long as [place] >=1.5 and plt >=100k by labs day prior.  2.Chemo leukopenia day 11 cycle 2: counts not at nadir but ok today. Will recheck CBC in next ~ week if she feels more fatigued or other concerns, otherwise will check  day prior to cycle 3 and I will see her ~ 2 weeks after that treatment also with labs. 3.long and ongoing tobacco abuse: she has cut back to 1-3 cigarettes/ 24 hrs. Smoking cessation encouraged. 4. Overdue mammogram, last at [place]. Four benign colon polyps at colonoscopy by Dr Henrene Pastor 06-2008: repeat per GI 6.osteopenia by bone density scan 09-2015 7.elevated lipids followed by PCP 8.hx BTL 9.radiology evidence of atherosclerosis thoracic and abdominal, and ectatic abdominal aorta. Recommendation to follow up with [place] in 5 years, cc this information to PCP 10. mild leukocytosis and macrocytosis without anemia at consultation visit 11-13-15: mildly elevated WBC thought post op with no symptoms of infection, macrocytosis slightly increased from a year ago, B12 and folate normal 09-2014, denies ETOH. Follow 11.Constipation after surgery and after cycle 1 chemo: resolved after several days of oral laxatives 12.restless legs several days after cycle 2 chemo, history of similar problem in years past. Plan as  above. 13. Peripheral IV access still adequate for treatment.   Lab and chemo orders placed. Message to RN to follow up labs day prior to treatment. All questions answered and patient is in agreement with recommendations and plans. Time spent 25 min including >50% counseling and coordination of care. Route PCP, cc Dr Christain Sacramento, MD   12/17/2015, 4:43 PM

## 2015-12-18 ENCOUNTER — Other Ambulatory Visit: Payer: Self-pay

## 2015-12-18 ENCOUNTER — Other Ambulatory Visit: Payer: Self-pay | Admitting: Oncology

## 2015-12-18 DIAGNOSIS — C541 Malignant neoplasm of endometrium: Secondary | ICD-10-CM

## 2015-12-18 DIAGNOSIS — G2581 Restless legs syndrome: Secondary | ICD-10-CM | POA: Insufficient documentation

## 2015-12-18 MED ORDER — LORAZEPAM 0.5 MG PO TABS: ORAL_TABLET | ORAL | Status: DC

## 2015-12-18 NOTE — Telephone Encounter (Signed)
Patient Demographics     Patient Name Sex DOB SSN Address Phone    Jacqueline Hammond, Jacqueline Hammond Female January 09, 1951 [id number] [address] [place] [address] [phone] [facility]) [phone] (Mobile) *Preferred*      Message  Received: Today    Gordy Levan, MD  Baruch Merl, RN           Ok to refill ativan   thanks

## 2015-12-30 ENCOUNTER — Other Ambulatory Visit (HOSPITAL_BASED_OUTPATIENT_CLINIC_OR_DEPARTMENT_OTHER): Payer: BLUE CROSS/BLUE SHIELD

## 2015-12-30 ENCOUNTER — Telehealth: Payer: Self-pay

## 2015-12-30 DIAGNOSIS — C541 Malignant neoplasm of endometrium: Secondary | ICD-10-CM

## 2015-12-30 LAB — COMPREHENSIVE METABOLIC PANEL
ALT: 18 U/L (ref 0–55)
AST: 17 U/L (ref 5–34)
Albumin: 3.8 g/dL (ref 3.5–5.0)
Alkaline Phosphatase: 95 U/L (ref 40–150)
Anion Gap: 9 mEq/L (ref 3–11)
BUN: 9.8 mg/dL (ref 7.0–26.0)
CHLORIDE: 106 meq/L (ref 98–109)
CO2: 25 meq/L (ref 22–29)
CREATININE: 0.9 mg/dL (ref 0.6–1.1)
Calcium: 9.6 mg/dL (ref 8.4–10.4)
EGFR: 69 mL/min/{1.73_m2} — ABNORMAL LOW (ref >90)
Glucose: 93 mg/dl (ref 70–140)
Potassium: 4 mEq/L (ref 3.5–5.1)
SODIUM: 140 meq/L (ref 136–145)
Total Bilirubin: 0.33 mg/dL (ref 0.20–1.20)
Total Protein: 6.9 g/dL (ref 6.4–8.3)

## 2015-12-30 LAB — CBC WITH DIFFERENTIAL/PLATELET
BASO%: 0.4 % (ref 0.0–2.0)
BASOS ABS: 0 10*3/uL (ref 0.0–0.1)
EOS ABS: 0 10*3/uL (ref 0.0–0.5)
EOS%: 0.3 % (ref 0.0–7.0)
HCT: 36.6 % (ref 34.8–46.6)
HGB: 12.3 g/dL (ref 11.6–15.9)
LYMPH%: 37.2 % (ref 14.0–49.7)
MCH: 35 pg — AB (ref 25.1–34.0)
MCHC: 33.6 g/dL (ref 31.5–36.0)
MCV: 103.9 fL — AB (ref 79.5–101.0)
MONO#: 0.6 10*3/uL (ref 0.1–0.9)
MONO%: 7.9 % (ref 0.0–14.0)
NEUT%: 54.2 % (ref 38.4–76.8)
NEUTROS ABS: 4 10*3/uL (ref 1.5–6.5)
Platelets: 126 10*3/uL — ABNORMAL LOW (ref 145–400)
RBC: 3.52 10*6/uL — AB (ref 3.70–5.45)
RDW: 14.7 % — ABNORMAL HIGH (ref 11.2–14.5)
WBC: 7.3 10*3/uL (ref 3.9–10.3)
lymph#: 2.7 10*3/uL (ref 0.9–3.3)

## 2015-12-30 NOTE — Telephone Encounter (Addendum)
Told Jacqueline Hammond that her [place] = 4.0 and her platelets =126k.today.  Her counts are fine to receive her chemotherapy tomorrow and take her decadron premeds as directed.   Ms. Rumler verbalized understanding. Chemistries are WNL for treatment tomorrow.

## 2015-12-31 ENCOUNTER — Other Ambulatory Visit: Payer: BLUE CROSS/BLUE SHIELD

## 2015-12-31 ENCOUNTER — Ambulatory Visit (HOSPITAL_BASED_OUTPATIENT_CLINIC_OR_DEPARTMENT_OTHER): Payer: BLUE CROSS/BLUE SHIELD

## 2015-12-31 VITALS — BP 129/74 | HR 100 | Temp 96.8°F | Resp 18

## 2015-12-31 DIAGNOSIS — Z5111 Encounter for antineoplastic chemotherapy: Secondary | ICD-10-CM

## 2015-12-31 DIAGNOSIS — C541 Malignant neoplasm of endometrium: Secondary | ICD-10-CM

## 2015-12-31 MED ORDER — SODIUM CHLORIDE 0.9 % IV SOLN
Freq: Once | INTRAVENOUS | Status: AC
  Administered 2015-12-31: 11:00:00 via INTRAVENOUS
  Filled 2015-12-31: qty 4

## 2015-12-31 MED ORDER — SODIUM CHLORIDE 0.9 % IV SOLN
Freq: Once | INTRAVENOUS | Status: AC
  Administered 2015-12-31: 12:00:00 via INTRAVENOUS
  Filled 2015-12-31: qty 5

## 2015-12-31 MED ORDER — SODIUM CHLORIDE 0.9 % IV SOLN
531.6000 mg | Freq: Once | INTRAVENOUS | Status: AC
  Administered 2015-12-31: 530 mg via INTRAVENOUS
  Filled 2015-12-31: qty 53

## 2015-12-31 MED ORDER — DIPHENHYDRAMINE HCL 50 MG/ML IJ SOLN
INTRAMUSCULAR | Status: AC
  Filled 2015-12-31: qty 1

## 2015-12-31 MED ORDER — FAMOTIDINE IN NACL 20-0.9 MG/50ML-% IV SOLN
INTRAVENOUS | Status: AC
  Filled 2015-12-31: qty 50

## 2015-12-31 MED ORDER — DIPHENHYDRAMINE HCL 50 MG/ML IJ SOLN
50.0000 mg | Freq: Once | INTRAMUSCULAR | Status: AC
  Administered 2015-12-31: 50 mg via INTRAVENOUS

## 2015-12-31 MED ORDER — SODIUM CHLORIDE 0.9 % IV SOLN
Freq: Once | INTRAVENOUS | Status: AC
  Administered 2015-12-31: 11:00:00 via INTRAVENOUS

## 2015-12-31 MED ORDER — FAMOTIDINE IN NACL 20-0.9 MG/50ML-% IV SOLN
20.0000 mg | Freq: Once | INTRAVENOUS | Status: AC
  Administered 2015-12-31: 20 mg via INTRAVENOUS

## 2015-12-31 MED ORDER — SODIUM CHLORIDE 0.9 % IV SOLN
175.0000 mg/m2 | Freq: Once | INTRAVENOUS | Status: AC
  Administered 2015-12-31: 294 mg via INTRAVENOUS
  Filled 2015-12-31: qty 49

## 2015-12-31 NOTE — Patient Instructions (Signed)
[facility] Discharge Instructions for Patients Receiving Chemotherapy  Today you received the following chemotherapy agents Taxol and Carboplatin.  To help prevent nausea and vomiting after your treatment, we encourage you to take your nausea medication as prescribed.   If you develop nausea and vomiting that is not controlled by your nausea medication, call the clinic.   BELOW ARE SYMPTOMS THAT SHOULD BE REPORTED IMMEDIATELY:  *FEVER GREATER THAN 100.5 F  *CHILLS WITH OR WITHOUT FEVER  NAUSEA AND VOMITING THAT IS NOT CONTROLLED WITH YOUR NAUSEA MEDICATION  *UNUSUAL SHORTNESS OF BREATH  *UNUSUAL BRUISING OR BLEEDING  TENDERNESS IN MOUTH AND THROAT WITH OR WITHOUT PRESENCE OF ULCERS  *URINARY PROBLEMS  *BOWEL PROBLEMS  UNUSUAL RASH Items with * indicate a potential emergency and should be followed up as soon as possible.  Feel free to call the clinic you have any questions or concerns. The clinic phone number is [phone].  Please show the CHEMO ALERT CARD at check-in to the Emergency Department and triage nurse.   

## 2016-01-05 ENCOUNTER — Other Ambulatory Visit: Payer: Self-pay | Admitting: Oncology

## 2016-01-05 ENCOUNTER — Ambulatory Visit
Admission: RE | Admit: 2016-01-05 | Discharge: 2016-01-05 | Disposition: A | Discharge status: Home / Self Care | Payer: BLUE CROSS/BLUE SHIELD | Source: Ambulatory Visit | Attending: Radiation Oncology | Admitting: Radiation Oncology

## 2016-01-05 ENCOUNTER — Telehealth: Payer: Self-pay | Admitting: Oncology

## 2016-01-05 ENCOUNTER — Telehealth: Payer: Self-pay

## 2016-01-05 VITALS — BP 132/93 | HR 95 | Temp 98.2°F | Ht 63.0 in | Wt 135.4 lb

## 2016-01-05 DIAGNOSIS — C541 Malignant neoplasm of endometrium: Secondary | ICD-10-CM

## 2016-01-05 DIAGNOSIS — Z510 Encounter for antineoplastic radiation therapy: Secondary | ICD-10-CM | POA: Diagnosis not present

## 2016-01-05 MED ORDER — SODIUM CHLORIDE 0.9 % IJ SOLN
10.0000 mL | Freq: Once | INTRAMUSCULAR | Status: AC
  Administered 2016-01-05: 10 mL via INTRAVENOUS

## 2016-01-05 NOTE — Progress Notes (Signed)
Does patient have an allergy to IV contrast dye?: No.   Has patient ever received premedication for IV contrast dye?: No.   Does patient take metformin?: No.  If patient does take metformin when was the last dose: n/a  Date of lab work: Dec 30, 2015 BUN: 9.8  CR: 0.9  IV site: antecubital right, condition patent and no redness  BP 132/93 mmHg  Pulse 95  Temp(Src) 98.2 F (36.8 C)  Ht 5\' 3"  (1.6 m)  Wt 135 lb 6.4 oz (61.417 kg)  BMI 23.99 kg/m2

## 2016-01-05 NOTE — Telephone Encounter (Signed)
Jacqueline Hammond called to inform Dr. Marko Hammond that Jacqueline Hammond had CT sim today and was stuck 4 times for her IV start..  Jacqueline Hammond stated that she was stuck 4 times for her last chemotherapy treatment. She would like a PAC placed prior to her next session of chemotherapy after radiation is completed. Will forward this information to Dr. Marko Hammond.

## 2016-01-05 NOTE — Telephone Encounter (Addendum)
Called and left a message for Dr. Mariana Kaufman nurse advising that patient would like to have a porta cath inserted for future chemotherapy due to having multiple IV sticks for her last infusion and CT SIM.

## 2016-01-05 NOTE — Progress Notes (Signed)
  Radiation Oncology         [phone] ________________________________  Name: Jacqueline Hammond MRN: [id number]  Date: 01/05/2016  DOB: 1951-06-12  SIMULATION AND TREATMENT PLANNING NOTE    ICD-9-CM ICD-10-CM   1. Endometrial cancer, FIGO stage IIIC ([facility]) 182.0 C54.1     DIAGNOSIS:  The encounter diagnosis was Endometrial cancer, FIGO stage IIIC ([place]).  NARRATIVE:  The patient was brought to the [facility].  Identity was confirmed.  All relevant records and images related to the planned course of therapy were reviewed.  The patient freely provided informed written consent to proceed with treatment after reviewing the details related to the planned course of therapy. The consent form was witnessed and verified by the simulation staff.  Then, the patient was set-up in a stable reproducible  supine position for radiation therapy.  CT images were obtained.  Surface markings were placed.  The CT images were loaded into the planning software.  Then the target and avoidance structures were contoured.  Treatment planning then occurred.  The radiation prescription was entered and confirmed.  Then, I designed and supervised the construction of a total of 1 medically necessary complex treatment devices.  I have requested : Intensity Modulated Radiotherapy (IMRT) is medically necessary for this case for the following reason:  Small bowel sparing..  I have ordered:CBC  PLAN:  The patient will receive 45 Gy in 25 fractions Followed by 3 intracavitary high-dose rate brachytherapy treatments directed at the vaginal cuff. The patient will be treated with iridium 192 for her brachytherapy treatments. External beam treatment will be with intensity modulated radiation therapy to limit dose to small bowel and bone marrow.  ________________________________  Blair Promise, PhD, MD    This document serves as a record of services personally performed by Gery Pray, MD. It was created on his  behalf by Lendon Collar, a trained medical scribe. The creation of this record is based on the scribe's personal observations and the provider's statements to them. This document has been checked and approved by the attending provider.

## 2016-01-06 ENCOUNTER — Ambulatory Visit: Payer: BLUE CROSS/BLUE SHIELD | Admitting: Radiation Oncology

## 2016-01-07 DIAGNOSIS — Z510 Encounter for antineoplastic radiation therapy: Secondary | ICD-10-CM | POA: Diagnosis not present

## 2016-01-10 ENCOUNTER — Other Ambulatory Visit: Payer: Self-pay | Admitting: Oncology

## 2016-01-14 ENCOUNTER — Ambulatory Visit (HOSPITAL_BASED_OUTPATIENT_CLINIC_OR_DEPARTMENT_OTHER): Payer: BLUE CROSS/BLUE SHIELD | Admitting: Oncology

## 2016-01-14 ENCOUNTER — Ambulatory Visit
Admission: RE | Admit: 2016-01-14 | Payer: BLUE CROSS/BLUE SHIELD | Source: Ambulatory Visit | Admitting: Radiation Oncology

## 2016-01-14 ENCOUNTER — Ambulatory Visit: Payer: BLUE CROSS/BLUE SHIELD

## 2016-01-14 ENCOUNTER — Other Ambulatory Visit (HOSPITAL_BASED_OUTPATIENT_CLINIC_OR_DEPARTMENT_OTHER): Payer: BLUE CROSS/BLUE SHIELD

## 2016-01-14 ENCOUNTER — Encounter: Payer: Self-pay | Admitting: Oncology

## 2016-01-14 ENCOUNTER — Ambulatory Visit
Admission: RE | Admit: 2016-01-14 | Discharge: 2016-01-14 | Disposition: A | Discharge status: Home / Self Care | Payer: BLUE CROSS/BLUE SHIELD | Source: Ambulatory Visit | Attending: Radiation Oncology | Admitting: Radiation Oncology

## 2016-01-14 ENCOUNTER — Telehealth: Payer: Self-pay | Admitting: Oncology

## 2016-01-14 VITALS — BP 154/88 | HR 97 | Temp 99.0°F | Resp 18 | Ht 63.0 in | Wt 135.9 lb

## 2016-01-14 DIAGNOSIS — T451X5A Adverse effect of antineoplastic and immunosuppressive drugs, initial encounter: Secondary | ICD-10-CM

## 2016-01-14 DIAGNOSIS — G2581 Restless legs syndrome: Secondary | ICD-10-CM

## 2016-01-14 DIAGNOSIS — C541 Malignant neoplasm of endometrium: Secondary | ICD-10-CM

## 2016-01-14 DIAGNOSIS — Z720 Tobacco use: Secondary | ICD-10-CM

## 2016-01-14 DIAGNOSIS — M8580 Other specified disorders of bone density and structure, unspecified site: Secondary | ICD-10-CM | POA: Diagnosis not present

## 2016-01-14 DIAGNOSIS — I878 Other specified disorders of veins: Secondary | ICD-10-CM

## 2016-01-14 DIAGNOSIS — D701 Agranulocytosis secondary to cancer chemotherapy: Secondary | ICD-10-CM

## 2016-01-14 LAB — CBC WITH DIFFERENTIAL/PLATELET
BASO%: 0.3 % (ref 0.0–2.0)
Basophils Absolute: 0 10*3/uL (ref 0.0–0.1)
EOS ABS: 0 10*3/uL (ref 0.0–0.5)
EOS%: 0.3 % (ref 0.0–7.0)
HCT: 33.3 % — ABNORMAL LOW (ref 34.8–46.6)
HGB: 11.3 g/dL — ABNORMAL LOW (ref 11.6–15.9)
LYMPH#: 2.4 10*3/uL (ref 0.9–3.3)
LYMPH%: 74.1 % — ABNORMAL HIGH (ref 14.0–49.7)
MCH: 35.2 pg — ABNORMAL HIGH (ref 25.1–34.0)
MCHC: 33.9 g/dL (ref 31.5–36.0)
MCV: 103.7 fL — ABNORMAL HIGH (ref 79.5–101.0)
MONO#: 0.7 10*3/uL (ref 0.1–0.9)
MONO%: 20.4 % — AB (ref 0.0–14.0)
NEUT%: 4.9 % — ABNORMAL LOW (ref 38.4–76.8)
NEUTROS ABS: 0.2 10*3/uL — AB (ref 1.5–6.5)
NRBC: 0 % (ref 0–0)
PLATELETS: 181 10*3/uL (ref 145–400)
RBC: 3.21 10*6/uL — AB (ref 3.70–5.45)
RDW: 16 % — AB (ref 11.2–14.5)
WBC: 3.2 10*3/uL — AB (ref 3.9–10.3)

## 2016-01-14 LAB — COMPREHENSIVE METABOLIC PANEL
ALT: 20 U/L (ref 0–55)
ANION GAP: 7 meq/L (ref 3–11)
AST: 16 U/L (ref 5–34)
Albumin: 3.7 g/dL (ref 3.5–5.0)
Alkaline Phosphatase: 85 U/L (ref 40–150)
BUN: 9.5 mg/dL (ref 7.0–26.0)
CO2: 29 meq/L (ref 22–29)
CREATININE: 0.9 mg/dL (ref 0.6–1.1)
Calcium: 9.7 mg/dL (ref 8.4–10.4)
Chloride: 105 mEq/L (ref 98–109)
EGFR: 64 mL/min/{1.73_m2} — ABNORMAL LOW (ref >90)
GLUCOSE: 101 mg/dL (ref 70–140)
Potassium: 3.8 mEq/L (ref 3.5–5.1)
Sodium: 141 mEq/L (ref 136–145)
TOTAL PROTEIN: 7 g/dL (ref 6.4–8.3)

## 2016-01-14 MED ORDER — TBO-FILGRASTIM 300 MCG/0.5ML ~~LOC~~ SOSY
300.0000 ug | PREFILLED_SYRINGE | Freq: Once | SUBCUTANEOUS | Status: AC
  Administered 2016-01-14: 300 ug via SUBCUTANEOUS
  Filled 2016-01-14: qty 0.5

## 2016-01-14 NOTE — Telephone Encounter (Signed)
appt made and avs printed °

## 2016-01-14 NOTE — Progress Notes (Signed)
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 She had cycle 3 carbo taxol on 12-31-15 and is neutropenic today with ANC 0.2  (day 15 cycle 3); granix started now and will be needed also with subsequent chemo cycles. She is not febrile. She was to start radiation today, however with this degree of neutropenia the radiation will be delayed until 01-18-16.   Patient had chemo side effects thru day 4, with "wobbly legs" and fatigue, also restless legs at night, these symptoms different from not too severe taxol aches. Gatorade helped the restless legs. She managed constipation better this cycle with laxatives. She used antiemetics day 1, but had no nausea. Peripheral lV access difficult (see below). No fever or symptoms of infection. No bleeding. No LE swelling. Bladder ok. No peripheral neuropathy.  Remainder of 10 point Review of Systems negative/ unchanged.   ONCOLOGIC HISTORY Patient presented to Dr Phineas Real as new patient 10-01-15 after noticing recent slight vaginal spotting. PAP had atypical glandular cells, then sonohysterogram and endometrial biopsy on 10-19-15. The uterus was 7.4 x 3.5 x 3.0 cm with endometrial stripe of 6.8 mm. Endometrial biopsy ([id number] from [facility]) showed endometrial carcinoma which appeared high grade with serous features. She was seen by Dr Josephina Shih on 10-23-15, exam not remarkable. She had CT CAP 10-28-15 with no evidence of metastatic disease, incidental atherosclerosis and ectatic abdominal aorta. She had surgery at [facility] by Dr Cindie Laroche on 10-29-15, which was robotic hysterectomy BSO with pelvic and paraaortic sentinel node evaluation. Intraoperative findings were of small uterus with normal tubes and ovaries. Adhesions of bladder  due to uterine fundus from prior surgery, bilateral pelvic sentinel pelvic mapping to left obturator space node and to right external iliac node, 3 right paraaortic nodes mapped as sentinel nodes, normal upper abdominal survey. Pathology [phone] had high grade serous adenocarcinoma of uterus involving cervical stroma and outer half of myometrium, isolated tumor cells in 2/2 right external iliac and 2/2 left obturator nodes, negative right and left periaortics, bilateral tubes and ovaries benign. Immunohistochemical stains confirmed serous carcinoma, ER and PR negative. [facility] labs 10-27-15 (preoperative) included CA 125 of 12.1, WBC 10.5, Hgb 14.5 and platelets 290k. [facility] gyn oncology multidisciplinary conference 11-04-15 recommended 6 cycles carboplatin taxol given in sandwich fashion with pelvic radiation and vaginal brachytherapy. First carboplatin taxol given 11-20-15, leukopenic with counts not at nadir day 11 cycle 1, ANC 1.2, given granix x 1. She did not have further gCSF until documented neutropenia day 15 cycle 3 with ANC 0.2.     Objective:  Vital signs in last 24 hours:  BP 154/88 mmHg  Pulse 97  Temp(Src) 99 F (37.2 C) (Oral)  Resp 18  Ht 5' 3"  (1.6 m)  Wt 135 lb 14.4 oz (61.644 kg)  BMI 24.08 kg/m2  SpO2 100%  Alert, oriented and appropriate. Ambulatory without difficulty. Looks comfortable, respirations not labored,  talkative and very pleasant Alopecia  HEENT:PERRL, sclerae not icteric. Oral mucosa moist without lesions, posterior pharynx clear.  Neck supple. No JVD.  Lymphatics:no cervical,supraclavicular or inguinal adenopathy Resp: somewhat diminished BS thruout otherwise clear to auscultation bilaterally and normal percussion bilaterally Cardio: regular rate and rhythm. No gallop. GI: soft, nontender, not distended, no mass or organomegaly. Normally active bowel sounds.  Surgical incisions not remarkable. Musculoskeletal/ Extremities: without pitting edema, cords,  tenderness Neuro: no peripheral neuropathy. Otherwise nonfocal. PSYCH appropriate mood and affect Skin without rash, ecchymosis, petechiae. No phlebitis at sites of attempted IVs   Lab Results:  Results for orders placed or performed in visit on 01/14/16  CBC with Differential  Result Value Ref Range   WBC 3.2 (L) 3.9 - 10.3 10e3/uL   NEUT# 0.2 (LL) 1.5 - 6.5 10e3/uL   HGB 11.3 (L) 11.6 - 15.9 g/dL   HCT 33.3 (L) 34.8 - 46.6 %   Platelets 181 145 - 400 10e3/uL   MCV 103.7 (H) 79.5 - 101.0 fL   MCH 35.2 (H) 25.1 - 34.0 pg   MCHC 33.9 31.5 - 36.0 g/dL   RBC 3.21 (L) 3.70 - 5.45 10e6/uL   RDW 16.0 (H) 11.2 - 14.5 %   lymph# 2.4 0.9 - 3.3 10e3/uL   MONO# 0.7 0.1 - 0.9 10e3/uL   Eosinophils Absolute 0.0 0.0 - 0.5 10e3/uL   Basophils Absolute 0.0 0.0 - 0.1 10e3/uL   NEUT% 4.9 (L) 38.4 - 76.8 %   LYMPH% 74.1 (H) 14.0 - 49.7 %   MONO% 20.4 (H) 0.0 - 14.0 %   EOS% 0.3 0.0 - 7.0 %   BASO% 0.3 0.0 - 2.0 %   nRBC 0 0 - 0 %  Comprehensive metabolic panel  Result Value Ref Range   Sodium 141 136 - 145 mEq/L   Potassium 3.8 3.5 - 5.1 mEq/L   Chloride 105 98 - 109 mEq/L   CO2 29 22 - 29 mEq/L   Glucose 101 70 - 140 mg/dl   BUN 9.5 7.0 - 26.0 mg/dL   Creatinine 0.9 0.6 - 1.1 mg/dL   Total Bilirubin <0.30 0.20 - 1.20 mg/dL   Alkaline Phosphatase 85 40 - 150 U/L   AST 16 5 - 34 U/L   ALT 20 0 - 55 U/L   Total Protein 7.0 6.4 - 8.3 g/dL   Albumin 3.7 3.5 - 5.0 g/dL   Calcium 9.7 8.4 - 10.4 mg/dL   Anion Gap 7 3 - 11 mEq/L   EGFR 64 (L) >90 ml/min/1.73 m2     Studies/Results:  No results found.  Medications: I have reviewed the patient's current medications. Granix now and may need also on 6-16 if [place] <=1.2.  She can use claritin also with granix.    DISCUSSION Neutropenia, neutropenic precautions and gCSF discussed. I spoke directly with Dr Clabe Seal RN, with message to him re neutropenia today and his preference to hold start of RT until 6-19. He would like repeat CBC prior to  start of RT, either 6-16 or 6-19. Will use granix after remaining chemo treatments.  Discussed options for IV access to complete last 3 cycles of chemo, after radiation. Discussed PAC vs PICC. If PAC, less maintenance, some less risk of infection, would likely leave in for at least 6 months after chemo completes. If PICC, could place just prior to cycle 4 and remove just after cycle 6, so in for ~ 6 weeks, dressing changes and flushes, infection risk. At completion of this discussion she likely will prefer PICC, will set up when I see her back near end of radiation to be done just prior to cycle 4 chemo.   Assessment/Plan:   1.IIIC1 serous high grade endometrial carcinoma with cervical stromal involvement and isolated tumor cells identified in bilateral sentinel pelvic nodes: robotic hysterectomy BSO pelvic and periaortic node evaluation at [facility] 10-29-15.  Plan chemotherapy + radiation in sandwich fashion. Will begin RT 6-19 thru ~ 7-20, then last 3 cycles of chemo. 2.Chemo neutropenia: ANC 0.2 today, tho other counts ok and should recover quickly now at day 15. Will give granix today and recheck counts on 6-16. Neutropenic precautions 3.long and ongoing tobacco abuse: she has cut back to 1-3 cigarettes/ 24 hrs. Smoking cessation encouraged. 4. Overdue mammogram, last at [place]. Four benign colon polyps at colonoscopy by Dr Henrene Pastor 06-2008: repeat per GI 6.osteopenia by bone density scan 09-2015 7.elevated lipids followed by PCP 8.hx BTL 9.radiology evidence of atherosclerosis thoracic and abdominal, and ectatic abdominal aorta. Recommendation to follow up with [place] in 5 years, cc this information to PCP 10. mild leukocytosis and macrocytosis without anemia at consultation visit 11-13-15: mildly elevated WBC thought post op with no symptoms of infection, macrocytosis slightly increased from a year ago, B12 and folate normal 09-2014, denies ETOH. Follow 11.Constipation around chemo, better managed  now. 12.restless legs several days after chemo, history of similar problem in years past. Gatorade helpful 13. Peripheral IV access more difficult, multiple attempts for cycle 3. Expect will use PICC for completion of chemo.    All questions answered, patient understands and agrees with recommendations and plans. Granix orders placed with parameters. Time spent 30 min including >50% counseling and coordination of care. Route PCP, cc Dr Christain [place], MD   01/14/2016, 5:11 PM

## 2016-01-14 NOTE — Progress Notes (Signed)
  Radiation Oncology         (336) [phone] ________________________________  Name: Jacqueline Hammond MRN: [id number]  Date: 01/14/2016  DOB: [birth year]  Simulation Verification Note    ICD-9-CM ICD-10-CM   1. Endometrial cancer, FIGO stage IIIC (HCC) 182.0 C54.1     Status: outpatient  NARRATIVE: The patient was brought to the treatment unit and placed in the planned treatment position. The clinical setup was verified. Then port films were obtained and uploaded to the radiation oncology medical record software.  The treatment beams were carefully compared against the planned radiation fields. The position location and shape of the radiation fields was reviewed. They targeted volume of tissue appears to be appropriately covered by the radiation beams. Organs at risk appear to be excluded as planned.  Based on my personal review, I approved the simulation verification. The patient's treatment will proceed as planned.  -----------------------------------  Blair Promise, PhD, MD

## 2016-01-14 NOTE — Telephone Encounter (Signed)
Dr. Marko Plume called regarding Jacqueline Hammond having an [place] of 0.2.  Per Dr. Sondra Come, hold radiation until Monday and also see if labs can be drawn before treatment on Monday.  Called Dr. Marko Plume back and advised her of having the patient start on Monday.  Dr. Marko Plume that she ill have labs ordered for tomorrow and then Monday if the [place] is still too low.  Called linac 4 and notified the therapists of the cancellation and new start date.

## 2016-01-15 ENCOUNTER — Ambulatory Visit: Admission: RE | Admit: 2016-01-15 | Payer: BLUE CROSS/BLUE SHIELD | Source: Ambulatory Visit

## 2016-01-15 ENCOUNTER — Other Ambulatory Visit (HOSPITAL_BASED_OUTPATIENT_CLINIC_OR_DEPARTMENT_OTHER): Payer: BLUE CROSS/BLUE SHIELD

## 2016-01-15 ENCOUNTER — Telehealth: Payer: Self-pay

## 2016-01-15 ENCOUNTER — Telehealth: Payer: Self-pay | Admitting: Oncology

## 2016-01-15 ENCOUNTER — Ambulatory Visit: Payer: BLUE CROSS/BLUE SHIELD

## 2016-01-15 DIAGNOSIS — C541 Malignant neoplasm of endometrium: Secondary | ICD-10-CM

## 2016-01-15 DIAGNOSIS — D701 Agranulocytosis secondary to cancer chemotherapy: Secondary | ICD-10-CM | POA: Insufficient documentation

## 2016-01-15 DIAGNOSIS — T451X5A Adverse effect of antineoplastic and immunosuppressive drugs, initial encounter: Secondary | ICD-10-CM

## 2016-01-15 DIAGNOSIS — I878 Other specified disorders of veins: Secondary | ICD-10-CM | POA: Insufficient documentation

## 2016-01-15 LAB — CBC WITH DIFFERENTIAL/PLATELET
BASO%: 0.2 % (ref 0.0–2.0)
BASOS ABS: 0 10*3/uL (ref 0.0–0.1)
EOS ABS: 0 10*3/uL (ref 0.0–0.5)
EOS%: 0.2 % (ref 0.0–7.0)
HCT: 34.2 % — ABNORMAL LOW (ref 34.8–46.6)
HGB: 11.5 g/dL — ABNORMAL LOW (ref 11.6–15.9)
LYMPH#: 2.4 10*3/uL (ref 0.9–3.3)
LYMPH%: 38.9 % (ref 14.0–49.7)
MCH: 35.6 pg — AB (ref 25.1–34.0)
MCHC: 33.6 g/dL (ref 31.5–36.0)
MCV: 105.7 fL — AB (ref 79.5–101.0)
MONO#: 1.2 10*3/uL — ABNORMAL HIGH (ref 0.1–0.9)
MONO%: 19.8 % — AB (ref 0.0–14.0)
NEUT#: 2.5 10*3/uL (ref 1.5–6.5)
NEUT%: 40.9 % (ref 38.4–76.8)
PLATELETS: 173 10*3/uL (ref 145–400)
RBC: 3.24 10*6/uL — AB (ref 3.70–5.45)
RDW: 16.7 % — AB (ref 11.2–14.5)
WBC: 6.1 10*3/uL (ref 3.9–10.3)

## 2016-01-15 NOTE — Telephone Encounter (Signed)
LM for Jacqueline Hammond stating that Jacqueline Hammond's ANC was up to 2.5 today.  No granix given today as ANC >1.2 per Jacqueline Hammond parameter for today's labs. Labs in patient's EMR.

## 2016-01-15 NOTE — Progress Notes (Signed)
ANC noted at 2.5, injection not given per parameters of Dr. Sondra Come. Lousie, RN, discussed results with patient.

## 2016-01-15 NOTE — Telephone Encounter (Addendum)
Barbaraann Share, RN called and said Analyn's ANC today was 2.5.  She was not given an additional dose of Granex per Dr. Mariana Kaufman parameters.  Will notify Dr. Sondra Come of [place] results.

## 2016-01-18 ENCOUNTER — Telehealth: Payer: Self-pay | Admitting: Oncology

## 2016-01-18 ENCOUNTER — Other Ambulatory Visit: Payer: Self-pay | Admitting: Oncology

## 2016-01-18 ENCOUNTER — Ambulatory Visit
Admission: RE | Admit: 2016-01-18 | Discharge: 2016-01-18 | Disposition: A | Discharge status: Home / Self Care | Payer: BLUE CROSS/BLUE SHIELD | Source: Ambulatory Visit | Attending: Radiation Oncology | Admitting: Radiation Oncology

## 2016-01-18 ENCOUNTER — Ambulatory Visit: Payer: BLUE CROSS/BLUE SHIELD

## 2016-01-18 NOTE — Telephone Encounter (Addendum)
Jacqueline Hammond again regarding her radiation appointment today.  Fey said she thought it was tomorrow at 12 pm so she went out of town and is not able to start today.  She said she had double checked with someone at the desk in the radiation waiting room and was told her start date was on Tuesday.  Verified with her that the appointment for tomorrow is 12 pm and that we will see her after the treatment. Notified Jehnna, RT on Linac 4.

## 2016-01-18 NOTE — Telephone Encounter (Signed)
Per Dr. Sondra Come, Jacqueline Hammond is ok to start radiation today with ANC of 2.5.  [name] and she is aware of her 12 pm appointment today to start radiation.

## 2016-01-19 ENCOUNTER — Ambulatory Visit
Admission: RE | Admit: 2016-01-19 | Discharge: 2016-01-19 | Disposition: A | Discharge status: Home / Self Care | Payer: BLUE CROSS/BLUE SHIELD | Source: Ambulatory Visit | Attending: Radiation Oncology | Admitting: Radiation Oncology

## 2016-01-19 ENCOUNTER — Ambulatory Visit: Payer: BLUE CROSS/BLUE SHIELD | Admitting: Radiation Oncology

## 2016-01-19 ENCOUNTER — Ambulatory Visit: Payer: BLUE CROSS/BLUE SHIELD

## 2016-01-19 ENCOUNTER — Encounter: Payer: Self-pay | Admitting: Radiation Oncology

## 2016-01-19 VITALS — BP 122/88 | HR 91 | Temp 97.8°F | Ht 63.0 in | Wt 136.4 lb

## 2016-01-19 DIAGNOSIS — C541 Malignant neoplasm of endometrium: Secondary | ICD-10-CM

## 2016-01-19 DIAGNOSIS — Z510 Encounter for antineoplastic radiation therapy: Secondary | ICD-10-CM | POA: Diagnosis not present

## 2016-01-19 NOTE — Progress Notes (Signed)
  Radiation Oncology         [phone] ________________________________  Name: Jacqueline Hammond MRN: [id number]  Date: 01/19/2016  DOB: May 05, 1951  Simulation Verification Note    ICD-9-CM ICD-10-CM   1. Endometrial cancer, FIGO stage IIIC (HCC) 182.0 C54.1     Status: outpatient  NARRATIVE: The patient was brought to the treatment unit and placed in the planned treatment position. The clinical setup was verified. Then port films were obtained and uploaded to the radiation oncology medical record software.  The treatment beams were carefully compared against the planned radiation fields. The position location and shape of the radiation fields was reviewed. They targeted volume of tissue appears to be appropriately covered by the radiation beams. Organs at risk appear to be excluded as planned.  Based on my personal review, I approved the simulation verification. The patient's treatment will proceed as planned.  -----------------------------------  Blair Promise, PhD, MD

## 2016-01-19 NOTE — Progress Notes (Signed)
Jacqueline Hammond has completed 1 fraction to her pelvis.  She denies having pain, bladder/bowel issues, vaginal/rectal bleeding or discharge.  She reports having fatigue "sometimes."  BP 122/88 mmHg  Pulse 91  Temp(Src) 97.8 F (36.6 C) (Oral)  Ht 5\' 3"  (1.6 m)  Wt 136 lb 6.4 oz (61.871 kg)  BMI 24.17 kg/m2  SpO2 100%   Wt Readings from Last 3 Encounters:  01/19/16 136 lb 6.4 oz (61.871 kg)  01/14/16 135 lb 14.4 oz (61.644 kg)  01/05/16 135 lb 6.4 oz (61.417 kg)

## 2016-01-19 NOTE — Progress Notes (Signed)
  Radiation Oncology         [phone] ________________________________  Name: Jacqueline Hammond MRN: [id number]  Date: 01/19/2016  DOB: 1951/01/18  Weekly Radiation Therapy Management     ICD-9-CM ICD-10-CM   1. Endometrial cancer, FIGO stage IIIC (HCC) 182.0 C54.1       Current Dose: 1.8 Gy     Planned Dose:  63 Gy  Narrative . . . . . . . . The patient presents for routine under treatment assessment.                                   The patient is without complaint.  Ferdie Ping has completed 1 fraction to her pelvis. She denies having pain, bladder/bowel issues, vaginal/rectal bleeding or discharge. She reports having fatigue "sometimes." Denies nausea.                                   Set-up films were reviewed.                                 The chart was checked. Physical Findings. . .  height is 5\' 3"  (1.6 m) and weight is 136 lb 6.4 oz (61.871 kg). Her oral temperature is 97.8 F (36.6 C). Her blood pressure is 122/88 and her pulse is 91. Her oxygen saturation is 100%. . Weight essentially stable.  No significant changes. The heart has a regular rate and rhythm. The lungs are clear to auscultation. Abdomen soft and non-tender.  Impression . . . . . . . The patient is tolerating radiation. Plan . . . . . . . . . . . . Continue treatment as planned.   ________________________________   Blair Promise, PhD, MD   This document serves as a record of services personally performed by Gery Pray, MD. It was created on his behalf by Derek Mound, a trained medical scribe. The creation of this record is based on the scribe's personal observations and the provider's statements to them. This document has been checked and approved by the attending provider.

## 2016-01-19 NOTE — Progress Notes (Signed)
Pt here for patient teaching.  Pt given Radiation and You booklet. Reviewed areas of pertinence such as diarrhea, fatigue, hair loss, nausea and vomiting, skin changes and urinary and bladder changes . Pt able to give teach back of to pat skin, use unscented/gentle soap, use baby wipes, have Imodium on hand and drink plenty of water,avoid applying anything to skin within 4 hours of treatment. Pt demonstrated understanding and verbalizes understanding of information given and will contact nursing with any questions or concerns.        

## 2016-01-20 ENCOUNTER — Ambulatory Visit
Admission: RE | Admit: 2016-01-20 | Discharge: 2016-01-20 | Disposition: A | Discharge status: Home / Self Care | Payer: BLUE CROSS/BLUE SHIELD | Source: Ambulatory Visit | Attending: Radiation Oncology | Admitting: Radiation Oncology

## 2016-01-20 DIAGNOSIS — Z510 Encounter for antineoplastic radiation therapy: Secondary | ICD-10-CM | POA: Diagnosis not present

## 2016-01-21 ENCOUNTER — Ambulatory Visit
Admission: RE | Admit: 2016-01-21 | Discharge: 2016-01-21 | Disposition: A | Discharge status: Home / Self Care | Payer: BLUE CROSS/BLUE SHIELD | Source: Ambulatory Visit | Attending: Radiation Oncology | Admitting: Radiation Oncology

## 2016-01-21 ENCOUNTER — Ambulatory Visit: Payer: BLUE CROSS/BLUE SHIELD

## 2016-01-21 ENCOUNTER — Other Ambulatory Visit: Payer: BLUE CROSS/BLUE SHIELD

## 2016-01-21 DIAGNOSIS — Z510 Encounter for antineoplastic radiation therapy: Secondary | ICD-10-CM | POA: Diagnosis not present

## 2016-01-22 ENCOUNTER — Ambulatory Visit
Admission: RE | Admit: 2016-01-22 | Discharge: 2016-01-22 | Disposition: A | Discharge status: Home / Self Care | Payer: BLUE CROSS/BLUE SHIELD | Source: Ambulatory Visit | Attending: Radiation Oncology | Admitting: Radiation Oncology

## 2016-01-22 DIAGNOSIS — Z510 Encounter for antineoplastic radiation therapy: Secondary | ICD-10-CM | POA: Diagnosis not present

## 2016-01-25 ENCOUNTER — Ambulatory Visit
Admission: RE | Admit: 2016-01-25 | Discharge: 2016-01-25 | Disposition: A | Discharge status: Home / Self Care | Payer: BLUE CROSS/BLUE SHIELD | Source: Ambulatory Visit | Attending: Radiation Oncology | Admitting: Radiation Oncology

## 2016-01-25 DIAGNOSIS — Z510 Encounter for antineoplastic radiation therapy: Secondary | ICD-10-CM | POA: Diagnosis not present

## 2016-01-26 ENCOUNTER — Ambulatory Visit
Admission: RE | Admit: 2016-01-26 | Discharge: 2016-01-26 | Disposition: A | Discharge status: Home / Self Care | Payer: BLUE CROSS/BLUE SHIELD | Source: Ambulatory Visit | Attending: Radiation Oncology | Admitting: Radiation Oncology

## 2016-01-26 ENCOUNTER — Encounter: Payer: Self-pay | Admitting: Radiation Oncology

## 2016-01-26 VITALS — BP 131/88 | HR 107 | Temp 97.7°F | Ht 63.0 in | Wt 136.6 lb

## 2016-01-26 DIAGNOSIS — C541 Malignant neoplasm of endometrium: Secondary | ICD-10-CM

## 2016-01-26 DIAGNOSIS — Z510 Encounter for antineoplastic radiation therapy: Secondary | ICD-10-CM | POA: Diagnosis not present

## 2016-01-26 NOTE — Progress Notes (Signed)
Jacqueline Hammond has completed 6 fractions to her pelvis.  She denies having pain, nausea, skin irritation, bladder/bowel issues, vaginal/rectal bleeding or discharge.  She reports having fatigue but reports her energy level is improving.  BP 131/88 mmHg  Pulse 107  Temp(Src) 97.7 F (36.5 C) (Oral)  Ht 5\' 3"  (1.6 m)  Wt 136 lb 9.6 oz (61.961 kg)  BMI 24.20 kg/m2  SpO2 100%   Wt Readings from Last 3 Encounters:  01/26/16 136 lb 9.6 oz (61.961 kg)  01/19/16 136 lb 6.4 oz (61.871 kg)  01/14/16 135 lb 14.4 oz (61.644 kg)

## 2016-01-26 NOTE — Progress Notes (Signed)
  Radiation Oncology         [phone] ________________________________  Name: Jacqueline Hammond MRN: [id number]  Date: 01/26/2016  DOB: May 04, 1951  Weekly Radiation Therapy Management     ICD-9-CM ICD-10-CM   1. Endometrial cancer, FIGO stage IIIC (HCC) 182.0 C54.1      Current Dose: 10.8 Gy     Planned Dose:  45+ Gy  Narrative . . . . . . . . The patient presents for routine under treatment assessment   Jacqueline Hammond has completed 6 fractions to her pelvis. She denies having pain, nausea, skin irritation, bladder/bowel issues, vaginal/rectal bleeding, or discharge. She reports having fatigue, but reports her energy level is improving.                                 Set-up films were reviewed.                                 The chart was checked. Physical Findings. . .  height is 5\' 3"  (1.6 m) and weight is 136 lb 9.6 oz (61.961 kg). Her oral temperature is 97.7 F (36.5 C). Her blood pressure is 131/88 and her pulse is 107. Her oxygen saturation is 100%. . Weight essentially stable.  No significant changes. The heart has a regular rate and rhythm. The lungs are clear to auscultation. Abdomen soft, non-tender, normoactive bowel sounds. Impression . . . . . . . The patient is tolerating radiation. Plan . . . . . . . . . . . . Continue treatment as planned.   ________________________________   Blair Promise, PhD, MD  This document serves as a record of services personally performed by Gery Pray, MD. It was created on his behalf by Darcus Austin, a trained medical scribe. The creation of this record is based on the scribe's personal observations and the provider's statements to them. This document has been checked and approved by the attending provider.

## 2016-01-27 ENCOUNTER — Ambulatory Visit
Admission: RE | Admit: 2016-01-27 | Discharge: 2016-01-27 | Disposition: A | Discharge status: Home / Self Care | Payer: BLUE CROSS/BLUE SHIELD | Source: Ambulatory Visit | Attending: Radiation Oncology | Admitting: Radiation Oncology

## 2016-01-27 DIAGNOSIS — Z510 Encounter for antineoplastic radiation therapy: Secondary | ICD-10-CM | POA: Diagnosis not present

## 2016-01-28 ENCOUNTER — Ambulatory Visit
Admission: RE | Admit: 2016-01-28 | Discharge: 2016-01-28 | Disposition: A | Discharge status: Home / Self Care | Payer: BLUE CROSS/BLUE SHIELD | Source: Ambulatory Visit | Attending: Radiation Oncology | Admitting: Radiation Oncology

## 2016-01-28 DIAGNOSIS — Z510 Encounter for antineoplastic radiation therapy: Secondary | ICD-10-CM | POA: Diagnosis not present

## 2016-01-29 ENCOUNTER — Ambulatory Visit
Admission: RE | Admit: 2016-01-29 | Discharge: 2016-01-29 | Disposition: A | Discharge status: Home / Self Care | Payer: BLUE CROSS/BLUE SHIELD | Source: Ambulatory Visit | Attending: Radiation Oncology | Admitting: Radiation Oncology

## 2016-01-29 DIAGNOSIS — Z510 Encounter for antineoplastic radiation therapy: Secondary | ICD-10-CM | POA: Diagnosis not present

## 2016-02-01 ENCOUNTER — Ambulatory Visit
Admission: RE | Admit: 2016-02-01 | Discharge: 2016-02-01 | Disposition: A | Discharge status: Home / Self Care | Payer: BLUE CROSS/BLUE SHIELD | Source: Ambulatory Visit | Attending: Radiation Oncology | Admitting: Radiation Oncology

## 2016-02-01 DIAGNOSIS — Z510 Encounter for antineoplastic radiation therapy: Secondary | ICD-10-CM | POA: Diagnosis not present

## 2016-02-02 ENCOUNTER — Encounter: Payer: BLUE CROSS/BLUE SHIELD | Admitting: Radiation Oncology

## 2016-02-02 ENCOUNTER — Ambulatory Visit: Payer: BLUE CROSS/BLUE SHIELD

## 2016-02-03 ENCOUNTER — Ambulatory Visit
Admission: RE | Admit: 2016-02-03 | Discharge: 2016-02-03 | Disposition: A | Discharge status: Home / Self Care | Payer: BLUE CROSS/BLUE SHIELD | Source: Ambulatory Visit | Attending: Radiation Oncology | Admitting: Radiation Oncology

## 2016-02-03 DIAGNOSIS — Z510 Encounter for antineoplastic radiation therapy: Secondary | ICD-10-CM | POA: Diagnosis not present

## 2016-02-04 ENCOUNTER — Ambulatory Visit
Admission: RE | Admit: 2016-02-04 | Discharge: 2016-02-04 | Disposition: A | Discharge status: Home / Self Care | Payer: BLUE CROSS/BLUE SHIELD | Source: Ambulatory Visit | Attending: Radiation Oncology | Admitting: Radiation Oncology

## 2016-02-04 ENCOUNTER — Ambulatory Visit: Payer: BLUE CROSS/BLUE SHIELD

## 2016-02-04 ENCOUNTER — Other Ambulatory Visit: Payer: Self-pay | Admitting: Oncology

## 2016-02-04 DIAGNOSIS — Z510 Encounter for antineoplastic radiation therapy: Secondary | ICD-10-CM | POA: Diagnosis not present

## 2016-02-05 ENCOUNTER — Other Ambulatory Visit: Payer: Self-pay | Admitting: Oncology

## 2016-02-05 ENCOUNTER — Ambulatory Visit
Admission: RE | Admit: 2016-02-05 | Discharge: 2016-02-05 | Disposition: A | Discharge status: Home / Self Care | Payer: BLUE CROSS/BLUE SHIELD | Source: Ambulatory Visit | Attending: Radiation Oncology | Admitting: Radiation Oncology

## 2016-02-05 DIAGNOSIS — Z510 Encounter for antineoplastic radiation therapy: Secondary | ICD-10-CM | POA: Diagnosis not present

## 2016-02-05 DIAGNOSIS — C541 Malignant neoplasm of endometrium: Secondary | ICD-10-CM

## 2016-02-05 MED ORDER — TEMAZEPAM 15 MG PO CAPS: 15.0000 mg | ORAL_CAPSULE | Freq: Every evening | ORAL | Status: DC | PRN

## 2016-02-08 ENCOUNTER — Ambulatory Visit
Admission: RE | Admit: 2016-02-08 | Discharge: 2016-02-08 | Disposition: A | Discharge status: Home / Self Care | Payer: BLUE CROSS/BLUE SHIELD | Source: Ambulatory Visit | Attending: Radiation Oncology | Admitting: Radiation Oncology

## 2016-02-08 DIAGNOSIS — Z510 Encounter for antineoplastic radiation therapy: Secondary | ICD-10-CM | POA: Diagnosis not present

## 2016-02-09 ENCOUNTER — Ambulatory Visit
Admission: RE | Admit: 2016-02-09 | Discharge: 2016-02-09 | Disposition: A | Discharge status: Home / Self Care | Payer: BLUE CROSS/BLUE SHIELD | Source: Ambulatory Visit | Attending: Radiation Oncology | Admitting: Radiation Oncology

## 2016-02-09 ENCOUNTER — Encounter: Payer: Self-pay | Admitting: Radiation Oncology

## 2016-02-09 VITALS — BP 116/74 | HR 89 | Temp 97.7°F | Ht 63.0 in | Wt 132.6 lb

## 2016-02-09 DIAGNOSIS — C541 Malignant neoplasm of endometrium: Secondary | ICD-10-CM

## 2016-02-09 DIAGNOSIS — Z510 Encounter for antineoplastic radiation therapy: Secondary | ICD-10-CM | POA: Diagnosis not present

## 2016-02-09 NOTE — Progress Notes (Signed)
  Radiation Oncology         (336) [phone] ________________________________  Name: Jacqueline Hammond MRN: [id number]  Date: 02/09/2016  DOB: 05-04-1951    Weekly Radiation Therapy Management    ICD-9-CM ICD-10-CM   1. Endometrial cancer, FIGO stage IIIC (HCC) 182.0 C54.1      Current Dose: 27 Gy     Planned Dose:  45 Gy  Narrative . . . . . . . . The patient presents for routine under treatment assessment.                                   The patient reports intermittent episodes of diarrhea which started after her 9th. She denies any pain at this time. She reports fatigue. She is doing well and feels good other than having no energy.                                 Set-up films were reviewed.                                 The chart was checked. Physical Findings. . .  height is 5\' 3"  (1.6 m) and weight is 132 lb 9.6 oz (60.147 kg). Her temperature is 97.7 F (36.5 C). Her blood pressure is 116/74 and her pulse is 89. . Weight essentially stable.  No significant changes. Lungs are clear to auscultation bilaterally. Heart has regular rate and rhythm. No palpable cervical, supraclavicular, or axillary adenopathy. Abdomen soft, non-tender, normal bowel sounds. Impression . . . . . . . The patient is tolerating radiation. Plan . . . . . . . . . . . . Continue treatment as planned. I spoke with the patient about the [place] brachytherapy procedure.  ________________________________   Blair Promise, PhD, MD   This document serves as a record of services personally performed by Gery Pray, MD. It was created on his behalf by Arlyce Harman, a trained medical scribe. The creation of this record is based on the scribe's personal observations and the provider's statements to them. This document has been checked and approved by the attending provider.

## 2016-02-09 NOTE — Progress Notes (Addendum)
Ms. Rosenbloom is reporting intermittent episodes for diarrhea which started after her 9th and is managing with Imodium.  She denies any pain at this time.  She reports fatigue. Accompanied by her spouse.  Note 4 lb weight loss since 01/26/16  BP 116/74 mmHg  Pulse 89  Temp(Src) 97.7 F (36.5 C)  Ht 5\' 3"  (1.6 m)  Wt 132 lb 9.6 oz (60.147 kg)  BMI 23.49 kg/m2   Wt Readings from Last 3 Encounters:  02/09/16 132 lb 9.6 oz (60.147 kg)  01/26/16 136 lb 9.6 oz (61.961 kg)  01/19/16 136 lb 6.4 oz (61.871 kg)

## 2016-02-10 ENCOUNTER — Ambulatory Visit
Admission: RE | Admit: 2016-02-10 | Discharge: 2016-02-10 | Disposition: A | Discharge status: Home / Self Care | Payer: BLUE CROSS/BLUE SHIELD | Source: Ambulatory Visit | Attending: Radiation Oncology | Admitting: Radiation Oncology

## 2016-02-10 DIAGNOSIS — Z510 Encounter for antineoplastic radiation therapy: Secondary | ICD-10-CM | POA: Diagnosis not present

## 2016-02-11 ENCOUNTER — Other Ambulatory Visit: Payer: BLUE CROSS/BLUE SHIELD

## 2016-02-11 ENCOUNTER — Ambulatory Visit: Payer: BLUE CROSS/BLUE SHIELD

## 2016-02-11 ENCOUNTER — Ambulatory Visit
Admission: RE | Admit: 2016-02-11 | Discharge: 2016-02-11 | Disposition: A | Discharge status: Home / Self Care | Payer: BLUE CROSS/BLUE SHIELD | Source: Ambulatory Visit | Attending: Radiation Oncology | Admitting: Radiation Oncology

## 2016-02-11 DIAGNOSIS — Z510 Encounter for antineoplastic radiation therapy: Secondary | ICD-10-CM | POA: Diagnosis not present

## 2016-02-12 ENCOUNTER — Ambulatory Visit
Admission: RE | Admit: 2016-02-12 | Discharge: 2016-02-12 | Disposition: A | Discharge status: Home / Self Care | Payer: BLUE CROSS/BLUE SHIELD | Source: Ambulatory Visit | Attending: Radiation Oncology | Admitting: Radiation Oncology

## 2016-02-12 DIAGNOSIS — Z510 Encounter for antineoplastic radiation therapy: Secondary | ICD-10-CM | POA: Diagnosis not present

## 2016-02-14 ENCOUNTER — Other Ambulatory Visit: Payer: Self-pay | Admitting: Oncology

## 2016-02-15 ENCOUNTER — Ambulatory Visit
Admission: RE | Admit: 2016-02-15 | Discharge: 2016-02-15 | Disposition: A | Discharge status: Home / Self Care | Payer: BLUE CROSS/BLUE SHIELD | Source: Ambulatory Visit | Attending: Radiation Oncology | Admitting: Radiation Oncology

## 2016-02-15 ENCOUNTER — Ambulatory Visit (HOSPITAL_BASED_OUTPATIENT_CLINIC_OR_DEPARTMENT_OTHER): Payer: BLUE CROSS/BLUE SHIELD | Admitting: Oncology

## 2016-02-15 ENCOUNTER — Encounter: Payer: Self-pay | Admitting: Oncology

## 2016-02-15 ENCOUNTER — Other Ambulatory Visit (HOSPITAL_BASED_OUTPATIENT_CLINIC_OR_DEPARTMENT_OTHER): Payer: BLUE CROSS/BLUE SHIELD

## 2016-02-15 VITALS — BP 129/73 | HR 88 | Temp 98.2°F | Resp 18 | Ht 63.0 in | Wt 130.3 lb

## 2016-02-15 DIAGNOSIS — Z720 Tobacco use: Secondary | ICD-10-CM

## 2016-02-15 DIAGNOSIS — D701 Agranulocytosis secondary to cancer chemotherapy: Secondary | ICD-10-CM | POA: Diagnosis not present

## 2016-02-15 DIAGNOSIS — C541 Malignant neoplasm of endometrium: Secondary | ICD-10-CM | POA: Diagnosis not present

## 2016-02-15 DIAGNOSIS — T451X5A Adverse effect of antineoplastic and immunosuppressive drugs, initial encounter: Secondary | ICD-10-CM

## 2016-02-15 DIAGNOSIS — M8580 Other specified disorders of bone density and structure, unspecified site: Secondary | ICD-10-CM

## 2016-02-15 DIAGNOSIS — K5900 Constipation, unspecified: Secondary | ICD-10-CM

## 2016-02-15 DIAGNOSIS — I878 Other specified disorders of veins: Secondary | ICD-10-CM

## 2016-02-15 DIAGNOSIS — Z510 Encounter for antineoplastic radiation therapy: Secondary | ICD-10-CM | POA: Diagnosis not present

## 2016-02-15 DIAGNOSIS — R197 Diarrhea, unspecified: Secondary | ICD-10-CM

## 2016-02-15 LAB — COMPREHENSIVE METABOLIC PANEL
ALBUMIN: 3.9 g/dL (ref 3.5–5.0)
ALK PHOS: 81 U/L (ref 40–150)
ALT: 12 U/L (ref 0–55)
AST: 17 U/L (ref 5–34)
Anion Gap: 10 mEq/L (ref 3–11)
BUN: 9.9 mg/dL (ref 7.0–26.0)
CALCIUM: 9.8 mg/dL (ref 8.4–10.4)
CO2: 26 mEq/L (ref 22–29)
Chloride: 104 mEq/L (ref 98–109)
Creatinine: 0.9 mg/dL (ref 0.6–1.1)
EGFR: 71 mL/min/{1.73_m2} — AB (ref >90)
Glucose: 86 mg/dl (ref 70–140)
POTASSIUM: 3.5 meq/L (ref 3.5–5.1)
Sodium: 140 mEq/L (ref 136–145)
Total Bilirubin: 0.49 mg/dL (ref 0.20–1.20)
Total Protein: 7 g/dL (ref 6.4–8.3)

## 2016-02-15 LAB — CBC WITH DIFFERENTIAL/PLATELET
BASO%: 0.5 % (ref 0.0–2.0)
BASOS ABS: 0 10*3/uL (ref 0.0–0.1)
EOS ABS: 0.1 10*3/uL (ref 0.0–0.5)
EOS%: 2.4 % (ref 0.0–7.0)
HEMATOCRIT: 35.5 % (ref 34.8–46.6)
HEMOGLOBIN: 12.2 g/dL (ref 11.6–15.9)
LYMPH%: 15.6 % (ref 14.0–49.7)
MCH: 38 pg — AB (ref 25.1–34.0)
MCHC: 34.4 g/dL (ref 31.5–36.0)
MCV: 110.5 fL — AB (ref 79.5–101.0)
MONO#: 0.6 10*3/uL (ref 0.1–0.9)
MONO%: 9.9 % (ref 0.0–14.0)
NEUT#: 4.4 10*3/uL (ref 1.5–6.5)
NEUT%: 71.6 % (ref 38.4–76.8)
Platelets: 343 10*3/uL (ref 145–400)
RBC: 3.22 10*6/uL — ABNORMAL LOW (ref 3.70–5.45)
RDW: 19.7 % — AB (ref 11.2–14.5)
WBC: 6.2 10*3/uL (ref 3.9–10.3)
lymph#: 1 10*3/uL (ref 0.9–3.3)

## 2016-02-15 MED ORDER — DEXAMETHASONE 4 MG PO TABS: ORAL_TABLET | ORAL | Status: DC

## 2016-02-15 NOTE — Progress Notes (Signed)
OFFICE PROGRESS NOTE   February 15, 2016   Physicians: Terrence Dupont Rossi/ Cindie Laroche, Marin Olp, MD (PCP, Abbie Sons, Scarlette Shorts, Gery Pray   INTERVAL HISTORY:  Patient is seen, together with husband, in continuing attention to IIIC1 high grade carcinoma of endometrium, having had initial 3 cycles of adjuvant chemotherapy and now receiving pelvic IMRT thru 02-23-16.  She needed gCSF support with chemotherapy and will need PICC to complete final 3 planned cycles. She will have vaginal brachytherapy, which can be done concomitantly with chemo.   Patient is tolerating radiation well other than some intermittent diarrhea which is managed with 1-2 imodium / 24 hours. She denies skin irritation or bladder symptoms, no bleeding, no abdominal or pelvic discomfort. Appetite is improved, tho she has lost weight thru treatment thus far. No LE swelling. No fever or symptoms of infection. She is not markedly fatigued.  Remainder of 10 point Review of Systems negative.  PICC planned prior to resuming chemo No genetics testing.   ONCOLOGIC HISTORY Patient presented to Dr Phineas Real as new patient 10-01-15 after noticing recent slight vaginal spotting. PAP had atypical glandular cells, then sonohysterogram and endometrial biopsy on 10-19-15. The uterus was 7.4 x 3.5 x 3.0 cm with endometrial stripe of 6.8 mm. Endometrial biopsy ([id number] from [facility]) showed endometrial carcinoma which appeared high grade with serous features. She was seen by Dr Josephina Shih on 10-23-15, exam not remarkable. She had CT CAP 10-28-15 with no evidence of metastatic disease, incidental atherosclerosis and ectatic abdominal aorta. She had surgery at [facility] by Dr Cindie Laroche on 10-29-15, which was robotic hysterectomy BSO with pelvic and paraaortic sentinel node evaluation. Intraoperative findings were of small uterus with normal tubes and ovaries. Adhesions of bladder due to uterine fundus from prior surgery, bilateral pelvic  sentinel pelvic mapping to left obturator space node and to right external iliac node, 3 right paraaortic nodes mapped as sentinel nodes, normal upper abdominal survey. Pathology [phone] had high grade serous adenocarcinoma of uterus involving cervical stroma and outer half of myometrium, isolated tumor cells in 2/2 right external iliac and 2/2 left obturator nodes, negative right and left periaortics, bilateral tubes and ovaries benign. Immunohistochemical stains confirmed serous carcinoma, ER and PR negative. [facility] labs 10-27-15 (preoperative) included CA 125 of 12.1, WBC 10.5, Hgb 14.5 and platelets 290k. [facility] gyn oncology multidisciplinary conference 11-04-15 recommended 6 cycles carboplatin taxol given in sandwich fashion with pelvic radiation and vaginal brachytherapy. First carboplatin taxol given 11-20-15, leukopenic with counts not at nadir day 11 cycle 1, ANC 1.2, given granix x 1. She did not have further gCSF until documented neutropenia day 15 cycle 3 with ANC 0.2.   Objective:  Vital signs in last 24 hours:  BP 129/73 mmHg  Pulse 88  Temp(Src) 98.2 F (36.8 C) (Oral)  Resp 18  Ht _0  (1.6 m)  Wt 130 lb 4.8 oz (59.104 kg)  BMI 23.09 kg/m2  SpO2 100% Weight down 2 lbs Alert, oriented and appropriate. Ambulatory without difficulty. Respirations not labored RA. Alopecia. Looks comfortable, very pleasant as always.   HEENT:PERRL, sclerae not icteric. Oral mucosa moist without lesions, posterior pharynx clear.  Neck supple. No JVD.  Lymphatics:no cervical,supraclavicular or inguinal adenopathy Resp: clear to auscultation bilaterally and normal percussion bilaterally Cardio: regular rate and rhythm. No gallop. GI: soft, nontender, not distended, no mass or organomegaly. Normally active bowel sounds. Surgical incision not remarkable. Musculoskeletal/ Extremities: without pitting edema, cords, tenderness Neuro: no peripheral neuropathy. Otherwise nonfocal. PSYCH appropriate  mood and  affect Skin without rash, ecchymosis, petechiae   Lab Results:  Results for orders placed or performed in visit on 02/15/16  CBC with Differential  Result Value Ref Range   WBC 6.2 3.9 - 10.3 10e3/uL   NEUT# 4.4 1.5 - 6.5 10e3/uL   HGB 12.2 11.6 - 15.9 g/dL   HCT 35.5 34.8 - 46.6 %   Platelets 343 145 - 400 10e3/uL   MCV 110.5 (H) 79.5 - 101.0 fL   MCH 38.0 (H) 25.1 - 34.0 pg   MCHC 34.4 31.5 - 36.0 g/dL   RBC 3.22 (L) 3.70 - 5.45 10e6/uL   RDW 19.7 (H) 11.2 - 14.5 %   lymph# 1.0 0.9 - 3.3 10e3/uL   MONO# 0.6 0.1 - 0.9 10e3/uL   Eosinophils Absolute 0.1 0.0 - 0.5 10e3/uL   Basophils Absolute 0.0 0.0 - 0.1 10e3/uL   NEUT% 71.6 38.4 - 76.8 %   LYMPH% 15.6 14.0 - 49.7 %   MONO% 9.9 0.0 - 14.0 %   EOS% 2.4 0.0 - 7.0 %   BASO% 0.5 0.0 - 2.0 %  Comprehensive metabolic panel  Result Value Ref Range   Sodium 140 136 - 145 mEq/L   Potassium 3.5 3.5 - 5.1 mEq/L   Chloride 104 98 - 109 mEq/L   CO2 26 22 - 29 mEq/L   Glucose 86 70 - 140 mg/dl   BUN 9.9 7.0 - 26.0 mg/dL   Creatinine 0.9 0.6 - 1.1 mg/dL   Total Bilirubin 0.49 0.20 - 1.20 mg/dL   Alkaline Phosphatase 81 40 - 150 U/L   AST 17 5 - 34 U/L   ALT 12 0 - 55 U/L   Total Protein 7.0 6.4 - 8.3 g/dL   Albumin 3.9 3.5 - 5.0 g/dL   Calcium 9.8 8.4 - 10.4 mg/dL   Anion Gap 10 3 - 11 mEq/L   EGFR 71 (L) >90 ml/min/1.73 m2     Studies/Results:  No results found.  Medications: I have reviewed the patient's current medications. Premed decadron refilled for taxol DISCUSSION Needs to push po fluids including gatorade with radiation diarrhea, discussed increasing imodium if needed and sitz baths.  Discussed HDR concomitant with last cycles of chemo.  Discussed PICC, including placement shortly prior to resuming chemo and removal as soon as no longer needed following cycle 6, with flushes 2x weekly and avoiding getting line wet at home.   She would like to resume treatment ~ 03-03-16, tho will let us know a few days prior if  more symptomatic from radiation/ if needs to delay that start for another week or so. Will request PICC by IR day prior to resuming chemo.    Assessment/Plan:  1.IIIC1 serous high grade endometrial carcinoma with cervical stromal involvement and isolated tumor cells identified in bilateral sentinel pelvic nodes: robotic hysterectomy BSO pelvic and periaortic node evaluation at [facility] 10-29-15, adjuvant therapy continuing with chemotherapy + radiation in sandwich fashion. Pelvic IMRT planned thru 02-23-16 , then last 3 cycles of chemo with vaginal brachytherapy. Will give cycle 4 carbo taxol on 03-03-16 if stable, with granix 8-4 and 8-5. 2.Chemo neutropenia: will need gCSF with further carbo taxol 3.long and ongoing tobacco abuse: she has cut back to 1-3 cigarettes/ 24 hrs. Smoking cessation encouraged. 4. Overdue mammogram, last at [place]. Four benign colon polyps at colonoscopy by Dr Henrene Pastor 06-2008: repeat per GI 6.osteopenia by bone density scan 09-2015 7.elevated lipids followed by PCP 8.hx BTL 9.radiology evidence of  atherosclerosis thoracic and abdominal, and ectatic abdominal aorta. Recommendation to follow up with [place] in 5 years, cc this information to PCP 10. mild leukocytosis and macrocytosis without anemia at consultation visit 11-13-15: mildly elevated WBC thought post op with no symptoms of infection, macrocytosis slightly increased from a year ago, B12 and folate normal 09-2014, denies ETOH. Follow 11.Constipation around chemo, now some radiation diarrhea which she is managing adequately. Needs to increase po fluids. 12.restless legs several days after chemo, history of similar problem in years past. Gatorade helpful 13. Peripheral IV access more difficult, multiple attempts for cycle 3. Needs PICC for completion of chemo.    All questions answered. Chemo and granix orders placed. PICC orders placed. Time spent 25 min including >50% counseling and coordination of care. Route PCP, cc  Dr Sondra Come   Evlyn Clines, MD   02/15/2016, 3:51 PM

## 2016-02-16 ENCOUNTER — Ambulatory Visit
Admission: RE | Admit: 2016-02-16 | Discharge: 2016-02-16 | Disposition: A | Discharge status: Home / Self Care | Payer: BLUE CROSS/BLUE SHIELD | Source: Ambulatory Visit | Attending: Radiation Oncology | Admitting: Radiation Oncology

## 2016-02-16 ENCOUNTER — Encounter: Payer: Self-pay | Admitting: Radiation Oncology

## 2016-02-16 VITALS — BP 111/82 | HR 91 | Temp 98.0°F | Ht 63.0 in | Wt 131.9 lb

## 2016-02-16 DIAGNOSIS — C541 Malignant neoplasm of endometrium: Secondary | ICD-10-CM

## 2016-02-16 DIAGNOSIS — Z510 Encounter for antineoplastic radiation therapy: Secondary | ICD-10-CM | POA: Diagnosis not present

## 2016-02-16 NOTE — Progress Notes (Signed)
  Radiation Oncology         [phone] ________________________________  Name: Jacqueline Hammond MRN: [id number]  Date: 02/16/2016  DOB: 1950/11/26    Weekly Radiation Therapy Management  No diagnosis found.     ICD-9-CM  ICD-10-CM    1.  Endometrial cancer, FIGO stage IIIC (HCC)  182.0  C54.1      Current Dose: 36 Gy     Planned Dose:  45+ Gy  Narrative . . . . . . . . The patient presents for routine under treatment assessment.                                   Ferdie Ping has completed 20 fractions to her pelvis. She denies having pain. She reports having 1-2 loose stools per day and takes imodium once before radiation. She denies having any bladder issues or vaginal rectal bleeding/discharge. She reports her energy level is sluggish. She denies having any skin irritation. She denies nausea.                                  Set-up films were reviewed.                                 The chart was checked. Physical Findings. . .  height is 5\' 3"  (1.6 m) and weight is 131 lb 14.4 oz (59.829 kg). Her oral temperature is 98 F (36.7 C). Her blood pressure is 111/82 and her pulse is 91. Her oxygen saturation is 100%. . Weight essentially stable.  No significant changes. The lungs are clear to auscultation. The heart has a regular rhythm and rate. The abdomen is soft and nontender with normal bowel sounds.  Impression . . . . . . . The patient is tolerating radiation. Plan . . . . . . . . . . . . Continue treatment as planned. She has a vacation planned 02/23/2016-03/02/2016 before she starts brachytherapy.   ________________________________   Blair Promise, PhD, MD    This document serves as a record of services personally performed by Gery Pray, MD. It was created on his behalf by Lendon Collar, a trained medical scribe. The creation of this record is based on the scribe's personal observations and the provider's statements to them. This document has been checked and approved by  the attending provider.

## 2016-02-16 NOTE — Progress Notes (Signed)
Jacqueline Hammond has completed 20 fractions to her pelvis.  She denies having pain.  She reports having 1-2 loose stools per day and takes imodium once before radiation.  She denies having any bladder issues or vaginal rectal bleeding/discharge.  She reports her energy level is sluggish.  She denies having any skin irritation.  BP 111/82 mmHg  Pulse 91  Temp(Src) 98 F (36.7 C) (Oral)  Ht 5\' 3"  (1.6 m)  Wt 131 lb 14.4 oz (59.829 kg)  BMI 23.37 kg/m2  SpO2 100%  Wt Readings from Last 3 Encounters:  02/16/16 131 lb 14.4 oz (59.829 kg)  02/15/16 130 lb 4.8 oz (59.104 kg)  02/09/16 132 lb 9.6 oz (60.147 kg)

## 2016-02-17 ENCOUNTER — Ambulatory Visit
Admission: RE | Admit: 2016-02-17 | Discharge: 2016-02-17 | Disposition: A | Discharge status: Home / Self Care | Payer: BLUE CROSS/BLUE SHIELD | Source: Ambulatory Visit | Attending: Radiation Oncology | Admitting: Radiation Oncology

## 2016-02-17 DIAGNOSIS — Z510 Encounter for antineoplastic radiation therapy: Secondary | ICD-10-CM | POA: Diagnosis not present

## 2016-02-18 ENCOUNTER — Ambulatory Visit: Payer: BLUE CROSS/BLUE SHIELD

## 2016-02-18 ENCOUNTER — Ambulatory Visit
Admission: RE | Admit: 2016-02-18 | Discharge: 2016-02-18 | Disposition: A | Discharge status: Home / Self Care | Payer: BLUE CROSS/BLUE SHIELD | Source: Ambulatory Visit | Attending: Radiation Oncology | Admitting: Radiation Oncology

## 2016-02-18 DIAGNOSIS — Z510 Encounter for antineoplastic radiation therapy: Secondary | ICD-10-CM | POA: Diagnosis not present

## 2016-02-19 ENCOUNTER — Ambulatory Visit
Admission: RE | Admit: 2016-02-19 | Discharge: 2016-02-19 | Disposition: A | Discharge status: Home / Self Care | Payer: BLUE CROSS/BLUE SHIELD | Source: Ambulatory Visit | Attending: Radiation Oncology | Admitting: Radiation Oncology

## 2016-02-19 DIAGNOSIS — Z510 Encounter for antineoplastic radiation therapy: Secondary | ICD-10-CM | POA: Diagnosis not present

## 2016-02-22 ENCOUNTER — Ambulatory Visit: Payer: BLUE CROSS/BLUE SHIELD

## 2016-02-22 ENCOUNTER — Ambulatory Visit
Admission: RE | Admit: 2016-02-22 | Discharge: 2016-02-22 | Disposition: A | Discharge status: Home / Self Care | Payer: BLUE CROSS/BLUE SHIELD | Source: Ambulatory Visit | Attending: Radiation Oncology | Admitting: Radiation Oncology

## 2016-02-22 DIAGNOSIS — Z510 Encounter for antineoplastic radiation therapy: Secondary | ICD-10-CM | POA: Diagnosis not present

## 2016-02-23 ENCOUNTER — Ambulatory Visit: Payer: BLUE CROSS/BLUE SHIELD

## 2016-02-23 ENCOUNTER — Ambulatory Visit
Admission: RE | Admit: 2016-02-23 | Discharge: 2016-02-23 | Disposition: A | Discharge status: Home / Self Care | Payer: BLUE CROSS/BLUE SHIELD | Source: Ambulatory Visit | Attending: Radiation Oncology | Admitting: Radiation Oncology

## 2016-02-23 ENCOUNTER — Encounter: Payer: Self-pay | Admitting: Radiation Oncology

## 2016-02-23 VITALS — BP 129/86 | HR 80 | Temp 97.9°F | Ht 63.0 in | Wt 131.2 lb

## 2016-02-23 DIAGNOSIS — Z510 Encounter for antineoplastic radiation therapy: Secondary | ICD-10-CM | POA: Diagnosis not present

## 2016-02-23 DIAGNOSIS — C541 Malignant neoplasm of endometrium: Secondary | ICD-10-CM

## 2016-02-23 DIAGNOSIS — C7982 Secondary malignant neoplasm of genital organs: Secondary | ICD-10-CM

## 2016-02-23 NOTE — Progress Notes (Signed)
  Radiation Oncology         [phone]) [phone] ________________________________  Name: Jacqueline Hammond MRN: [id number]  Date: 02/23/2016  DOB: 13-Feb-1951    Weekly Radiation Therapy Management    ICD-9-CM ICD-10-CM   1. Endometrial cancer, FIGO stage IIIC (HCC) 182.0 C54.1   2. Secondary malignant neoplasm of cervix ([place]) 198.82 C79.82        ICD-9-CM  ICD-10-CM    1.  Endometrial cancer, FIGO stage IIIC (HCC)  182.0  C54.1      Current Dose: 45 Gy     Planned Dose:  45+ Gy  Narrative . . . . . . . . The patient presents for routine under treatment assessment.                                 Ferdie Ping has completed treatment to her pelvis with 25 fractions.  She reports having dysuria on and off that started last Thursday.  She continues to have diarrhea 2 times per day.  She is taking 2 Immodium per day.  She reports having fatigue.  She denies hematuria, vaginal/rectal bleeding or skin irritaiton.  She would like to have her first HDR treatment scheduled at least 5 days after her chemotherapy starts on 03/03/16 to recuperate.                                 Set-up films were reviewed.                                 The chart was checked. Physical Findings. . .  height is 5\' 3"  (1.6 m) and weight is 131 lb 3.2 oz (59.5 kg). Her oral temperature is 97.9 F (36.6 C). Her blood pressure is 129/86 and her pulse is 80. Her oxygen saturation is 100%.  Weight essentially stable.  No significant changes. The lungs are clear to auscultation. The heart has a regular rhythm and rate. The abdomen is soft and nontender with normal bowel sounds.  Impression . . . . . . . The patient is tolerating radiation. Plan . . . . . . . . . . . . Continue treatment as planned. She has a vacation planned 02/24/2016-03/02/2016. She is scheduled for CT sim and her first HDR treatment on 03/07/16. We will try to push it back a few days (8/8, 8/9, or 8/10) and call her of the new treatment date. I advised the patient to use a  sitz bath for dysuria. I asked if she might need medication for the dysuria, but she declined.  ________________________________   Blair Promise, PhD, MD  This document serves as a record of services personally performed by Gery Pray, MD. It was created on his behalf by Darcus Austin, a trained medical scribe. The creation of this record is based on the scribe's personal observations and the provider's statements to them. This document has been checked and approved by the attending provider.

## 2016-02-23 NOTE — Progress Notes (Signed)
Ferdie Ping has completed treatment to her pelvis with 25 fractions.  She reports having dysuria on and off that started last Thursday.  She continues to have diarrhea 2 times per day.  She is taking 2 Immodium per day.  She reports having fatigue.  She reports having hematuria, vaginal/rectal bleeding or skin irritaiton.  She would like to have her first HDR treatment scheduled before her chemotherapy starts on 03/03/16.  BP 129/86 (BP Location: Right Arm, Patient Position: Sitting)   Pulse 80   Temp 97.9 F (36.6 C) (Oral)   Ht 5\' 3"  (1.6 m)   Wt 131 lb 3.2 oz (59.5 kg)   SpO2 100%   BMI 23.24 kg/m    Wt Readings from Last 3 Encounters:  02/23/16 131 lb 3.2 oz (59.5 kg)  02/16/16 131 lb 14.4 oz (59.8 kg)  02/15/16 130 lb 4.8 oz (59.1 kg)

## 2016-03-02 ENCOUNTER — Other Ambulatory Visit: Payer: Self-pay | Admitting: Oncology

## 2016-03-02 ENCOUNTER — Encounter (HOSPITAL_COMMUNITY): Payer: Self-pay | Admitting: Interventional Radiology

## 2016-03-02 ENCOUNTER — Ambulatory Visit (HOSPITAL_COMMUNITY)
Admission: RE | Admit: 2016-03-02 | Discharge: 2016-03-02 | Disposition: A | Discharge status: Home / Self Care | Payer: BLUE CROSS/BLUE SHIELD | Source: Ambulatory Visit | Attending: Oncology | Admitting: Oncology

## 2016-03-02 DIAGNOSIS — C541 Malignant neoplasm of endometrium: Secondary | ICD-10-CM

## 2016-03-02 DIAGNOSIS — Z9221 Personal history of antineoplastic chemotherapy: Secondary | ICD-10-CM | POA: Insufficient documentation

## 2016-03-02 HISTORY — PX: IR GENERIC HISTORICAL: IMG1180011

## 2016-03-02 MED ORDER — LIDOCAINE HCL 1 % IJ SOLN
INTRAMUSCULAR | Status: AC
  Filled 2016-03-02: qty 20

## 2016-03-02 MED ORDER — HEPARIN SOD (PORK) LOCK FLUSH 100 UNIT/ML IV SOLN
INTRAVENOUS | Status: AC
  Filled 2016-03-02: qty 5

## 2016-03-02 MED ORDER — HEPARIN SOD (PORK) LOCK FLUSH 100 UNIT/ML IV SOLN
INTRAVENOUS | Status: DC | PRN
  Administered 2016-03-02: 500 [IU]

## 2016-03-02 MED ORDER — LIDOCAINE HCL 1 % IJ SOLN
INTRAMUSCULAR | Status: DC | PRN
  Administered 2016-03-02: 5 mL

## 2016-03-03 ENCOUNTER — Ambulatory Visit (HOSPITAL_BASED_OUTPATIENT_CLINIC_OR_DEPARTMENT_OTHER): Payer: BLUE CROSS/BLUE SHIELD

## 2016-03-03 VITALS — BP 134/80 | HR 96 | Temp 98.0°F | Resp 18

## 2016-03-03 DIAGNOSIS — Z5111 Encounter for antineoplastic chemotherapy: Secondary | ICD-10-CM | POA: Diagnosis not present

## 2016-03-03 DIAGNOSIS — C541 Malignant neoplasm of endometrium: Secondary | ICD-10-CM

## 2016-03-03 LAB — COMPREHENSIVE METABOLIC PANEL
ALK PHOS: 92 U/L (ref 40–150)
ALT: 16 U/L (ref 0–55)
ANION GAP: 12 meq/L — AB (ref 3–11)
AST: 15 U/L (ref 5–34)
Albumin: 4.1 g/dL (ref 3.5–5.0)
BUN: 11.7 mg/dL (ref 7.0–26.0)
CHLORIDE: 105 meq/L (ref 98–109)
CO2: 22 meq/L (ref 22–29)
Calcium: 10.4 mg/dL (ref 8.4–10.4)
Creatinine: 0.9 mg/dL (ref 0.6–1.1)
EGFR: 68 mL/min/{1.73_m2} — ABNORMAL LOW (ref >90)
Glucose: 200 mg/dl — ABNORMAL HIGH (ref 70–140)
Potassium: 3.6 mEq/L (ref 3.5–5.1)
SODIUM: 139 meq/L (ref 136–145)
Total Bilirubin: 0.38 mg/dL (ref 0.20–1.20)
Total Protein: 7.3 g/dL (ref 6.4–8.3)

## 2016-03-03 LAB — CBC WITH DIFFERENTIAL/PLATELET
BASO%: 0 % (ref 0.0–2.0)
Basophils Absolute: 0 10*3/uL (ref 0.0–0.1)
EOS%: 0 % (ref 0.0–7.0)
Eosinophils Absolute: 0 10*3/uL (ref 0.0–0.5)
HCT: 37.3 % (ref 34.8–46.6)
HGB: 12.8 g/dL (ref 11.6–15.9)
LYMPH%: 3.3 % — AB (ref 14.0–49.7)
MCH: 37.8 pg — ABNORMAL HIGH (ref 25.1–34.0)
MCHC: 34.3 g/dL (ref 31.5–36.0)
MCV: 110 fL — ABNORMAL HIGH (ref 79.5–101.0)
MONO#: 0.1 10*3/uL (ref 0.1–0.9)
MONO%: 1 % (ref 0.0–14.0)
NEUT#: 9.3 10*3/uL — ABNORMAL HIGH (ref 1.5–6.5)
NEUT%: 95.7 % — AB (ref 38.4–76.8)
PLATELETS: 313 10*3/uL (ref 145–400)
RBC: 3.39 10*6/uL — AB (ref 3.70–5.45)
RDW: 15.3 % — ABNORMAL HIGH (ref 11.2–14.5)
WBC: 9.7 10*3/uL (ref 3.9–10.3)
lymph#: 0.3 10*3/uL — ABNORMAL LOW (ref 0.9–3.3)

## 2016-03-03 MED ORDER — SODIUM CHLORIDE 0.9% FLUSH
10.0000 mL | INTRAVENOUS | Status: DC | PRN
  Administered 2016-03-03: 10 mL
  Filled 2016-03-03: qty 10

## 2016-03-03 MED ORDER — DIPHENHYDRAMINE HCL 50 MG/ML IJ SOLN
25.0000 mg | Freq: Once | INTRAMUSCULAR | Status: AC
  Administered 2016-03-03: 25 mg via INTRAVENOUS

## 2016-03-03 MED ORDER — SODIUM CHLORIDE 0.9 % IV SOLN
Freq: Once | INTRAVENOUS | Status: AC
  Administered 2016-03-03: 12:00:00 via INTRAVENOUS
  Filled 2016-03-03: qty 5

## 2016-03-03 MED ORDER — FAMOTIDINE IN NACL 20-0.9 MG/50ML-% IV SOLN
20.0000 mg | Freq: Once | INTRAVENOUS | Status: AC
  Administered 2016-03-03: 20 mg via INTRAVENOUS

## 2016-03-03 MED ORDER — SODIUM CHLORIDE 0.9 % IV SOLN
526.8000 mg | Freq: Once | INTRAVENOUS | Status: AC
  Administered 2016-03-03: 530 mg via INTRAVENOUS
  Filled 2016-03-03: qty 53

## 2016-03-03 MED ORDER — SODIUM CHLORIDE 0.9 % IV SOLN
Freq: Once | INTRAVENOUS | Status: AC
  Administered 2016-03-03: 11:00:00 via INTRAVENOUS

## 2016-03-03 MED ORDER — ONDANSETRON HCL 40 MG/20ML IJ SOLN
Freq: Once | INTRAMUSCULAR | Status: AC
  Administered 2016-03-03: 12:00:00 via INTRAVENOUS
  Filled 2016-03-03: qty 4

## 2016-03-03 MED ORDER — FAMOTIDINE IN NACL 20-0.9 MG/50ML-% IV SOLN
INTRAVENOUS | Status: AC
  Filled 2016-03-03: qty 50

## 2016-03-03 MED ORDER — HEPARIN SOD (PORK) LOCK FLUSH 100 UNIT/ML IV SOLN
250.0000 [IU] | Freq: Once | INTRAVENOUS | Status: AC | PRN
  Administered 2016-03-03: 250 [IU]
  Filled 2016-03-03: qty 5

## 2016-03-03 MED ORDER — SODIUM CHLORIDE 0.9 % IV SOLN
175.0000 mg/m2 | Freq: Once | INTRAVENOUS | Status: AC
  Administered 2016-03-03: 294 mg via INTRAVENOUS
  Filled 2016-03-03: qty 49

## 2016-03-03 MED ORDER — DIPHENHYDRAMINE HCL 50 MG/ML IJ SOLN
INTRAMUSCULAR | Status: AC
  Filled 2016-03-03: qty 1

## 2016-03-03 NOTE — Progress Notes (Signed)
Patient questioned whether she should receive granix on 03/04/2016 and 03/05/2016 due to her [place] being 9.3. Spoke with Dr. Marko Plume. Granix is given regardless of ANC due to delay neutropenia after receiving chemotherapy. Patient and spouse verbalized understanding to above mentioned plan.

## 2016-03-03 NOTE — Patient Instructions (Signed)
[facility] Discharge Instructions for Patients Receiving Chemotherapy  Today you received the following chemotherapy agents Taxol/Carboplatin To help prevent nausea and vomiting after your treatment, we encourage you to take your nausea medication as prescribed.   If you develop nausea and vomiting that is not controlled by your nausea medication, call the clinic.   BELOW ARE SYMPTOMS THAT SHOULD BE REPORTED IMMEDIATELY:  *FEVER GREATER THAN 100.5 F  *CHILLS WITH OR WITHOUT FEVER  NAUSEA AND VOMITING THAT IS NOT CONTROLLED WITH YOUR NAUSEA MEDICATION  *UNUSUAL SHORTNESS OF BREATH  *UNUSUAL BRUISING OR BLEEDING  TENDERNESS IN MOUTH AND THROAT WITH OR WITHOUT PRESENCE OF ULCERS  *URINARY PROBLEMS  *BOWEL PROBLEMS  UNUSUAL RASH Items with * indicate a potential emergency and should be followed up as soon as possible.  Feel free to call the clinic you have any questions or concerns. The clinic phone number is [phone].  Please show the CHEMO ALERT CARD at check-in to the Emergency Department and triage nurse.   

## 2016-03-04 ENCOUNTER — Ambulatory Visit (HOSPITAL_BASED_OUTPATIENT_CLINIC_OR_DEPARTMENT_OTHER): Payer: BLUE CROSS/BLUE SHIELD

## 2016-03-04 VITALS — BP 135/75 | HR 67 | Temp 97.7°F | Resp 20

## 2016-03-04 DIAGNOSIS — C541 Malignant neoplasm of endometrium: Secondary | ICD-10-CM

## 2016-03-04 DIAGNOSIS — D701 Agranulocytosis secondary to cancer chemotherapy: Secondary | ICD-10-CM | POA: Diagnosis not present

## 2016-03-04 MED ORDER — TBO-FILGRASTIM 300 MCG/0.5ML ~~LOC~~ SOSY
300.0000 ug | PREFILLED_SYRINGE | Freq: Once | SUBCUTANEOUS | Status: AC
  Administered 2016-03-04: 300 ug via SUBCUTANEOUS
  Filled 2016-03-04: qty 0.5

## 2016-03-04 NOTE — Patient Instructions (Signed)

## 2016-03-05 ENCOUNTER — Ambulatory Visit (HOSPITAL_BASED_OUTPATIENT_CLINIC_OR_DEPARTMENT_OTHER): Payer: BLUE CROSS/BLUE SHIELD

## 2016-03-05 VITALS — BP 123/73 | HR 75 | Temp 97.9°F | Resp 18

## 2016-03-05 DIAGNOSIS — C541 Malignant neoplasm of endometrium: Secondary | ICD-10-CM

## 2016-03-05 DIAGNOSIS — D701 Agranulocytosis secondary to cancer chemotherapy: Secondary | ICD-10-CM | POA: Diagnosis not present

## 2016-03-05 MED ORDER — TBO-FILGRASTIM 300 MCG/0.5ML ~~LOC~~ SOSY
300.0000 ug | PREFILLED_SYRINGE | Freq: Once | SUBCUTANEOUS | Status: AC
  Administered 2016-03-05: 300 ug via SUBCUTANEOUS

## 2016-03-07 ENCOUNTER — Encounter: Payer: Self-pay | Admitting: Radiation Oncology

## 2016-03-07 ENCOUNTER — Telehealth: Payer: Self-pay | Admitting: *Deleted

## 2016-03-07 ENCOUNTER — Ambulatory Visit: Payer: Self-pay | Admitting: Radiation Oncology

## 2016-03-07 ENCOUNTER — Ambulatory Visit (HOSPITAL_BASED_OUTPATIENT_CLINIC_OR_DEPARTMENT_OTHER): Payer: BLUE CROSS/BLUE SHIELD

## 2016-03-07 ENCOUNTER — Ambulatory Visit: Payer: BLUE CROSS/BLUE SHIELD | Admitting: Radiation Oncology

## 2016-03-07 VITALS — BP 144/72 | HR 105 | Temp 97.1°F | Resp 16

## 2016-03-07 DIAGNOSIS — C541 Malignant neoplasm of endometrium: Secondary | ICD-10-CM

## 2016-03-07 DIAGNOSIS — Z452 Encounter for adjustment and management of vascular access device: Secondary | ICD-10-CM | POA: Diagnosis not present

## 2016-03-07 DIAGNOSIS — Z510 Encounter for antineoplastic radiation therapy: Secondary | ICD-10-CM | POA: Diagnosis not present

## 2016-03-07 DIAGNOSIS — Z95828 Presence of other vascular implants and grafts: Secondary | ICD-10-CM

## 2016-03-07 MED ORDER — HEPARIN SOD (PORK) LOCK FLUSH 100 UNIT/ML IV SOLN
500.0000 [IU] | Freq: Once | INTRAVENOUS | Status: AC
  Administered 2016-03-07: 250 [IU] via INTRAVENOUS
  Filled 2016-03-07: qty 5

## 2016-03-07 MED ORDER — SODIUM CHLORIDE 0.9% FLUSH
10.0000 mL | INTRAVENOUS | Status: DC | PRN
  Administered 2016-03-07: 10 mL via INTRAVENOUS
  Filled 2016-03-07: qty 10

## 2016-03-07 NOTE — Patient Instructions (Signed)
PICC Home Guide A peripherally inserted central catheter (PICC) is a long, thin, flexible tube that is inserted into a vein in the upper arm. It is a form of intravenous (IV) access. It is considered to be a "central" line because the tip of the PICC ends in a large vein in your chest. This large vein is called the superior vena cava (SVC). The PICC tip ends in the SVC because there is a lot of blood flow in the SVC. This allows medicines and IV fluids to be quickly distributed throughout the body. The PICC is inserted using a sterile technique by a specially trained nurse or physician. After the PICC is inserted, a chest X-ray exam is done to be sure it is in the correct place.  A PICC may be placed for different reasons, such as:  To give medicines and liquid nutrition that can only be given through a central line. Examples are:  Certain antibiotic treatments.  Chemotherapy.  Total parenteral nutrition (TPN).  To take frequent blood samples.  To give IV fluids and blood products.  If there is difficulty placing a peripheral intravenous (PIV) catheter. If taken care of properly, a PICC can remain in place for several months. A PICC can also allow a person to go home from the hospital early. Medicine and PICC care can be managed at home by a family member or home health care team. WHAT PROBLEMS CAN HAPPEN WHEN I HAVE A PICC? Problems with a PICC can occasionally occur. These may include the following:  A blood clot (thrombus) forming in or at the tip of the PICC. This can cause the PICC to become clogged. A clot-dissolving medicine called tissue plasminogen activator (tPA) can be given through the PICC to help break up the clot.  Inflammation of the vein (phlebitis) in which the PICC is placed. Signs of inflammation may include redness, pain at the insertion site, red streaks, or being able to feel a "cord" in the vein where the PICC is located.  Infection in the PICC or at the insertion  site. Signs of infection may include fever, chills, redness, swelling, or pus drainage from the PICC insertion site.  PICC movement (malposition). The PICC tip may move from its original position due to excessive physical activity, forceful coughing, sneezing, or vomiting.  A break or cut in the PICC. It is important to not use scissors near the PICC.  Nerve or tendon irritation or injury during PICC insertion. WHAT SHOULD I KEEP IN MIND ABOUT ACTIVITIES WHEN I HAVE A PICC?  You may bend your arm and move it freely. If your PICC is near or at the bend of your elbow, avoid activity with repeated motion at the elbow.  Rest at home for the remainder of the day following PICC line insertion.  Avoid lifting heavy objects as instructed by your health care provider.  Avoid using a crutch with the arm on the same side as your PICC. You may need to use a walker. WHAT SHOULD I KNOW ABOUT MY PICC DRESSING?  Keep your PICC bandage (dressing) clean and dry to prevent infection.  Ask your health care provider when you may shower. Ask your health care provider to teach you how to wrap the PICC when you do take a shower.  Change the PICC dressing as instructed by your health care provider.  Change your PICC dressing if it becomes loose or wet. WHAT SHOULD I KNOW ABOUT PICC CARE?  Check the PICC insertion site   daily for leakage, redness, swelling, or pain.  Do not take a bath, swim, or use hot tubs when you have a PICC. Cover PICC line with clear plastic wrap and tape to keep it dry while showering.  Flush the PICC as directed by your health care provider. Let your health care provider know right away if the PICC is difficult to flush or does not flush. Do not use force to flush the PICC.  Do not use a syringe that is less than 10 mL to flush the PICC.  Never pull or tug on the PICC.  Avoid blood pressure checks on the arm with the PICC.  Keep your PICC identification card with you at all  times.  Do not take the PICC out yourself. Only a trained clinical professional should remove the PICC. SEEK IMMEDIATE MEDICAL CARE IF:  Your PICC is accidentally pulled all the way out. If this happens, cover the insertion site with a bandage or gauze dressing. Do not throw the PICC away. Your health care provider will need to inspect it.  Your PICC was tugged or pulled and has partially come out. Do not  push the PICC back in.  There is any type of drainage, redness, or swelling where the PICC enters the skin.  You cannot flush the PICC, it is difficult to flush, or the PICC leaks around the insertion site when it is flushed.  You hear a "flushing" sound when the PICC is flushed.  You have pain, discomfort, or numbness in your arm, shoulder, or jaw on the same side as the PICC.  You feel your heart "racing" or skipping beats.  You notice a hole or tear in the PICC.  You develop chills or a fever. MAKE SURE YOU:   Understand these instructions.  Will watch your condition.  Will get help right away if you are not doing well or get worse.   This information is not intended to replace advice given to you by your health care provider. Make sure you discuss any questions you have with your health care provider.   Document Released: 01/22/2003 Document Revised: 08/08/2014 Document Reviewed: 03/25/2013 Elsevier Interactive Patient Education 2016 [employer]  

## 2016-03-07 NOTE — Telephone Encounter (Signed)
CALLED PATIENT TO REMIND OF APPTS. FOR 03-08-16, LVM FOR A RETURN CALL

## 2016-03-08 ENCOUNTER — Ambulatory Visit
Admission: RE | Admit: 2016-03-08 | Discharge: 2016-03-08 | Disposition: A | Discharge status: Home / Self Care | Payer: BLUE CROSS/BLUE SHIELD | Source: Ambulatory Visit | Attending: Radiation Oncology | Admitting: Radiation Oncology

## 2016-03-08 ENCOUNTER — Other Ambulatory Visit: Payer: Self-pay

## 2016-03-08 ENCOUNTER — Encounter: Payer: Self-pay | Admitting: Radiation Oncology

## 2016-03-08 ENCOUNTER — Other Ambulatory Visit: Payer: Self-pay | Admitting: Oncology

## 2016-03-08 DIAGNOSIS — C541 Malignant neoplasm of endometrium: Secondary | ICD-10-CM

## 2016-03-08 DIAGNOSIS — Z510 Encounter for antineoplastic radiation therapy: Secondary | ICD-10-CM | POA: Diagnosis not present

## 2016-03-08 MED ORDER — LORAZEPAM 0.5 MG PO TABS: ORAL_TABLET | ORAL | 0 refills | Status: DC

## 2016-03-08 NOTE — Progress Notes (Signed)
Radiation Oncology         (336) [phone] ________________________________  Name: Jacqueline Hammond MRN: [id number]  Date: 03/08/2016  DOB: [birth year]  Vaginal Brachytherapy Procedure Note  CC: [name], MD Dorothyann Gibbs, NP    ICD-9-CM ICD-10-CM   1. 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  Narrative: The patient returns today for vaginal cylinder fitting. She denies pain, but reports having severe fatigue and thinks she may be dehyrated. She has lost 6 lbs since 02/23/16. Orthostatic vitals taken: bp sitting 114/78, hr 122, standing: bp 92/73, hr 126. She had chemotherapy on Thursday. She denies dysuria, diarrhea, nausea, skin irritation, fever/chills, and vaginal/rectal bleeding. She reports eating small amounts of food at a time.  ALLERGIES: is allergic to codeine sulfate and neomycin-bacitracin zn-polymyx.  Meds: Current Outpatient Prescriptions  Medication Sig Dispense Refill  . dexamethasone (DECADRON) 4 MG tablet Take 5 tablets with food 12 hrs and 6 hrs before Taxol Chemotherapy 30 tablet 0  . docusate sodium (COLACE) 100 MG capsule Take 100 mg by mouth 2 (two) times daily as needed for mild constipation. Reported on 02/16/2016    . atorvastatin (LIPITOR) 20 MG tablet TAKE 1 TABLET BY MOUTH EVERY DAY (Patient not taking: Reported on 03/08/2016) 30 tablet 5  . loperamide (IMODIUM) 2 MG capsule Take 2 mg by mouth as needed for diarrhea or loose stools.    Marland Kitchen LORazepam (ATIVAN) 0.5 MG tablet Place 1 tablet under the tongue or swallow every 6 hrs as needed for nausea.  Will make Drowsy. 20 tablet 0  . ondansetron (ZOFRAN) 8 MG tablet Take 1 tablet (8 mg total) by mouth every 8 (eight) hours as needed for nausea or vomiting (Will not make Drowsy.). (Patient not taking: Reported on 02/09/2016) 30 tablet 1   . polyethylene glycol (MIRALAX / GLYCOLAX) packet Take 17 g by mouth 2 (two) times daily as needed. Reported on 02/16/2016    . temazepam (RESTORIL) 15 MG capsule TAKE ONE CAPSULE BY MOUTH AT BEDTIME AS NEEDED FOR SLEEP 20 capsule 0   No current facility-administered medications for this encounter.     Physical Findings: The patient is in no acute distress. Patient is alert and oriented.  height is 5\' 3"  (1.6 m) and weight is 125 lb 14.4 oz (57.1 kg). Her oral temperature is 97.9 F (36.6 C). Her blood pressure is 92/73 and her pulse is 126 (abnormal). Her oxygen saturation is 100%.   No palpable cervical, supraclavicular or axillary lymphoadenopathy. The heart has a regular rate and rhythm. The lungs are clear to auscultation. Abdomen soft and non-tender.  Patient was fitted for a vaginal cylinder. Vaginal cuff intact, no mucosal lesions. No pelvic masses on bimanual exam. The patient will be treated with a 3 cm diameter cylinder with a treatment length of 3 cm. This distended the vaginal vault without undue discomfort. The patient tolerated the procedure well.  Lab Findings: Lab Results  Component Value Date   WBC 9.7 03/03/2016   HGB 12.8 03/03/2016   HCT 37.3 03/03/2016   MCV 110.0 (H) 03/03/2016   PLT 313 03/03/2016    Radiographic Findings: Ir Fluoro Guide Cv Line Right  Result Date: 03/02/2016 CLINICAL DATA:  Endometrial carcinoma, needs durable venous access for chemotherapy EXAM: PICC PLACEMENT WITH ULTRASOUND AND FLUOROSCOPY FLUOROSCOPY TIME:  6 seconds, 1 mGy TECHNIQUE: After written informed consent was obtained, patient was placed in the supine position on angiographic table. Patency of the right brachial vein was confirmed with ultrasound with image documentation. An appropriate skin site was determined. Skin site was marked. Region was prepped using maximum barrier technique including cap and mask, sterile gown, sterile gloves, large sterile sheet, and Chlorhexidine as  cutaneous antisepsis. The region was infiltrated locally with 1% lidocaine. Under real-time ultrasound guidance, the right brachial vein was accessed with a 21 gauge micropuncture needle; the needle tip within the vein was confirmed with ultrasound image documentation. Needle exchanged over a 018 guidewire for a peel-away sheath, through which a 5-French double-lumen power injectable PICC trimmed to 39cm was advanced, positioned with its tip near the cavoatrial junction. Spot chest radiograph confirms appropriate catheter position. Catheter was flushed per protocol and secured externally. The patient tolerated procedure well. COMPLICATIONS: COMPLICATIONS none IMPRESSION: 1. Technically successful five [place] double lumen power injectable PICC placement Electronically Signed   By: Lucrezia Europe M.D.   On: 03/02/2016 16:27   Ir US Guide Vasc Access Right  Result Date: 03/02/2016 CLINICAL DATA:  Endometrial carcinoma, needs durable venous access for chemotherapy EXAM: PICC PLACEMENT WITH ULTRASOUND AND FLUOROSCOPY FLUOROSCOPY TIME:  6 seconds, 1 mGy TECHNIQUE: After written informed consent was obtained, patient was placed in the supine position on angiographic table. Patency of the right brachial vein was confirmed with ultrasound with image documentation. An appropriate skin site was determined. Skin site was marked. Region was prepped using maximum barrier technique including cap and mask, sterile gown, sterile gloves, large sterile sheet, and Chlorhexidine as cutaneous antisepsis. The region was infiltrated locally with 1% lidocaine. Under real-time ultrasound guidance, the right brachial vein was accessed with a 21 gauge micropuncture needle; the needle tip within the vein was confirmed with ultrasound image documentation. Needle exchanged over a 018 guidewire for a peel-away sheath, through which a 5-French double-lumen power injectable PICC trimmed to 39cm was advanced, positioned with its tip near the  cavoatrial junction. Spot chest radiograph confirms appropriate catheter position. Catheter was flushed per protocol and secured externally. The patient tolerated procedure well. COMPLICATIONS: COMPLICATIONS none IMPRESSION: 1. Technically successful five [place] double lumen power injectable PICC placement Electronically Signed   By: Lucrezia Europe M.D.   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 The patient is appropriate to begin vaginal brachytherapy. The patient will be treated with a 3 cm diameter cylinder with a treatment length of 3 cm. I offered the patient IV fluids, but the patient expressed that she will do her best to drink additional fluids on her own.  Plan: The patient will proceed with CT simulation and her first vaginal brachytherapy treatment later today. _______________________________   Blair Promise, PhD, MD  This document serves as a record of services personally performed by Gery Pray, MD. It was created on his behalf by Darcus Austin, a trained medical scribe. The creation of this record is based on the scribe's personal observations and the provider's statements to them. This document has been checked and approved by the attending provider.

## 2016-03-08 NOTE — Progress Notes (Addendum)
Jacqueline Hammond here for follow up.  She denies pain.  She reports having severe fatigue and thinks she may be dehyrated.  She has lost 6 lbs since 02/23/16.  Orthostatic vitals taken: bp sitting 114/78, hr 122, standing: bp 92/73, hr 126.  She had chemotherapy on Thursday.  She denies having dysuria, diarrhea, skin irritation and vaginal/rectal bleeding.  She reports she is eating small amounts and denies having nausea.  BP 92/73 (BP Location: Left Arm, Patient Position: Standing)   Pulse (!) 126   Temp 97.9 F (36.6 C) (Oral)   Ht 5\' 3"  (1.6 m)   Wt 125 lb 14.4 oz (57.1 kg)   SpO2 100%   BMI 22.30 kg/m    Wt Readings from Last 3 Encounters:  03/08/16 125 lb 14.4 oz (57.1 kg)  02/23/16 131 lb 3.2 oz (59.5 kg)  02/16/16 131 lb 14.4 oz (59.8 kg)

## 2016-03-08 NOTE — Progress Notes (Signed)
  Radiation Oncology         [phone]) [phone] ________________________________  Name: Jacqueline Hammond MRN: [id number]  Date: 03/08/2016  DOB: Sep 10, 1950  SIMULATION AND TREATMENT PLANNING NOTE HDR BRACHYTHERAPY  DIAGNOSIS:    ICD-9-CM ICD-10-CM   1. 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  Identity was confirmed.  All relevant records and images related to the planned course of therapy were reviewed.  The patient freely provided informed written consent to proceed with treatment after reviewing the details related to the planned course of therapy. The consent form was witnessed and verified by the simulation staff.  Then, the patient was set-up in a stable reproducible  supine position for radiation therapy.  CT images were obtained.  Surface markings were placed.  The CT images were loaded into the planning software.  Then the target and avoidance structures were contoured.  Treatment planning then occurred.  The radiation prescription was entered and confirmed.   I have requested : Brachytherapy Isodose Plan and Dosimetry Calculations to plan the radiation distribution.    PLAN:  The patient will receive 18 Gy in 3 fractions.  The patient will be treated with a 3 cm diameter segmented cylinder with a treatment length of 3 cm. Patient will receive 6 gray to the mucosal surface using iridium 192 as the high-dose-rate source. ________________________________  Blair Promise, PhD, MD  This document serves as a record of services personally performed by Gery Pray, MD. It was created on his behalf by Darcus Austin, a trained medical scribe. The creation of this record is based on the scribe's personal observations and the provider's statements to them. This document has been checked and approved by the attending provider.

## 2016-03-08 NOTE — Progress Notes (Signed)
  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 She will be treated with a 3 cm diameter segmented cylinder. This conforms to her anatomy without undue discomfort.  Vaginal brachytherapy procedure node: The patient was brought to the [place] suite. Identity was confirmed. All relevant records and images related to the planned course of therapy were reviewed. The patient freely provided informed written consent to proceed with treatment after reviewing the details related to the planned course of therapy. The consent form was witnessed and verified by the simulation staff. Then, the patient was set-up in a stable reproducible supine position for radiation therapy. The patient's custom vaginal cylinder was placed in the proximal vagina. This was affixed to the CT/MR stabilization plate to prevent slippage. Patient tolerated the placement well.  Verification simulation note:  A fiducial marker was placed within the vaginal cylinder. An AP and lateral film was then obtained through the pelvis area. This documented accurate position of the vaginal cylinder for treatment.  HDR BRACHYTHERAPY TREATMENT  The remote afterloading device was affixed to the vaginal cylinder by catheter. Patient then proceeded to undergo her first high-dose-rate treatment directed at the proximal vagina. The patient was prescribed a dose of 6 gray to be delivered to the mucosal surface. Treatment length was 3 cm. Patient was treated with 1 channel using 7 dwell positions. Treatment time was 234.9 seconds. Iridium 192 was the high-dose-rate source for treatment. The patient tolerated the treatment well.  After completion of her therapy, a radiation survey was performed documenting return of the iridium source into the [employer] safe.   PLAN: The patient will return for her second HDR treatment on 03/17/16. ________________________________  Blair Promise, PhD, MD   This document serves as a record of services personally performed by Gery Pray, MD. It was created on his behalf by Darcus Austin, a trained medical scribe. The creation of this record is based on the scribe's personal observations and the provider's statements to them. This document has been checked and approved by the attending provider.

## 2016-03-09 ENCOUNTER — Encounter: Payer: Self-pay | Admitting: Radiation Oncology

## 2016-03-10 ENCOUNTER — Ambulatory Visit (HOSPITAL_BASED_OUTPATIENT_CLINIC_OR_DEPARTMENT_OTHER): Payer: BLUE CROSS/BLUE SHIELD

## 2016-03-10 DIAGNOSIS — C541 Malignant neoplasm of endometrium: Secondary | ICD-10-CM | POA: Diagnosis not present

## 2016-03-10 DIAGNOSIS — Z452 Encounter for adjustment and management of vascular access device: Secondary | ICD-10-CM | POA: Diagnosis not present

## 2016-03-10 DIAGNOSIS — Z95828 Presence of other vascular implants and grafts: Secondary | ICD-10-CM | POA: Insufficient documentation

## 2016-03-10 MED ORDER — SODIUM CHLORIDE 0.9 % IJ SOLN
10.0000 mL | INTRAMUSCULAR | Status: DC | PRN
  Administered 2016-03-10: 10 mL via INTRAVENOUS
  Filled 2016-03-10: qty 10

## 2016-03-10 MED ORDER — HEPARIN SOD (PORK) LOCK FLUSH 100 UNIT/ML IV SOLN
500.0000 [IU] | Freq: Once | INTRAVENOUS | Status: AC | PRN
  Administered 2016-03-10: 500 [IU] via INTRAVENOUS
  Filled 2016-03-10: qty 5

## 2016-03-10 NOTE — Patient Instructions (Signed)
PICC Home Guide A peripherally inserted central catheter (PICC) is a long, thin, flexible tube that is inserted into a vein in the upper arm. It is a form of intravenous (IV) access. It is considered to be a "central" line because the tip of the PICC ends in a large vein in your chest. This large vein is called the superior vena cava (SVC). The PICC tip ends in the SVC because there is a lot of blood flow in the SVC. This allows medicines and IV fluids to be quickly distributed throughout the body. The PICC is inserted using a sterile technique by a specially trained nurse or physician. After the PICC is inserted, a chest X-ray exam is done to be sure it is in the correct place.  A PICC may be placed for different reasons, such as:  To give medicines and liquid nutrition that can only be given through a central line. Examples are:  Certain antibiotic treatments.  Chemotherapy.  Total parenteral nutrition (TPN).  To take frequent blood samples.  To give IV fluids and blood products.  If there is difficulty placing a peripheral intravenous (PIV) catheter. If taken care of properly, a PICC can remain in place for several months. A PICC can also allow a person to go home from the hospital early. Medicine and PICC care can be managed at home by a family member or home health care team. WHAT PROBLEMS CAN HAPPEN WHEN I HAVE A PICC? Problems with a PICC can occasionally occur. These may include the following:  A blood clot (thrombus) forming in or at the tip of the PICC. This can cause the PICC to become clogged. A clot-dissolving medicine called tissue plasminogen activator (tPA) can be given through the PICC to help break up the clot.  Inflammation of the vein (phlebitis) in which the PICC is placed. Signs of inflammation may include redness, pain at the insertion site, red streaks, or being able to feel a "cord" in the vein where the PICC is located.  Infection in the PICC or at the insertion  site. Signs of infection may include fever, chills, redness, swelling, or pus drainage from the PICC insertion site.  PICC movement (malposition). The PICC tip may move from its original position due to excessive physical activity, forceful coughing, sneezing, or vomiting.  A break or cut in the PICC. It is important to not use scissors near the PICC.  Nerve or tendon irritation or injury during PICC insertion. WHAT SHOULD I KEEP IN MIND ABOUT ACTIVITIES WHEN I HAVE A PICC?  You may bend your arm and move it freely. If your PICC is near or at the bend of your elbow, avoid activity with repeated motion at the elbow.  Rest at home for the remainder of the day following PICC line insertion.  Avoid lifting heavy objects as instructed by your health care provider.  Avoid using a crutch with the arm on the same side as your PICC. You may need to use a walker. WHAT SHOULD I KNOW ABOUT MY PICC DRESSING?  Keep your PICC bandage (dressing) clean and dry to prevent infection.  Ask your health care provider when you may shower. Ask your health care provider to teach you how to wrap the PICC when you do take a shower.  Change the PICC dressing as instructed by your health care provider.  Change your PICC dressing if it becomes loose or wet. WHAT SHOULD I KNOW ABOUT PICC CARE?  Check the PICC insertion site   daily for leakage, redness, swelling, or pain.  Do not take a bath, swim, or use hot tubs when you have a PICC. Cover PICC line with clear plastic wrap and tape to keep it dry while showering.  Flush the PICC as directed by your health care provider. Let your health care provider know right away if the PICC is difficult to flush or does not flush. Do not use force to flush the PICC.  Do not use a syringe that is less than 10 mL to flush the PICC.  Never pull or tug on the PICC.  Avoid blood pressure checks on the arm with the PICC.  Keep your PICC identification card with you at all  times.  Do not take the PICC out yourself. Only a trained clinical professional should remove the PICC. SEEK IMMEDIATE MEDICAL CARE IF:  Your PICC is accidentally pulled all the way out. If this happens, cover the insertion site with a bandage or gauze dressing. Do not throw the PICC away. Your health care provider will need to inspect it.  Your PICC was tugged or pulled and has partially come out. Do not  push the PICC back in.  There is any type of drainage, redness, or swelling where the PICC enters the skin.  You cannot flush the PICC, it is difficult to flush, or the PICC leaks around the insertion site when it is flushed.  You hear a "flushing" sound when the PICC is flushed.  You have pain, discomfort, or numbness in your arm, shoulder, or jaw on the same side as the PICC.  You feel your heart "racing" or skipping beats.  You notice a hole or tear in the PICC.  You develop chills or a fever. MAKE SURE YOU:   Understand these instructions.  Will watch your condition.  Will get help right away if you are not doing well or get worse.   This information is not intended to replace advice given to you by your health care provider. Make sure you discuss any questions you have with your health care provider.   Document Released: 01/22/2003 Document Revised: 08/08/2014 Document Reviewed: 03/25/2013 Elsevier Interactive Patient Education 2016 [employer]  

## 2016-03-13 ENCOUNTER — Other Ambulatory Visit: Payer: Self-pay | Admitting: Oncology

## 2016-03-14 ENCOUNTER — Ambulatory Visit (HOSPITAL_BASED_OUTPATIENT_CLINIC_OR_DEPARTMENT_OTHER): Payer: BLUE CROSS/BLUE SHIELD | Admitting: Oncology

## 2016-03-14 ENCOUNTER — Encounter: Payer: Self-pay | Admitting: Oncology

## 2016-03-14 ENCOUNTER — Other Ambulatory Visit (HOSPITAL_BASED_OUTPATIENT_CLINIC_OR_DEPARTMENT_OTHER): Payer: BLUE CROSS/BLUE SHIELD

## 2016-03-14 ENCOUNTER — Ambulatory Visit: Payer: BLUE CROSS/BLUE SHIELD

## 2016-03-14 ENCOUNTER — Telehealth: Payer: Self-pay | Admitting: Oncology

## 2016-03-14 VITALS — BP 139/95 | HR 94 | Temp 98.3°F | Resp 18 | Ht 63.0 in | Wt 128.5 lb

## 2016-03-14 DIAGNOSIS — C541 Malignant neoplasm of endometrium: Secondary | ICD-10-CM | POA: Diagnosis not present

## 2016-03-14 DIAGNOSIS — T451X5A Adverse effect of antineoplastic and immunosuppressive drugs, initial encounter: Secondary | ICD-10-CM

## 2016-03-14 DIAGNOSIS — M8580 Other specified disorders of bone density and structure, unspecified site: Secondary | ICD-10-CM

## 2016-03-14 DIAGNOSIS — D701 Agranulocytosis secondary to cancer chemotherapy: Secondary | ICD-10-CM | POA: Diagnosis not present

## 2016-03-14 DIAGNOSIS — Z720 Tobacco use: Secondary | ICD-10-CM | POA: Diagnosis not present

## 2016-03-14 DIAGNOSIS — Z452 Encounter for adjustment and management of vascular access device: Secondary | ICD-10-CM

## 2016-03-14 DIAGNOSIS — Z95828 Presence of other vascular implants and grafts: Secondary | ICD-10-CM

## 2016-03-14 LAB — CBC WITH DIFFERENTIAL/PLATELET
BASO%: 0.5 % (ref 0.0–2.0)
BASOS ABS: 0 10*3/uL (ref 0.0–0.1)
EOS ABS: 0 10*3/uL (ref 0.0–0.5)
EOS%: 1.4 % (ref 0.0–7.0)
HCT: 33.6 % — ABNORMAL LOW (ref 34.8–46.6)
HGB: 11.2 g/dL — ABNORMAL LOW (ref 11.6–15.9)
LYMPH%: 34.2 % (ref 14.0–49.7)
MCH: 37.9 pg — AB (ref 25.1–34.0)
MCHC: 33.3 g/dL (ref 31.5–36.0)
MCV: 113.8 fL — AB (ref 79.5–101.0)
MONO#: 0.5 10*3/uL (ref 0.1–0.9)
MONO%: 24 % — AB (ref 0.0–14.0)
NEUT%: 39.9 % (ref 38.4–76.8)
NEUTROS ABS: 0.8 10*3/uL — AB (ref 1.5–6.5)
RBC: 2.95 10*6/uL — AB (ref 3.70–5.45)
RDW: 14 % (ref 11.2–14.5)
WBC: 1.9 10*3/uL — AB (ref 3.9–10.3)
lymph#: 0.6 10*3/uL — ABNORMAL LOW (ref 0.9–3.3)

## 2016-03-14 LAB — COMPREHENSIVE METABOLIC PANEL
ALBUMIN: 3.6 g/dL (ref 3.5–5.0)
ALK PHOS: 85 U/L (ref 40–150)
ALT: 25 U/L (ref 0–55)
ANION GAP: 7 meq/L (ref 3–11)
AST: 20 U/L (ref 5–34)
BILIRUBIN TOTAL: 0.31 mg/dL (ref 0.20–1.20)
BUN: 7 mg/dL (ref 7.0–26.0)
CO2: 27 mEq/L (ref 22–29)
Calcium: 9.4 mg/dL (ref 8.4–10.4)
Chloride: 106 mEq/L (ref 98–109)
Creatinine: 0.7 mg/dL (ref 0.6–1.1)
GLUCOSE: 88 mg/dL (ref 70–140)
POTASSIUM: 3.6 meq/L (ref 3.5–5.1)
SODIUM: 140 meq/L (ref 136–145)
TOTAL PROTEIN: 6.5 g/dL (ref 6.4–8.3)

## 2016-03-14 MED ORDER — SODIUM CHLORIDE 0.9 % IJ SOLN
10.0000 mL | INTRAMUSCULAR | Status: DC | PRN
  Administered 2016-03-14: 10 mL via INTRAVENOUS
  Filled 2016-03-14: qty 10

## 2016-03-14 MED ORDER — TBO-FILGRASTIM 300 MCG/0.5ML ~~LOC~~ SOSY
300.0000 ug | PREFILLED_SYRINGE | Freq: Once | SUBCUTANEOUS | Status: AC
  Administered 2016-03-14: 300 ug via SUBCUTANEOUS
  Filled 2016-03-14: qty 0.5

## 2016-03-14 MED ORDER — HEPARIN SOD (PORK) LOCK FLUSH 100 UNIT/ML IV SOLN
500.0000 [IU] | Freq: Once | INTRAVENOUS | Status: AC | PRN
  Administered 2016-03-14: 250 [IU] via INTRAVENOUS
  Filled 2016-03-14: qty 5

## 2016-03-14 NOTE — Telephone Encounter (Signed)
appt made and avs printed °

## 2016-03-14 NOTE — Patient Instructions (Signed)
PICC Home Guide A peripherally inserted central catheter (PICC) is a long, thin, flexible tube that is inserted into a vein in the upper arm. It is a form of intravenous (IV) access. It is considered to be a "central" line because the tip of the PICC ends in a large vein in your chest. This large vein is called the superior vena cava (SVC). The PICC tip ends in the SVC because there is a lot of blood flow in the SVC. This allows medicines and IV fluids to be quickly distributed throughout the body. The PICC is inserted using a sterile technique by a specially trained nurse or physician. After the PICC is inserted, a chest X-ray exam is done to be sure it is in the correct place.  A PICC may be placed for different reasons, such as:  To give medicines and liquid nutrition that can only be given through a central line. Examples are:  Certain antibiotic treatments.  Chemotherapy.  Total parenteral nutrition (TPN).  To take frequent blood samples.  To give IV fluids and blood products.  If there is difficulty placing a peripheral intravenous (PIV) catheter. If taken care of properly, a PICC can remain in place for several months. A PICC can also allow a person to go home from the hospital early. Medicine and PICC care can be managed at home by a family member or home health care team. WHAT PROBLEMS CAN HAPPEN WHEN I HAVE A PICC? Problems with a PICC can occasionally occur. These may include the following:  A blood clot (thrombus) forming in or at the tip of the PICC. This can cause the PICC to become clogged. A clot-dissolving medicine called tissue plasminogen activator (tPA) can be given through the PICC to help break up the clot.  Inflammation of the vein (phlebitis) in which the PICC is placed. Signs of inflammation may include redness, pain at the insertion site, red streaks, or being able to feel a "cord" in the vein where the PICC is located.  Infection in the PICC or at the insertion  site. Signs of infection may include fever, chills, redness, swelling, or pus drainage from the PICC insertion site.  PICC movement (malposition). The PICC tip may move from its original position due to excessive physical activity, forceful coughing, sneezing, or vomiting.  A break or cut in the PICC. It is important to not use scissors near the PICC.  Nerve or tendon irritation or injury during PICC insertion. WHAT SHOULD I KEEP IN MIND ABOUT ACTIVITIES WHEN I HAVE A PICC?  You may bend your arm and move it freely. If your PICC is near or at the bend of your elbow, avoid activity with repeated motion at the elbow.  Rest at home for the remainder of the day following PICC line insertion.  Avoid lifting heavy objects as instructed by your health care provider.  Avoid using a crutch with the arm on the same side as your PICC. You may need to use a walker. WHAT SHOULD I KNOW ABOUT MY PICC DRESSING?  Keep your PICC bandage (dressing) clean and dry to prevent infection.  Ask your health care provider when you may shower. Ask your health care provider to teach you how to wrap the PICC when you do take a shower.  Change the PICC dressing as instructed by your health care provider.  Change your PICC dressing if it becomes loose or wet. WHAT SHOULD I KNOW ABOUT PICC CARE?  Check the PICC insertion site   daily for leakage, redness, swelling, or pain.  Do not take a bath, swim, or use hot tubs when you have a PICC. Cover PICC line with clear plastic wrap and tape to keep it dry while showering.  Flush the PICC as directed by your health care provider. Let your health care provider know right away if the PICC is difficult to flush or does not flush. Do not use force to flush the PICC.  Do not use a syringe that is less than 10 mL to flush the PICC.  Never pull or tug on the PICC.  Avoid blood pressure checks on the arm with the PICC.  Keep your PICC identification card with you at all  times.  Do not take the PICC out yourself. Only a trained clinical professional should remove the PICC. SEEK IMMEDIATE MEDICAL CARE IF:  Your PICC is accidentally pulled all the way out. If this happens, cover the insertion site with a bandage or gauze dressing. Do not throw the PICC away. Your health care provider will need to inspect it.  Your PICC was tugged or pulled and has partially come out. Do not  push the PICC back in.  There is any type of drainage, redness, or swelling where the PICC enters the skin.  You cannot flush the PICC, it is difficult to flush, or the PICC leaks around the insertion site when it is flushed.  You hear a "flushing" sound when the PICC is flushed.  You have pain, discomfort, or numbness in your arm, shoulder, or jaw on the same side as the PICC.  You feel your heart "racing" or skipping beats.  You notice a hole or tear in the PICC.  You develop chills or a fever. MAKE SURE YOU:   Understand these instructions.  Will watch your condition.  Will get help right away if you are not doing well or get worse.   This information is not intended to replace advice given to you by your health care provider. Make sure you discuss any questions you have with your health care provider.   Document Released: 01/22/2003 Document Revised: 08/08/2014 Document Reviewed: 03/25/2013 Elsevier Interactive Patient Education 2016 [employer]  

## 2016-03-14 NOTE — Progress Notes (Signed)
OFFICE PROGRESS NOTE   March 14, 2016   Physicians:Emma Rossi/ Cindie Laroche, Marin Olp, MD (PCP, Abbie Sons, Scarlette Shorts, Gery Pray   INTERVAL HISTORY:  Patient is seen, together with husband, in continuing attention to adjuvant chemotherapy continuing for IIIC1 high grade serous carcinoma of endometrium. Jacqueline Hammond completed pelvic IMRT on 02-23-16 and resumed chemo with cycle 4 carbo taxol on 03-03-16. Jacqueline Hammond is to have HDR x3, thru 03-24-16. Jacqueline Hammond is neutropenic today despite granix x 2 on 8-4 and 03-05-16.  PICC needed for remainder of chemo, placed by IR on 03-02-16.   Patient tells me now that Jacqueline Hammond still had radiation diarrhea when cycle 4 chemo was given, tho Jacqueline Hammond did not communicate that at time of treatment, creatinine stable that day at 0.9, VSS; Jacqueline Hammond also did not let this office know about diarrhea and weakness after the treatment. Jacqueline Hammond recalls feeling worst on day of first HDR on 03-08-16, but improved when Jacqueline Hammond started pushing po fluids including powerade. Bowels are now moving normally once daily. Jacqueline Hammond has had no nausea. Jacqueline Hammond has felt much better past few days, good energy and appetite, no fever or symptoms of infection. Jacqueline Hammond has had no abdominal or pelvic pain, no bleeding, no LE swelling, no SOB or other respiratory symptoms, no peripheral neuropathy. Jacqueline Hammond is glad to have the PICC. Remainder of 10 point Review of Systems negative.    PICC placed by [id number] No genetics testing.    ONCOLOGIC HISTORY Patient presented to Dr Phineas Real as new patient 10-01-15 after noticing recent slight vaginal spotting. PAP had atypical glandular cells, then sonohysterogram and endometrial biopsy on 10-19-15. The uterus was 7.4 x 3.5 x 3.0 cm with endometrial stripe of 6.8 mm. Endometrial biopsy ([id number] from [facility]) showed endometrial carcinoma which appeared high grade with serous features. Jacqueline Hammond was seen by Dr Josephina Shih on 10-23-15, exam not remarkable. Jacqueline Hammond had CT CAP 10-28-15 with no evidence of  metastatic disease, incidental atherosclerosis and ectatic abdominal aorta. Jacqueline Hammond had surgery at [facility] by Dr Cindie Laroche on 10-29-15, which was robotic hysterectomy BSO with pelvic and paraaortic sentinel node evaluation. Intraoperative findings were of small uterus with normal tubes and ovaries. Adhesions of bladder due to uterine fundus from prior surgery, bilateral pelvic sentinel pelvic mapping to left obturator space node and to right external iliac node, 3 right paraaortic nodes mapped as sentinel nodes, normal upper abdominal survey. Pathology [phone] had high grade serous adenocarcinoma of uterus involving cervical stroma and outer half of myometrium, isolated tumor cells in 2/2 right external iliac and 2/2 left obturator nodes, negative right and left periaortics, bilateral tubes and ovaries benign. Immunohistochemical stains confirmed serous carcinoma, ER and PR negative. [facility] labs 10-27-15 (preoperative) included CA 125 of 12.1, WBC 10.5, Hgb 14.5 and platelets 290k. [facility] gyn oncology multidisciplinary conference 11-04-15 recommended 6 cycles carboplatin taxol given in sandwich fashion with pelvic radiation and vaginal brachytherapy. First carboplatin taxol given 11-20-15, leukopenic with counts not at nadir day 11 cycle 1, ANC 1.2, given granix x 1. Jacqueline Hammond did not have further gCSF until documented neutropenia day 15 cycle 3 with ANC 0.2. Pelvic IMRT was given in sandwich fashion after first 3 cycles of chemo, completed 02-23-16. Chemo resumed with cycle 4 carbo taxol on 03-03-16. HDR x 3 on 03-08-16, 03-17-16, 03-24-16.     Objective:  Vital signs in last 24 hours:  BP (!) 139/95 (BP Location: Right Arm, Patient Position: Sitting)   Pulse 94   Temp 98.3 F (36.8 C) (Oral)  Resp 18   Ht _0  (1.6 m)   Wt 128 lb 8 oz (58.3 kg)   SpO2 100%   BMI 22.76 kg/m  Weight down 2 lbs Alert, oriented and appropriate. Ambulatory without difficulty.  Alopecia  HEENT:PERRL, sclerae not icteric. Oral mucosa moist  without lesions, posterior pharynx clear.  Neck supple. No JVD.  Lymphatics:no cervical,supraclavicular or inguinal adenopathy Resp: clear to auscultation bilaterally and normal percussion bilaterally Cardio: regular rate and rhythm. No gallop. GI: soft, nontender, not distended, no mass or organomegaly. Normally active bowel sounds. Surgical incision not remarkable. Musculoskeletal/ Extremities: LE without pitting edema, cords, tenderness Neuro: no peripheral neuropathy. Otherwise nonfocal Skin without rash, ecchymosis, petechiae Breasts: without dominant mass, skin or nipple findings. Axillae benign. PICC right upper arm dressing intact, surrounding area without erythema or tenderness, no swelling.  Lab Results:  Results for orders placed or performed in visit on 03/14/16  CBC with Differential  Result Value Ref Range   WBC 1.9 (L) 3.9 - 10.3 10e3/uL   NEUT# 0.8 (L) 1.5 - 6.5 10e3/uL   HGB 11.2 (L) 11.6 - 15.9 g/dL   HCT 33.6 (L) 34.8 - 46.6 %   Platelets 131 Few Large platelets present (L) 145 - 400 10e3/uL   MCV 113.8 (H) 79.5 - 101.0 fL   MCH 37.9 (H) 25.1 - 34.0 pg   MCHC 33.3 31.5 - 36.0 g/dL   RBC 2.95 (L) 3.70 - 5.45 10e6/uL   RDW 14.0 11.2 - 14.5 %   lymph# 0.6 (L) 0.9 - 3.3 10e3/uL   MONO# 0.5 0.1 - 0.9 10e3/uL   Eosinophils Absolute 0.0 0.0 - 0.5 10e3/uL   Basophils Absolute 0.0 0.0 - 0.1 10e3/uL   NEUT% 39.9 38.4 - 76.8 %   LYMPH% 34.2 14.0 - 49.7 %   MONO% 24.0 (H) 0.0 - 14.0 %   EOS% 1.4 0.0 - 7.0 %   BASO% 0.5 0.0 - 2.0 %  Comprehensive metabolic panel  Result Value Ref Range   Sodium 140 136 - 145 mEq/L   Potassium 3.6 3.5 - 5.1 mEq/L   Chloride 106 98 - 109 mEq/L   CO2 27 22 - 29 mEq/L   Glucose 88 70 - 140 mg/dl   BUN 7.0 7.0 - 26.0 mg/dL   Creatinine 0.7 0.6 - 1.1 mg/dL   Total Bilirubin 0.31 0.20 - 1.20 mg/dL   Alkaline Phosphatase 85 40 - 150 U/L   AST 20 5 - 34 U/L   ALT 25 0 - 55 U/L   Total Protein 6.5 6.4 - 8.3 g/dL   Albumin 3.6 3.5 - 5.0  g/dL   Calcium 9.4 8.4 - 10.4 mg/dL   Anion Gap 7 3 - 11 mEq/L   EGFR >90 >90 ml/min/1.73 m2     Studies/Results:  No results found.  Medications: I have reviewed the patient's current medications. Additional granix given now. Recommended claritin for granix/ taxol aches.    DISCUSSION Will move treatment from 8-24 to 8-25 due to HDR on 8-24; will check labs on 8-24 to be sure ok to treat on 8-25. Jacqueline Hammond will need granix x 3 doses for last 2 planned chemo treatments, counts likely some lower now from cumulative chemo + pelvic radiation.  Neutropenic precautions reviewed.  Claritin as above.  Discussed logistics of bathing with the PICC (using saran wrap + duct tape!) Discussed DC PICC as soon as we can tell that Jacqueline Hammond is stable after last chemo, either day of chemo or with granix  injection 1-3 days after last chemo probably. Flushes Mon and Thurs and dressing changes weekly while in.  Patient is in agreement with continuing treatment as planned.  Assessment/Plan:  1.IIIC1 serous high grade endometrial carcinoma with cervical stromal involvement and isolated tumor cells identified in bilateral sentinel pelvic nodes: robotic hysterectomy BSO pelvic and periaortic node evaluation at [facility] 10-29-15, pelvic IMRT completed 02-23-16, adjuvant therapy continuing with chemotherapy in sandwich fashion cycle 5 03-25-16 as long as [place] >=1.5 and plt >=100k by labs 03-24-16.  Vaginal brachytherapy in process, treatments to be done 8-17 and 03-24-16.  2.Chemo neutropenia: will need gCSF with further carbo taxol, using granix x 3 doses . Neutropenic today but should recover quickly with additional granix now, day 12 cycle 4 today. Neutropenic precautions and call if any concerns next several days 3.long and ongoing tobacco abuse: Jacqueline Hammond has cut back to 1-3 cigarettes/ 24 hrs. Smoking cessation encouraged. 4. Overdue mammogram, last at [facility], need to address when present treatment completed 5. Four benign  colon polyps at colonoscopy by Dr Henrene Pastor 06-2008: repeat per GI 6.osteopenia by bone density scan 09-2015 7.elevated lipids followed by PCP 8.hx BTL 9.radiology evidence of atherosclerosis thoracic and abdominal, and ectatic abdominal aorta. Recommendation to follow up with [place] in 5 years, cc this information to PCP 10. mild leukocytosis and macrocytosis without anemia at consultation visit 11-13-15: mildly elevated WBC thought post op with no symptoms of infection, macrocytosis slightly increased from a year ago, B12 and folate normal 09-2014, denies ETOH. Follow 11.Constipation around initial chemo, recent radiation diarrhea resolved. Jacqueline Hammond has been more careful with oral hydration.  12.restless legs several days after chemo, history of similar problem in years past. Gatorade helpful 13. PICC placed by IR 03-02-16. Will DC either day of last chemo or with granix injection after last chemo, as soon as we can tell that Jacqueline Hammond does not need additional IVF.    All questions answered. Granix ordered, labs ordered for 8-24, collaborative RN notified to let patient know then if ok for premed decadron. Chemo and granix orders confirmed. Route PCP, cc Dr Sondra Come. Time spent 25 min includng >50% counseling and coordination of care.    Jacqueline Clines, MD   03/14/2016, 8:04 PM

## 2016-03-15 DIAGNOSIS — Z452 Encounter for adjustment and management of vascular access device: Secondary | ICD-10-CM | POA: Insufficient documentation

## 2016-03-16 ENCOUNTER — Telehealth: Payer: Self-pay | Admitting: *Deleted

## 2016-03-16 NOTE — Telephone Encounter (Signed)
CALLED PATIENT TO REMIND OF HDR [place] 03-17-16 @ 2 PM, LVM FOR A RETURN CALL

## 2016-03-17 ENCOUNTER — Ambulatory Visit
Admission: RE | Admit: 2016-03-17 | Discharge: 2016-03-17 | Disposition: A | Discharge status: Home / Self Care | Payer: BLUE CROSS/BLUE SHIELD | Source: Ambulatory Visit | Attending: Radiation Oncology | Admitting: Radiation Oncology

## 2016-03-17 ENCOUNTER — Other Ambulatory Visit: Payer: BLUE CROSS/BLUE SHIELD

## 2016-03-17 ENCOUNTER — Ambulatory Visit (HOSPITAL_BASED_OUTPATIENT_CLINIC_OR_DEPARTMENT_OTHER): Payer: BLUE CROSS/BLUE SHIELD

## 2016-03-17 ENCOUNTER — Ambulatory Visit: Payer: BLUE CROSS/BLUE SHIELD | Admitting: Oncology

## 2016-03-17 DIAGNOSIS — C541 Malignant neoplasm of endometrium: Secondary | ICD-10-CM | POA: Diagnosis not present

## 2016-03-17 DIAGNOSIS — Z452 Encounter for adjustment and management of vascular access device: Secondary | ICD-10-CM

## 2016-03-17 DIAGNOSIS — Z510 Encounter for antineoplastic radiation therapy: Secondary | ICD-10-CM | POA: Insufficient documentation

## 2016-03-17 DIAGNOSIS — Z95828 Presence of other vascular implants and grafts: Secondary | ICD-10-CM

## 2016-03-17 MED ORDER — HEPARIN SOD (PORK) LOCK FLUSH 100 UNIT/ML IV SOLN
500.0000 [IU] | Freq: Once | INTRAVENOUS | Status: AC | PRN
  Administered 2016-03-17: 500 [IU] via INTRAVENOUS
  Filled 2016-03-17: qty 5

## 2016-03-17 MED ORDER — SODIUM CHLORIDE 0.9 % IJ SOLN
10.0000 mL | INTRAMUSCULAR | Status: DC | PRN
  Administered 2016-03-17: 10 mL via INTRAVENOUS
  Filled 2016-03-17: qty 10

## 2016-03-17 NOTE — Patient Instructions (Signed)
PICC Home Guide A peripherally inserted central catheter (PICC) is a long, thin, flexible tube that is inserted into a vein in the upper arm. It is a form of intravenous (IV) access. It is considered to be a "central" line because the tip of the PICC ends in a large vein in your chest. This large vein is called the superior vena cava (SVC). The PICC tip ends in the SVC because there is a lot of blood flow in the SVC. This allows medicines and IV fluids to be quickly distributed throughout the body. The PICC is inserted using a sterile technique by a specially trained nurse or physician. After the PICC is inserted, a chest X-ray exam is done to be sure it is in the correct place.  A PICC may be placed for different reasons, such as:  To give medicines and liquid nutrition that can only be given through a central line. Examples are:  Certain antibiotic treatments.  Chemotherapy.  Total parenteral nutrition (TPN).  To take frequent blood samples.  To give IV fluids and blood products.  If there is difficulty placing a peripheral intravenous (PIV) catheter. If taken care of properly, a PICC can remain in place for several months. A PICC can also allow a person to go home from the hospital early. Medicine and PICC care can be managed at home by a family member or home health care team. WHAT PROBLEMS CAN HAPPEN WHEN I HAVE A PICC? Problems with a PICC can occasionally occur. These may include the following:  A blood clot (thrombus) forming in or at the tip of the PICC. This can cause the PICC to become clogged. A clot-dissolving medicine called tissue plasminogen activator (tPA) can be given through the PICC to help break up the clot.  Inflammation of the vein (phlebitis) in which the PICC is placed. Signs of inflammation may include redness, pain at the insertion site, red streaks, or being able to feel a "cord" in the vein where the PICC is located.  Infection in the PICC or at the insertion  site. Signs of infection may include fever, chills, redness, swelling, or pus drainage from the PICC insertion site.  PICC movement (malposition). The PICC tip may move from its original position due to excessive physical activity, forceful coughing, sneezing, or vomiting.  A break or cut in the PICC. It is important to not use scissors near the PICC.  Nerve or tendon irritation or injury during PICC insertion. WHAT SHOULD I KEEP IN MIND ABOUT ACTIVITIES WHEN I HAVE A PICC?  You may bend your arm and move it freely. If your PICC is near or at the bend of your elbow, avoid activity with repeated motion at the elbow.  Rest at home for the remainder of the day following PICC line insertion.  Avoid lifting heavy objects as instructed by your health care provider.  Avoid using a crutch with the arm on the same side as your PICC. You may need to use a walker. WHAT SHOULD I KNOW ABOUT MY PICC DRESSING?  Keep your PICC bandage (dressing) clean and dry to prevent infection.  Ask your health care provider when you may shower. Ask your health care provider to teach you how to wrap the PICC when you do take a shower.  Change the PICC dressing as instructed by your health care provider.  Change your PICC dressing if it becomes loose or wet. WHAT SHOULD I KNOW ABOUT PICC CARE?  Check the PICC insertion site   daily for leakage, redness, swelling, or pain.  Do not take a bath, swim, or use hot tubs when you have a PICC. Cover PICC line with clear plastic wrap and tape to keep it dry while showering.  Flush the PICC as directed by your health care provider. Let your health care provider know right away if the PICC is difficult to flush or does not flush. Do not use force to flush the PICC.  Do not use a syringe that is less than 10 mL to flush the PICC.  Never pull or tug on the PICC.  Avoid blood pressure checks on the arm with the PICC.  Keep your PICC identification card with you at all  times.  Do not take the PICC out yourself. Only a trained clinical professional should remove the PICC. SEEK IMMEDIATE MEDICAL CARE IF:  Your PICC is accidentally pulled all the way out. If this happens, cover the insertion site with a bandage or gauze dressing. Do not throw the PICC away. Your health care provider will need to inspect it.  Your PICC was tugged or pulled and has partially come out. Do not  push the PICC back in.  There is any type of drainage, redness, or swelling where the PICC enters the skin.  You cannot flush the PICC, it is difficult to flush, or the PICC leaks around the insertion site when it is flushed.  You hear a "flushing" sound when the PICC is flushed.  You have pain, discomfort, or numbness in your arm, shoulder, or jaw on the same side as the PICC.  You feel your heart "racing" or skipping beats.  You notice a hole or tear in the PICC.  You develop chills or a fever. MAKE SURE YOU:   Understand these instructions.  Will watch your condition.  Will get help right away if you are not doing well or get worse.   This information is not intended to replace advice given to you by your health care provider. Make sure you discuss any questions you have with your health care provider.   Document Released: 01/22/2003 Document Revised: 08/08/2014 Document Reviewed: 03/25/2013 Elsevier Interactive Patient Education 2016 [employer]  

## 2016-03-17 NOTE — Progress Notes (Signed)
  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 She will be treated with a 3 cm diameter segmented cylinder. This conforms to her anatomy without undue discomfort.  Vaginal brachytherapy procedure node: The patient was brought to the [place] suite. Identity was confirmed. All relevant records and images related to the planned course of therapy were reviewed. The patient freely provided informed written consent to proceed with treatment after reviewing the details related to the planned course of therapy. The consent form was witnessed and verified by the simulation staff. Then, the patient was set-up in a stable reproducible supine position for radiation therapy. Vaginal cuff was noted to be intact. The patient's custom vaginal cylinder was placed in the proximal vagina. This was affixed to the CT/MR stabilization plate to prevent slippage. Patient tolerated the placement well.  Verification simulation note:  A fiducial marker was placed within the vaginal cylinder. An AP and lateral film was then obtained through the pelvis area. This documented accurate position of the vaginal cylinder for treatment.  HDR BRACHYTHERAPY TREATMENT  The remote afterloading device was affixed to the vaginal cylinder by catheter. Patient then proceeded to undergo her second high-dose-rate treatment directed at the proximal vagina. The patient was prescribed a dose of 6 gray to be delivered to the mucosal surface. Treatment length was 3 cm. Patient was treated with 1 channel using 7 dwell positions. Treatment time was 255.5 seconds. Iridium 192 was the high-dose-rate source for treatment.  The patient tolerated the treatment well. After completion of her therapy, a radiation survey was performed documenting return of the iridium source into the GammaMed safe.   PLAN: The patient will return for her third HDR treatment on 03/24/16. ________________________________  Blair Promise, PhD, MD   This document serves as a record of services personally performed by Gery Pray, MD. It was created on his behalf by Darcus Austin, a trained medical scribe. The creation of this record is based on the scribe's personal observations and the provider's statements to them. This document has been checked and approved by the attending provider.

## 2016-03-21 ENCOUNTER — Ambulatory Visit (HOSPITAL_BASED_OUTPATIENT_CLINIC_OR_DEPARTMENT_OTHER): Payer: BLUE CROSS/BLUE SHIELD

## 2016-03-21 DIAGNOSIS — C541 Malignant neoplasm of endometrium: Secondary | ICD-10-CM

## 2016-03-21 DIAGNOSIS — Z95828 Presence of other vascular implants and grafts: Secondary | ICD-10-CM

## 2016-03-21 DIAGNOSIS — Z452 Encounter for adjustment and management of vascular access device: Secondary | ICD-10-CM | POA: Diagnosis not present

## 2016-03-21 MED ORDER — SODIUM CHLORIDE 0.9 % IJ SOLN
10.0000 mL | INTRAMUSCULAR | Status: DC | PRN
  Administered 2016-03-21: 10 mL via INTRAVENOUS
  Filled 2016-03-21: qty 10

## 2016-03-21 MED ORDER — HEPARIN SOD (PORK) LOCK FLUSH 100 UNIT/ML IV SOLN
500.0000 [IU] | Freq: Once | INTRAVENOUS | Status: AC | PRN
  Administered 2016-03-21: 250 [IU] via INTRAVENOUS
  Filled 2016-03-21: qty 5

## 2016-03-21 NOTE — Patient Instructions (Signed)
PICC Home Guide A peripherally inserted central catheter (PICC) is a long, thin, flexible tube that is inserted into a vein in the upper arm. It is a form of intravenous (IV) access. It is considered to be a "central" line because the tip of the PICC ends in a large vein in your chest. This large vein is called the superior vena cava (SVC). The PICC tip ends in the SVC because there is a lot of blood flow in the SVC. This allows medicines and IV fluids to be quickly distributed throughout the body. The PICC is inserted using a sterile technique by a specially trained nurse or physician. After the PICC is inserted, a chest X-ray exam is done to be sure it is in the correct place.  A PICC may be placed for different reasons, such as:  To give medicines and liquid nutrition that can only be given through a central line. Examples are:  Certain antibiotic treatments.  Chemotherapy.  Total parenteral nutrition (TPN).  To take frequent blood samples.  To give IV fluids and blood products.  If there is difficulty placing a peripheral intravenous (PIV) catheter. If taken care of properly, a PICC can remain in place for several months. A PICC can also allow a person to go home from the hospital early. Medicine and PICC care can be managed at home by a family member or home health care team. WHAT PROBLEMS CAN HAPPEN WHEN I HAVE A PICC? Problems with a PICC can occasionally occur. These may include the following:  A blood clot (thrombus) forming in or at the tip of the PICC. This can cause the PICC to become clogged. A clot-dissolving medicine called tissue plasminogen activator (tPA) can be given through the PICC to help break up the clot.  Inflammation of the vein (phlebitis) in which the PICC is placed. Signs of inflammation may include redness, pain at the insertion site, red streaks, or being able to feel a "cord" in the vein where the PICC is located.  Infection in the PICC or at the insertion  site. Signs of infection may include fever, chills, redness, swelling, or pus drainage from the PICC insertion site.  PICC movement (malposition). The PICC tip may move from its original position due to excessive physical activity, forceful coughing, sneezing, or vomiting.  A break or cut in the PICC. It is important to not use scissors near the PICC.  Nerve or tendon irritation or injury during PICC insertion. WHAT SHOULD I KEEP IN MIND ABOUT ACTIVITIES WHEN I HAVE A PICC?  You may bend your arm and move it freely. If your PICC is near or at the bend of your elbow, avoid activity with repeated motion at the elbow.  Rest at home for the remainder of the day following PICC line insertion.  Avoid lifting heavy objects as instructed by your health care provider.  Avoid using a crutch with the arm on the same side as your PICC. You may need to use a walker. WHAT SHOULD I KNOW ABOUT MY PICC DRESSING?  Keep your PICC bandage (dressing) clean and dry to prevent infection.  Ask your health care provider when you may shower. Ask your health care provider to teach you how to wrap the PICC when you do take a shower.  Change the PICC dressing as instructed by your health care provider.  Change your PICC dressing if it becomes loose or wet. WHAT SHOULD I KNOW ABOUT PICC CARE?  Check the PICC insertion site   daily for leakage, redness, swelling, or pain.  Do not take a bath, swim, or use hot tubs when you have a PICC. Cover PICC line with clear plastic wrap and tape to keep it dry while showering.  Flush the PICC as directed by your health care provider. Let your health care provider know right away if the PICC is difficult to flush or does not flush. Do not use force to flush the PICC.  Do not use a syringe that is less than 10 mL to flush the PICC.  Never pull or tug on the PICC.  Avoid blood pressure checks on the arm with the PICC.  Keep your PICC identification card with you at all  times.  Do not take the PICC out yourself. Only a trained clinical professional should remove the PICC. SEEK IMMEDIATE MEDICAL CARE IF:  Your PICC is accidentally pulled all the way out. If this happens, cover the insertion site with a bandage or gauze dressing. Do not throw the PICC away. Your health care provider will need to inspect it.  Your PICC was tugged or pulled and has partially come out. Do not  push the PICC back in.  There is any type of drainage, redness, or swelling where the PICC enters the skin.  You cannot flush the PICC, it is difficult to flush, or the PICC leaks around the insertion site when it is flushed.  You hear a "flushing" sound when the PICC is flushed.  You have pain, discomfort, or numbness in your arm, shoulder, or jaw on the same side as the PICC.  You feel your heart "racing" or skipping beats.  You notice a hole or tear in the PICC.  You develop chills or a fever. MAKE SURE YOU:   Understand these instructions.  Will watch your condition.  Will get help right away if you are not doing well or get worse.   This information is not intended to replace advice given to you by your health care provider. Make sure you discuss any questions you have with your health care provider.   Document Released: 01/22/2003 Document Revised: 08/08/2014 Document Reviewed: 03/25/2013 Elsevier Interactive Patient Education 2016 [employer]  

## 2016-03-24 ENCOUNTER — Telehealth: Payer: Self-pay

## 2016-03-24 ENCOUNTER — Ambulatory Visit: Payer: BLUE CROSS/BLUE SHIELD

## 2016-03-24 ENCOUNTER — Other Ambulatory Visit (HOSPITAL_BASED_OUTPATIENT_CLINIC_OR_DEPARTMENT_OTHER): Payer: BLUE CROSS/BLUE SHIELD

## 2016-03-24 ENCOUNTER — Other Ambulatory Visit: Payer: Self-pay | Admitting: Oncology

## 2016-03-24 ENCOUNTER — Encounter: Payer: Self-pay | Admitting: Oncology

## 2016-03-24 ENCOUNTER — Ambulatory Visit
Admission: RE | Admit: 2016-03-24 | Discharge: 2016-03-24 | Disposition: A | Discharge status: Home / Self Care | Payer: BLUE CROSS/BLUE SHIELD | Source: Ambulatory Visit | Attending: Radiation Oncology | Admitting: Radiation Oncology

## 2016-03-24 ENCOUNTER — Encounter: Payer: Self-pay | Admitting: Radiation Oncology

## 2016-03-24 DIAGNOSIS — C541 Malignant neoplasm of endometrium: Secondary | ICD-10-CM

## 2016-03-24 DIAGNOSIS — Z95828 Presence of other vascular implants and grafts: Secondary | ICD-10-CM

## 2016-03-24 DIAGNOSIS — Z510 Encounter for antineoplastic radiation therapy: Secondary | ICD-10-CM | POA: Diagnosis not present

## 2016-03-24 LAB — CBC WITH DIFFERENTIAL/PLATELET
BASO%: 0.2 % (ref 0.0–2.0)
Basophils Absolute: 0 10*3/uL (ref 0.0–0.1)
EOS ABS: 0 10*3/uL (ref 0.0–0.5)
EOS%: 0.4 % (ref 0.0–7.0)
HCT: 32.5 % — ABNORMAL LOW (ref 34.8–46.6)
HEMOGLOBIN: 11.1 g/dL — AB (ref 11.6–15.9)
LYMPH%: 22.2 % (ref 14.0–49.7)
MCH: 37.8 pg — ABNORMAL HIGH (ref 25.1–34.0)
MCHC: 34.2 g/dL (ref 31.5–36.0)
MCV: 110.5 fL — AB (ref 79.5–101.0)
MONO#: 0.5 10*3/uL (ref 0.1–0.9)
MONO%: 8.4 % (ref 0.0–14.0)
NEUT%: 68.8 % (ref 38.4–76.8)
NEUTROS ABS: 3.7 10*3/uL (ref 1.5–6.5)
PLATELETS: 72 10*3/uL — AB (ref 145–400)
RBC: 2.94 10*6/uL — ABNORMAL LOW (ref 3.70–5.45)
RDW: 13.2 % (ref 11.2–14.5)
WBC: 5.4 10*3/uL (ref 3.9–10.3)
lymph#: 1.2 10*3/uL (ref 0.9–3.3)

## 2016-03-24 LAB — COMPREHENSIVE METABOLIC PANEL
ALBUMIN: 3.7 g/dL (ref 3.5–5.0)
ALK PHOS: 86 U/L (ref 40–150)
ALT: 16 U/L (ref 0–55)
AST: 17 U/L (ref 5–34)
Anion Gap: 8 mEq/L (ref 3–11)
BUN: 9.1 mg/dL (ref 7.0–26.0)
CALCIUM: 9.6 mg/dL (ref 8.4–10.4)
CO2: 25 mEq/L (ref 22–29)
Chloride: 106 mEq/L (ref 98–109)
Creatinine: 0.7 mg/dL (ref 0.6–1.1)
EGFR: 86 mL/min/{1.73_m2} — ABNORMAL LOW (ref >90)
Glucose: 84 mg/dl (ref 70–140)
POTASSIUM: 3.8 meq/L (ref 3.5–5.1)
Sodium: 140 mEq/L (ref 136–145)
TOTAL PROTEIN: 6.7 g/dL (ref 6.4–8.3)

## 2016-03-24 MED ORDER — HEPARIN SOD (PORK) LOCK FLUSH 100 UNIT/ML IV SOLN
500.0000 [IU] | Freq: Once | INTRAVENOUS | Status: AC | PRN
  Administered 2016-03-24: 500 [IU] via INTRAVENOUS
  Filled 2016-03-24: qty 5

## 2016-03-24 MED ORDER — SODIUM CHLORIDE 0.9 % IJ SOLN
10.0000 mL | INTRAMUSCULAR | Status: DC | PRN
  Administered 2016-03-24: 10 mL via INTRAVENOUS
  Filled 2016-03-24: qty 10

## 2016-03-24 NOTE — Telephone Encounter (Signed)
-----   Message from Gordy Levan, MD sent at 03/24/2016  6:10 PM EDT ----- Chemo delayed from 8-25 due to platelets 70k  CBC CMET ordered from PICC 8-31. Need to let patient know if ok for treatment on 9-1   ([place] >=1.5 and plt >=100k).  Thank you

## 2016-03-24 NOTE — Progress Notes (Signed)
  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 She will be treated with a 3 cm diameter segmented cylinder. This conforms to her anatomy without undue discomfort.  Vaginal brachytherapy procedure node: The patient was brought to the [place] suite. Identity was confirmed. All relevant records and images related to the planned course of therapy were reviewed. The patient freely provided informed written consent to proceed with treatment after reviewing the details related to the planned course of therapy. The consent form was witnessed and verified by the simulation staff. Then, the patient was set-up in a stable reproducible supine position for radiation therapy. Vaginal cuff was noted to be intact. The patient's custom vaginal cylinder was placed in the proximal vagina. This was affixed to the CT/MR stabilization plate to prevent slippage. Patient tolerated the placement well.  Verification simulation note:  A fiducial marker was placed within the vaginal cylinder. An AP and lateral film was then obtained through the pelvis area. This documented accurate position of the vaginal cylinder for treatment.  HDR BRACHYTHERAPY TREATMENT  The remote afterloading device was affixed to the vaginal cylinder by catheter. Patient then proceeded to undergo her third high-dose-rate treatment directed at the proximal vagina. The patient was prescribed a dose of 6 gray to be delivered to the mucosal surface. Treatment length was 3 cm. Patient was treated with 1 channel using 7 dwell positions. Treatment time was 272.9 seconds. Iridium 192 was the high-dose-rate source for treatment. The  patient tolerated the treatment well. After completion of her therapy, a radiation survey was performed documenting return of the iridium source into the GammaMed safe.   PLAN: The patient completed HDR vaginal brachytherapy treatment. She will return to radiation oncology for a follow up in 1 month. ________________________________  Blair Promise, PhD, MD   This document serves as a record of services personally performed by Gery Pray, MD. It was created on his behalf by Darcus Austin, a trained medical scribe. The creation of this record is based on the scribe's personal observations and the provider's statements to them. This document has been checked and approved by the attending provider.

## 2016-03-24 NOTE — Progress Notes (Signed)
Medical Oncology  Platelets 72k 03-24-16 so will delay chemo at least a week. Scheduling message sent (copied below), LVM for infusion scheduler to cancel chemo 8-25.  "cancel treatment 8-25 cancel inj 8-26,28,29 add lab to PICC flush 8-31 long taxol carbo 9-1 injections 9-2,4,5 Cancel chemo 9-14 cancel inj 9-15,16,18 Keep PICC flushes every Mon and THurs Keep LL 9-7"  Carbo dose decreased to AUC =5 due to delay. Message to RN to let patient know labs 8-31 re chemo on 9-1  Peggye Fothergill, MD

## 2016-03-24 NOTE — Telephone Encounter (Signed)
Told Mr  Arana that Ms Stukes's platelet count is too low for treatment tomorrow as noted below by Dr. Marko Plume. Injection appointments cancelled as well for this week and next. Keep PICC flush appointment as scheduled on 03-28-16.  Will have labs done on8-31-17 to see if counts are ok for treatment 04-01-16.  Scheduling will need to adjust appointments  Mr. Einspahr verbalized understanding.

## 2016-03-25 ENCOUNTER — Ambulatory Visit: Payer: BLUE CROSS/BLUE SHIELD

## 2016-03-26 ENCOUNTER — Ambulatory Visit: Payer: BLUE CROSS/BLUE SHIELD

## 2016-03-28 ENCOUNTER — Ambulatory Visit: Payer: BLUE CROSS/BLUE SHIELD

## 2016-03-28 ENCOUNTER — Ambulatory Visit (HOSPITAL_BASED_OUTPATIENT_CLINIC_OR_DEPARTMENT_OTHER): Payer: BLUE CROSS/BLUE SHIELD

## 2016-03-28 DIAGNOSIS — Z452 Encounter for adjustment and management of vascular access device: Secondary | ICD-10-CM | POA: Diagnosis not present

## 2016-03-28 DIAGNOSIS — Z510 Encounter for antineoplastic radiation therapy: Secondary | ICD-10-CM | POA: Diagnosis not present

## 2016-03-28 DIAGNOSIS — C541 Malignant neoplasm of endometrium: Secondary | ICD-10-CM

## 2016-03-28 DIAGNOSIS — Z95828 Presence of other vascular implants and grafts: Secondary | ICD-10-CM

## 2016-03-28 MED ORDER — SODIUM CHLORIDE 0.9 % IJ SOLN
10.0000 mL | INTRAMUSCULAR | Status: DC | PRN
  Administered 2016-03-28: 10 mL via INTRAVENOUS
  Filled 2016-03-28: qty 10

## 2016-03-28 MED ORDER — HEPARIN SOD (PORK) LOCK FLUSH 100 UNIT/ML IV SOLN
500.0000 [IU] | Freq: Once | INTRAVENOUS | Status: AC | PRN
  Administered 2016-03-28: 500 [IU] via INTRAVENOUS
  Filled 2016-03-28: qty 5

## 2016-03-29 ENCOUNTER — Ambulatory Visit: Payer: BLUE CROSS/BLUE SHIELD

## 2016-03-31 ENCOUNTER — Telehealth: Payer: Self-pay

## 2016-03-31 ENCOUNTER — Ambulatory Visit: Payer: BLUE CROSS/BLUE SHIELD

## 2016-03-31 ENCOUNTER — Other Ambulatory Visit: Payer: Self-pay | Admitting: Oncology

## 2016-03-31 ENCOUNTER — Other Ambulatory Visit (HOSPITAL_BASED_OUTPATIENT_CLINIC_OR_DEPARTMENT_OTHER): Payer: BLUE CROSS/BLUE SHIELD

## 2016-03-31 DIAGNOSIS — C541 Malignant neoplasm of endometrium: Secondary | ICD-10-CM

## 2016-03-31 DIAGNOSIS — Z510 Encounter for antineoplastic radiation therapy: Secondary | ICD-10-CM | POA: Diagnosis not present

## 2016-03-31 DIAGNOSIS — Z95828 Presence of other vascular implants and grafts: Secondary | ICD-10-CM

## 2016-03-31 LAB — CBC WITH DIFFERENTIAL/PLATELET
BASO%: 0.3 % (ref 0.0–2.0)
Basophils Absolute: 0 10*3/uL (ref 0.0–0.1)
EOS ABS: 0 10*3/uL (ref 0.0–0.5)
EOS%: 1.1 % (ref 0.0–7.0)
HEMATOCRIT: 30.6 % — AB (ref 34.8–46.6)
HEMOGLOBIN: 10.5 g/dL — AB (ref 11.6–15.9)
LYMPH#: 1.1 10*3/uL (ref 0.9–3.3)
LYMPH%: 29.6 % (ref 14.0–49.7)
MCH: 38.2 pg — ABNORMAL HIGH (ref 25.1–34.0)
MCHC: 34.3 g/dL (ref 31.5–36.0)
MCV: 111.3 fL — AB (ref 79.5–101.0)
MONO#: 0.4 10*3/uL (ref 0.1–0.9)
MONO%: 11 % (ref 0.0–14.0)
NEUT%: 58 % (ref 38.4–76.8)
NEUTROS ABS: 2.2 10*3/uL (ref 1.5–6.5)
PLATELETS: 99 10*3/uL — AB (ref 145–400)
RBC: 2.75 10*6/uL — AB (ref 3.70–5.45)
RDW: 13.4 % (ref 11.2–14.5)
WBC: 3.7 10*3/uL — AB (ref 3.9–10.3)

## 2016-03-31 LAB — COMPREHENSIVE METABOLIC PANEL
ALBUMIN: 3.6 g/dL (ref 3.5–5.0)
ALK PHOS: 76 U/L (ref 40–150)
ALT: 10 U/L (ref 0–55)
ANION GAP: 10 meq/L (ref 3–11)
AST: 16 U/L (ref 5–34)
BUN: 7.8 mg/dL (ref 7.0–26.0)
CO2: 24 meq/L (ref 22–29)
CREATININE: 0.8 mg/dL (ref 0.6–1.1)
Calcium: 9.3 mg/dL (ref 8.4–10.4)
Chloride: 107 mEq/L (ref 98–109)
EGFR: 79 mL/min/{1.73_m2} — ABNORMAL LOW (ref >90)
Glucose: 87 mg/dl (ref 70–140)
Potassium: 3.7 mEq/L (ref 3.5–5.1)
Sodium: 141 mEq/L (ref 136–145)
TOTAL PROTEIN: 6.5 g/dL (ref 6.4–8.3)

## 2016-03-31 MED ORDER — HEPARIN SOD (PORK) LOCK FLUSH 100 UNIT/ML IV SOLN
500.0000 [IU] | Freq: Once | INTRAVENOUS | Status: AC | PRN
  Administered 2016-03-31: 500 [IU] via INTRAVENOUS
  Filled 2016-03-31: qty 5

## 2016-03-31 MED ORDER — SODIUM CHLORIDE 0.9 % IJ SOLN
10.0000 mL | INTRAMUSCULAR | Status: DC | PRN
  Administered 2016-03-31: 10 mL via INTRAVENOUS
  Filled 2016-03-31: qty 10

## 2016-03-31 NOTE — Telephone Encounter (Signed)
Platelet count is 99. S/w Dr Marko Plume and will proceed with chemo tomorrow with decreased dose of chemo. Pt aware and glad to be moving forward with treatment. She states she feels good.

## 2016-03-31 NOTE — Telephone Encounter (Signed)
-----   Message from Gordy Levan, MD sent at 03/24/2016  6:10 PM EDT ----- Chemo delayed from 8-25 due to platelets 70k  CBC CMET ordered from PICC 8-31. Need to let patient know if ok for treatment on 9-1   ([place] >=1.5 and plt >=100k).  Thank you

## 2016-04-01 ENCOUNTER — Ambulatory Visit (HOSPITAL_BASED_OUTPATIENT_CLINIC_OR_DEPARTMENT_OTHER): Payer: BLUE CROSS/BLUE SHIELD

## 2016-04-01 ENCOUNTER — Other Ambulatory Visit: Payer: Self-pay | Admitting: Medical Oncology

## 2016-04-01 VITALS — BP 130/82 | HR 96 | Temp 98.1°F | Resp 16

## 2016-04-01 DIAGNOSIS — C541 Malignant neoplasm of endometrium: Secondary | ICD-10-CM

## 2016-04-01 DIAGNOSIS — Z5111 Encounter for antineoplastic chemotherapy: Secondary | ICD-10-CM

## 2016-04-01 MED ORDER — FAMOTIDINE IN NACL 20-0.9 MG/50ML-% IV SOLN
INTRAVENOUS | Status: AC
  Filled 2016-04-01: qty 50

## 2016-04-01 MED ORDER — SODIUM CHLORIDE 0.9 % IV SOLN
450.0000 mg | Freq: Once | INTRAVENOUS | Status: AC
  Administered 2016-04-01: 450 mg via INTRAVENOUS
  Filled 2016-04-01: qty 45

## 2016-04-01 MED ORDER — SODIUM CHLORIDE 0.9 % IV SOLN
Freq: Once | INTRAVENOUS | Status: AC
  Administered 2016-04-01: 13:00:00 via INTRAVENOUS
  Filled 2016-04-01: qty 5

## 2016-04-01 MED ORDER — HEPARIN SOD (PORK) LOCK FLUSH 100 UNIT/ML IV SOLN
250.0000 [IU] | Freq: Once | INTRAVENOUS | Status: AC | PRN
  Administered 2016-04-01: 250 [IU]
  Filled 2016-04-01: qty 5

## 2016-04-01 MED ORDER — SODIUM CHLORIDE 0.9 % IV SOLN
Freq: Once | INTRAVENOUS | Status: AC
  Administered 2016-04-01: 13:00:00 via INTRAVENOUS
  Filled 2016-04-01: qty 4

## 2016-04-01 MED ORDER — DIPHENHYDRAMINE HCL 50 MG/ML IJ SOLN
INTRAMUSCULAR | Status: AC
  Filled 2016-04-01: qty 1

## 2016-04-01 MED ORDER — FAMOTIDINE IN NACL 20-0.9 MG/50ML-% IV SOLN
20.0000 mg | Freq: Once | INTRAVENOUS | Status: AC
  Administered 2016-04-01: 20 mg via INTRAVENOUS

## 2016-04-01 MED ORDER — SODIUM CHLORIDE 0.9 % IV SOLN
135.0000 mg/m2 | Freq: Once | INTRAVENOUS | Status: AC
  Administered 2016-04-01: 228 mg via INTRAVENOUS
  Filled 2016-04-01: qty 38

## 2016-04-01 MED ORDER — SODIUM CHLORIDE 0.9 % IV SOLN
Freq: Once | INTRAVENOUS | Status: AC
  Administered 2016-04-01: 12:00:00 via INTRAVENOUS

## 2016-04-01 MED ORDER — SODIUM CHLORIDE 0.9% FLUSH
10.0000 mL | INTRAVENOUS | Status: DC | PRN
  Administered 2016-04-01: 10 mL
  Filled 2016-04-01: qty 10

## 2016-04-01 MED ORDER — SODIUM CHLORIDE 0.9 % IV SOLN: 382.8000 mg | Freq: Once | INTRAVENOUS | Status: DC

## 2016-04-01 MED ORDER — DIPHENHYDRAMINE HCL 50 MG/ML IJ SOLN
25.0000 mg | Freq: Once | INTRAMUSCULAR | Status: AC
  Administered 2016-04-01: 25 mg via INTRAVENOUS

## 2016-04-01 NOTE — Progress Notes (Incomplete)
°  Radiation Oncology         [phone] ________________________________  Name: Jacqueline Hammond MRN: [id number]  Date: 03/24/2016  DOB: 09-05-1950  End of Treatment Note  DIAGNOSIS:    ICD-9-CM ICD-10-CM   1. 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   ***  Plan: The patient has completed radiation treatment. The patient will return to radiation oncology clinic for routine followup in one month. I advised them to call or return sooner if they have any questions or concerns related to their recovery or treatment.  -----------------------------------  Blair Promise, PhD, MD

## 2016-04-01 NOTE — Patient Instructions (Signed)
[facility] Discharge Instructions for Patients Receiving Chemotherapy  Today you received the following chemotherapy agents Taxol and Carboplatin. To help prevent nausea and vomiting after your treatment, we encourage you to take your nausea medication as directed.  If you develop nausea and vomiting that is not controlled by your nausea medication, call the clinic.   BELOW ARE SYMPTOMS THAT SHOULD BE REPORTED IMMEDIATELY:  *FEVER GREATER THAN 100.5 F  *CHILLS WITH OR WITHOUT FEVER  NAUSEA AND VOMITING THAT IS NOT CONTROLLED WITH YOUR NAUSEA MEDICATION  *UNUSUAL SHORTNESS OF BREATH  *UNUSUAL BRUISING OR BLEEDING  TENDERNESS IN MOUTH AND THROAT WITH OR WITHOUT PRESENCE OF ULCERS  *URINARY PROBLEMS  *BOWEL PROBLEMS  UNUSUAL RASH Items with * indicate a potential emergency and should be followed up as soon as possible.  Feel free to call the clinic you have any questions or concerns. The clinic phone number is [phone].  Please show the CHEMO ALERT CARD at check-in to the Emergency Department and triage nurse.    

## 2016-04-02 ENCOUNTER — Ambulatory Visit (HOSPITAL_BASED_OUTPATIENT_CLINIC_OR_DEPARTMENT_OTHER): Payer: BLUE CROSS/BLUE SHIELD

## 2016-04-02 VITALS — BP 143/70 | HR 78 | Temp 98.3°F | Resp 16

## 2016-04-02 DIAGNOSIS — Z95828 Presence of other vascular implants and grafts: Secondary | ICD-10-CM

## 2016-04-02 DIAGNOSIS — C541 Malignant neoplasm of endometrium: Secondary | ICD-10-CM | POA: Diagnosis not present

## 2016-04-02 DIAGNOSIS — D701 Agranulocytosis secondary to cancer chemotherapy: Secondary | ICD-10-CM

## 2016-04-02 MED ORDER — TBO-FILGRASTIM 300 MCG/0.5ML ~~LOC~~ SOSY
300.0000 ug | PREFILLED_SYRINGE | Freq: Once | SUBCUTANEOUS | Status: AC
  Administered 2016-04-02: 300 ug via SUBCUTANEOUS

## 2016-04-02 MED ORDER — ALTEPLASE 2 MG IJ SOLR
2.0000 mg | Freq: Once | INTRAMUSCULAR | Status: DC | PRN
  Filled 2016-04-02: qty 2

## 2016-04-02 NOTE — Patient Instructions (Signed)

## 2016-04-04 ENCOUNTER — Other Ambulatory Visit: Payer: Self-pay | Admitting: Oncology

## 2016-04-05 ENCOUNTER — Ambulatory Visit (HOSPITAL_BASED_OUTPATIENT_CLINIC_OR_DEPARTMENT_OTHER): Payer: BLUE CROSS/BLUE SHIELD

## 2016-04-05 VITALS — BP 144/88 | HR 105 | Temp 98.0°F | Resp 18

## 2016-04-05 DIAGNOSIS — C541 Malignant neoplasm of endometrium: Secondary | ICD-10-CM

## 2016-04-05 DIAGNOSIS — Z5189 Encounter for other specified aftercare: Secondary | ICD-10-CM | POA: Diagnosis not present

## 2016-04-05 DIAGNOSIS — Z95828 Presence of other vascular implants and grafts: Secondary | ICD-10-CM

## 2016-04-05 MED ORDER — TBO-FILGRASTIM 300 MCG/0.5ML ~~LOC~~ SOSY
300.0000 ug | PREFILLED_SYRINGE | Freq: Once | SUBCUTANEOUS | Status: AC
  Administered 2016-04-05: 300 ug via SUBCUTANEOUS
  Filled 2016-04-05: qty 0.5

## 2016-04-05 NOTE — Patient Instructions (Signed)

## 2016-04-06 ENCOUNTER — Ambulatory Visit (HOSPITAL_BASED_OUTPATIENT_CLINIC_OR_DEPARTMENT_OTHER): Payer: BLUE CROSS/BLUE SHIELD

## 2016-04-06 VITALS — BP 133/82 | HR 95 | Temp 97.4°F | Resp 20

## 2016-04-06 DIAGNOSIS — Z95828 Presence of other vascular implants and grafts: Secondary | ICD-10-CM

## 2016-04-06 DIAGNOSIS — C541 Malignant neoplasm of endometrium: Secondary | ICD-10-CM | POA: Diagnosis not present

## 2016-04-06 DIAGNOSIS — Z5189 Encounter for other specified aftercare: Secondary | ICD-10-CM

## 2016-04-06 MED ORDER — TBO-FILGRASTIM 300 MCG/0.5ML ~~LOC~~ SOSY
300.0000 ug | PREFILLED_SYRINGE | Freq: Once | SUBCUTANEOUS | Status: AC
  Administered 2016-04-06: 300 ug via SUBCUTANEOUS
  Filled 2016-04-06: qty 0.5

## 2016-04-06 NOTE — Patient Instructions (Signed)

## 2016-04-07 ENCOUNTER — Ambulatory Visit (HOSPITAL_BASED_OUTPATIENT_CLINIC_OR_DEPARTMENT_OTHER): Payer: BLUE CROSS/BLUE SHIELD | Admitting: Oncology

## 2016-04-07 ENCOUNTER — Ambulatory Visit: Payer: BLUE CROSS/BLUE SHIELD | Admitting: Nurse Practitioner

## 2016-04-07 ENCOUNTER — Telehealth: Payer: Self-pay

## 2016-04-07 ENCOUNTER — Encounter: Payer: Self-pay | Admitting: Oncology

## 2016-04-07 ENCOUNTER — Other Ambulatory Visit (HOSPITAL_BASED_OUTPATIENT_CLINIC_OR_DEPARTMENT_OTHER): Payer: BLUE CROSS/BLUE SHIELD

## 2016-04-07 VITALS — BP 112/95 | HR 117 | Temp 97.8°F | Resp 18 | Ht 63.0 in | Wt 126.8 lb

## 2016-04-07 DIAGNOSIS — D701 Agranulocytosis secondary to cancer chemotherapy: Secondary | ICD-10-CM

## 2016-04-07 DIAGNOSIS — C7982 Secondary malignant neoplasm of genital organs: Secondary | ICD-10-CM

## 2016-04-07 DIAGNOSIS — Z720 Tobacco use: Secondary | ICD-10-CM

## 2016-04-07 DIAGNOSIS — C541 Malignant neoplasm of endometrium: Secondary | ICD-10-CM

## 2016-04-07 DIAGNOSIS — T451X5A Adverse effect of antineoplastic and immunosuppressive drugs, initial encounter: Secondary | ICD-10-CM

## 2016-04-07 DIAGNOSIS — G620 Drug-induced polyneuropathy: Secondary | ICD-10-CM

## 2016-04-07 DIAGNOSIS — Z452 Encounter for adjustment and management of vascular access device: Secondary | ICD-10-CM

## 2016-04-07 LAB — COMPREHENSIVE METABOLIC PANEL
ALT: 27 U/L (ref 0–55)
ANION GAP: 11 meq/L (ref 3–11)
AST: 21 U/L (ref 5–34)
Albumin: 3.8 g/dL (ref 3.5–5.0)
Alkaline Phosphatase: 96 U/L (ref 40–150)
BUN: 9.8 mg/dL (ref 7.0–26.0)
CHLORIDE: 104 meq/L (ref 98–109)
CO2: 24 meq/L (ref 22–29)
CREATININE: 0.7 mg/dL (ref 0.6–1.1)
Calcium: 9.9 mg/dL (ref 8.4–10.4)
EGFR: 90 mL/min/{1.73_m2} — ABNORMAL LOW (ref >90)
Glucose: 93 mg/dl (ref 70–140)
POTASSIUM: 3.8 meq/L (ref 3.5–5.1)
Sodium: 139 mEq/L (ref 136–145)
Total Bilirubin: 0.45 mg/dL (ref 0.20–1.20)
Total Protein: 6.9 g/dL (ref 6.4–8.3)

## 2016-04-07 LAB — CBC WITH DIFFERENTIAL/PLATELET
BASO%: 0.3 % (ref 0.0–2.0)
BASOS ABS: 0 10*3/uL (ref 0.0–0.1)
EOS%: 0.3 % (ref 0.0–7.0)
Eosinophils Absolute: 0 10*3/uL (ref 0.0–0.5)
HCT: 34.1 % — ABNORMAL LOW (ref 34.8–46.6)
HGB: 11.5 g/dL — ABNORMAL LOW (ref 11.6–15.9)
LYMPH%: 17.1 % (ref 14.0–49.7)
MCH: 38.1 pg — AB (ref 25.1–34.0)
MCHC: 33.7 g/dL (ref 31.5–36.0)
MCV: 112.9 fL — ABNORMAL HIGH (ref 79.5–101.0)
MONO#: 0.4 10*3/uL (ref 0.1–0.9)
MONO%: 5.6 % (ref 0.0–14.0)
NEUT#: 5.7 10*3/uL (ref 1.5–6.5)
NEUT%: 76.7 % (ref 38.4–76.8)
Platelets: 111 10*3/uL — ABNORMAL LOW (ref 145–400)
RBC: 3.02 10*6/uL — AB (ref 3.70–5.45)
RDW: 13.4 % (ref 11.2–14.5)
WBC: 7.4 10*3/uL (ref 3.9–10.3)
lymph#: 1.3 10*3/uL (ref 0.9–3.3)

## 2016-04-07 MED ORDER — TEMAZEPAM 15 MG PO CAPS: 15.0000 mg | ORAL_CAPSULE | Freq: Every evening | ORAL | 0 refills | Status: DC | PRN

## 2016-04-07 MED ORDER — HEPARIN SOD (PORK) LOCK FLUSH 100 UNIT/ML IV SOLN
500.0000 [IU] | Freq: Once | INTRAVENOUS | Status: AC
  Administered 2016-04-07: 250 [IU] via INTRAVENOUS
  Filled 2016-04-07: qty 5

## 2016-04-07 MED ORDER — SODIUM CHLORIDE 0.9% FLUSH
10.0000 mL | Freq: Once | INTRAVENOUS | Status: AC
  Administered 2016-04-07: 10 mL via INTRAVENOUS
  Filled 2016-04-07: qty 10

## 2016-04-07 NOTE — Telephone Encounter (Signed)
Orders received per Dr Marko Plume to contact the patient to schedule follow up with Dr Everitt Amber in late October. Attempted to contact the patient on land line and husbands phone . No , answer and unable to leave a voice message ,no mail box set up . Will attempt again tomorrow.

## 2016-04-07 NOTE — Progress Notes (Signed)
OFFICE PROGRESS NOTE   April 07, 2016   Physicians: Terrence Dupont Rossi/ Cindie Laroche, Marin Olp, MD (PCP, Abbie Sons, Scarlette Shorts, Gery Pray   INTERVAL HISTORY:   Patient is seen, together with husband, in continuing attention to adjuvant chemotherapy ongoing for IIIC1 high grade serous carcinoma of endometrium. Cycle 5 was delayed x 1 week due to thrombocytopenia, carbo dose decreased when cycle 5 given on 04-01-16. She will have cycle 6 carbo taxol on 04-21-16 as long as [place] >=1.5 and plt >=100k by labs within 4 days of chemo, with granix x 3 after chemo. PICC can be DCd after cycle 6 chemo is given. Plan is for restaging scans prior to follow up with Dr Denman George.   Patient has had no new or different problems since most recent cycle 5 chemo on 04-01-16 with exception of slight, intermittent neuropathy on soles of feet bilaterally. This peripheral neuropathy is not interfering with walking and not really uncomfortable; she has added oral B complex and is trying to do lots of movement with feet. She has no neuropathy in hands. She has had no bleeding or unusual bruising. Appetite is adequate, no abdominal or pelvic pain, no LE swelling, bowels moving regularly, no nausea, no increased SOB or change in slight chronic cough, no problems with PICC. PICC is flushed 2cx weekly at this office, with dressing changes weekly.  Patient continues to smoke "not sure how much", but has cut back. No one else smokes at home.   PICC placed by IR 03-02-16 No genetics testing.  ONCOLOGIC HISTORY Patient presented to Dr Phineas Real as new patient 10-01-15 after noticing recent slight vaginal spotting. PAP had atypical glandular cells, then sonohysterogram and endometrial biopsy on 10-19-15. The uterus was 7.4 x 3.5 x 3.0 cm with endometrial stripe of 6.8 mm. Endometrial biopsy ([id number] from [facility]) showed endometrial carcinoma which appeared high grade with serous features. She was seen by Dr Josephina Shih  on 10-23-15, exam not remarkable. She had CT CAP 10-28-15 with no evidence of metastatic disease, incidental atherosclerosis and ectatic abdominal aorta. She had surgery at [facility] by Dr Cindie Laroche on 10-29-15, which was robotic hysterectomy BSO with pelvic and paraaortic sentinel node evaluation. Intraoperative findings were of small uterus with normal tubes and ovaries. Adhesions of bladder due to uterine fundus from prior surgery, bilateral pelvic sentinel pelvic mapping to left obturator space node and to right external iliac node, 3 right paraaortic nodes mapped as sentinel nodes, normal upper abdominal survey. Pathology [phone] had high grade serous adenocarcinoma of uterus involving cervical stroma and outer half of myometrium, isolated tumor cells in 2/2 right external iliac and 2/2 left obturator nodes, negative right and left periaortics, bilateral tubes and ovaries benign. Immunohistochemical stains confirmed serous carcinoma, ER and PR negative. [facility] labs 10-27-15 (preoperative) included CA 125 of 12.1, WBC 10.5, Hgb 14.5 and platelets 290k. [facility] gyn oncology multidisciplinary conference 11-04-15 recommended 6 cycles carboplatin taxol given in sandwich fashion with pelvic radiation and vaginal brachytherapy. First carboplatin taxol given 11-20-15, leukopenic with counts not at nadir day 11 cycle 1, ANC 1.2, given granix x 1. She did not have further gCSF until documented neutropenia day 15 cycle 3 with ANC 0.2. Pelvic IMRT was given in sandwich fashion after first 3 cycles of chemo, completed 02-23-16. Chemo resumed with cycle 4 carbo taxol on 03-03-16. HDR x 3 on 03-08-16, 03-17-16, 03-24-16. Cycle 5 delayed x 1 week with thrombocytopenia, carbo dose decreased then.   Objective:  Vital signs in  last 24 hours:  BP (!) 112/95 (BP Location: Left Arm, Patient Position: Sitting)   Pulse (!) 117   Temp 97.8 F (36.6 C) (Oral)   Resp 18   Ht _0  (1.6 m)   Wt 126 lb 12.8 oz (57.5 kg)   SpO2 100%   BMI 22.46  kg/m  Weight down 1.5 lbs Alert, oriented and appropriate, very pleasant as always. Ambulatory without assistance. Not in any acute discomfort, occasional NP cough.  Alopecia  HEENT:PERRL, sclerae not icteric. Oral mucosa moist without lesions, posterior pharynx clear.  Neck supple. No JVD.  Lymphatics:no cervical,supraclavicular or inguinal adenopathy Resp: clear to auscultation bilaterally and no dullness to percussion bilaterally Cardio: regular rate and rhythm. No gallop. GI: soft, nontender, not distended, no mass or organomegaly. Normally active bowel sounds. Surgical incisions not remarkable. Musculoskeletal/ Extremities: without pitting edema, cords, tenderness Neuro:minimal peripheral neuropathy feet as noted. Otherwise nonfocal. PSYCH appropriate mood and affect Skin without rash, ecchymosis, petechiae PICC dressing clean, no surrounding swelling or erythema RUE  Lab Results:  Results for orders placed or performed in visit on 03/31/16  CBC with Differential  Result Value Ref Range   WBC 3.7 (L) 3.9 - 10.3 10e3/uL   NEUT# 2.2 1.5 - 6.5 10e3/uL   HGB 10.5 (L) 11.6 - 15.9 g/dL   HCT 30.6 (L) 34.8 - 46.6 %   Platelets 99 (L) 145 - 400 10e3/uL   MCV 111.3 (H) 79.5 - 101.0 fL   MCH 38.2 (H) 25.1 - 34.0 pg   MCHC 34.3 31.5 - 36.0 g/dL   RBC 2.75 (L) 3.70 - 5.45 10e6/uL   RDW 13.4 11.2 - 14.5 %   lymph# 1.1 0.9 - 3.3 10e3/uL   MONO# 0.4 0.1 - 0.9 10e3/uL   Eosinophils Absolute 0.0 0.0 - 0.5 10e3/uL   Basophils Absolute 0.0 0.0 - 0.1 10e3/uL   NEUT% 58.0 38.4 - 76.8 %   LYMPH% 29.6 14.0 - 49.7 %   MONO% 11.0 0.0 - 14.0 %   EOS% 1.1 0.0 - 7.0 %   BASO% 0.3 0.0 - 2.0 %  Comprehensive metabolic panel  Result Value Ref Range   Sodium 141 136 - 145 mEq/L   Potassium 3.7 3.5 - 5.1 mEq/L   Chloride 107 98 - 109 mEq/L   CO2 24 22 - 29 mEq/L   Glucose 87 70 - 140 mg/dl   BUN 7.8 7.0 - 26.0 mg/dL   Creatinine 0.8 0.6 - 1.1 mg/dL   Total Bilirubin <0.30 0.20 - 1.20 mg/dL    Alkaline Phosphatase 76 40 - 150 U/L   AST 16 5 - 34 U/L   ALT 10 0 - 55 U/L   Total Protein 6.5 6.4 - 8.3 g/dL   Albumin 3.6 3.5 - 5.0 g/dL   Calcium 9.3 8.4 - 10.4 mg/dL   Anion Gap 10 3 - 11 mEq/L   EGFR 79 (L) >90 ml/min/1.73 m2     Studies/Results:  No results found.  Medications: I have reviewed the patient's current medications.  DISCUSSION Patient in agreement with completing cycle 6 carbo taxol as planned. Will check labs with PICC flush on 9-18, ok to use those for treatment on 9-21 as long as ANC >=1.5 and plt >=100k; hopefully with dose reduction of carbo cycle 5 counts will be ok to stay on schedule.  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 With minimal symptoms no indication to change present taxol. Fine for B vitamins and lots of movement. She understands that  hopefully the neuropathy will resolve after chemo, tho that is not always the case. Shoes such as tennis shoes with good support can be most comfortable. Recommended stopping smoking from standpoint of circulation.   Smoking cessation discussed and strongly encouraged. Communication with Norton Blizzard, who will follow up. Fine to use nicotine patches if she would like, patient aware that she should not smoke + use patches.    Message sent to gyn oncology to let them know last chemo anticipated 04-21-16, as she will need CT AP prior to follow up there. (CT CAP pre-op, chest without findings of concern. )  Assessment/Plan:  1.IIIC1 serous high grade endometrial carcinoma with cervical stromal involvement and isolated tumor cells identified in bilateral sentinel pelvic nodes: robotic hysterectomy BSO pelvic and periaortic node evaluation at [facility] 10-29-15, pelvic IMRT completed 02-23-16, adjuvant therapy continuing with chemotherapy in sandwich fashion cycle 6 04-21-16 as long as [place] >=1.5 and plt >=100k by labs at least 04-18-16. .  Vaginal brachytherapy completed. Needs CT AP shortly prior to follow up with gyn  oncology, scheduling pending. 2.Chemo neutropenia: will need gCSF with further carbo taxol, using granix x 3 doses .  3.long and ongoing tobacco abuse: she had cut back at one point to 1-3 cigarettes/ 24 hrs. Smoking cessation encouraged, see above 4. Overdue mammogram, last at [facility], need to address when present treatment completed 5. Four benign colon polyps at colonoscopy by Dr Henrene Pastor 06-2008: repeat per GI 6.osteopenia by bone density scan 09-2015 7.elevated lipids followed by PCP 8.hx BTL 9.radiology evidence of atherosclerosis thoracic and abdominal, and ectatic abdominal aorta. Recommendation to follow up with [place] in 5 years, cc this information to PCP 10. mild leukocytosis and macrocytosis without anemia at consultation visit 11-13-15: mildly elevated WBC thought post op with no symptoms of infection, macrocytosis slightly increased from a year ago, B12 and folate normal 09-2014, no ETOH. Follow 11.Constipation around initial chemo, recent radiation diarrhea resolved. She has been more careful with oral hydration.  12.restless legs several days after chemo, history of similar problem in years past. Gatorade helpful 13. PICC placed by IR 03-02-16. WIll DC this after #6 chemo given, day of treatment if stable. Patient questioned keeping the PICC until repeat CT, however risk with this line seems excessive for another 4+ weeks so she agrees to DC.    All questions answered and patient / husband are in agreement with recommendations and plans. Chemo and granix orders confirmed, keep carbo AUC same as cycle 5 at AUC = 4 for cycle 6. Time spent 25 min including >50% counseling and coordination of care. Route PCP, cc Drs Denman George and Delena Serve, MD   04/07/2016, 12:26 PM

## 2016-04-07 NOTE — Patient Instructions (Signed)
PICC Home Guide A peripherally inserted central catheter (PICC) is a long, thin, flexible tube that is inserted into a vein in the upper arm. It is a form of intravenous (IV) access. It is considered to be a "central" line because the tip of the PICC ends in a large vein in your chest. This large vein is called the superior vena cava (SVC). The PICC tip ends in the SVC because there is a lot of blood flow in the SVC. This allows medicines and IV fluids to be quickly distributed throughout the body. The PICC is inserted using a sterile technique by a specially trained nurse or physician. After the PICC is inserted, a chest X-ray exam is done to be sure it is in the correct place.  A PICC may be placed for different reasons, such as:  To give medicines and liquid nutrition that can only be given through a central line. Examples are:  Certain antibiotic treatments.  Chemotherapy.  Total parenteral nutrition (TPN).  To take frequent blood samples.  To give IV fluids and blood products.  If there is difficulty placing a peripheral intravenous (PIV) catheter. If taken care of properly, a PICC can remain in place for several months. A PICC can also allow a person to go home from the hospital early. Medicine and PICC care can be managed at home by a family member or home health care team. WHAT PROBLEMS CAN HAPPEN WHEN I HAVE A PICC? Problems with a PICC can occasionally occur. These may include the following:  A blood clot (thrombus) forming in or at the tip of the PICC. This can cause the PICC to become clogged. A clot-dissolving medicine called tissue plasminogen activator (tPA) can be given through the PICC to help break up the clot.  Inflammation of the vein (phlebitis) in which the PICC is placed. Signs of inflammation may include redness, pain at the insertion site, red streaks, or being able to feel a "cord" in the vein where the PICC is located.  Infection in the PICC or at the insertion  site. Signs of infection may include fever, chills, redness, swelling, or pus drainage from the PICC insertion site.  PICC movement (malposition). The PICC tip may move from its original position due to excessive physical activity, forceful coughing, sneezing, or vomiting.  A break or cut in the PICC. It is important to not use scissors near the PICC.  Nerve or tendon irritation or injury during PICC insertion. WHAT SHOULD I KEEP IN MIND ABOUT ACTIVITIES WHEN I HAVE A PICC?  You may bend your arm and move it freely. If your PICC is near or at the bend of your elbow, avoid activity with repeated motion at the elbow.  Rest at home for the remainder of the day following PICC line insertion.  Avoid lifting heavy objects as instructed by your health care provider.  Avoid using a crutch with the arm on the same side as your PICC. You may need to use a walker. WHAT SHOULD I KNOW ABOUT MY PICC DRESSING?  Keep your PICC bandage (dressing) clean and dry to prevent infection.  Ask your health care provider when you may shower. Ask your health care provider to teach you how to wrap the PICC when you do take a shower.  Change the PICC dressing as instructed by your health care provider.  Change your PICC dressing if it becomes loose or wet. WHAT SHOULD I KNOW ABOUT PICC CARE?  Check the PICC insertion site   daily for leakage, redness, swelling, or pain.  Do not take a bath, swim, or use hot tubs when you have a PICC. Cover PICC line with clear plastic wrap and tape to keep it dry while showering.  Flush the PICC as directed by your health care provider. Let your health care provider know right away if the PICC is difficult to flush or does not flush. Do not use force to flush the PICC.  Do not use a syringe that is less than 10 mL to flush the PICC.  Never pull or tug on the PICC.  Avoid blood pressure checks on the arm with the PICC.  Keep your PICC identification card with you at all  times.  Do not take the PICC out yourself. Only a trained clinical professional should remove the PICC. SEEK IMMEDIATE MEDICAL CARE IF:  Your PICC is accidentally pulled all the way out. If this happens, cover the insertion site with a bandage or gauze dressing. Do not throw the PICC away. Your health care provider will need to inspect it.  Your PICC was tugged or pulled and has partially come out. Do not  push the PICC back in.  There is any type of drainage, redness, or swelling where the PICC enters the skin.  You cannot flush the PICC, it is difficult to flush, or the PICC leaks around the insertion site when it is flushed.  You hear a "flushing" sound when the PICC is flushed.  You have pain, discomfort, or numbness in your arm, shoulder, or jaw on the same side as the PICC.  You feel your heart "racing" or skipping beats.  You notice a hole or tear in the PICC.  You develop chills or a fever. MAKE SURE YOU:   Understand these instructions.  Will watch your condition.  Will get help right away if you are not doing well or get worse.   This information is not intended to replace advice given to you by your health care provider. Make sure you discuss any questions you have with your health care provider.   Document Released: 01/22/2003 Document Revised: 08/08/2014 Document Reviewed: 03/25/2013 Elsevier Interactive Patient Education 2016 [employer]  

## 2016-04-08 ENCOUNTER — Telehealth: Payer: Self-pay | Admitting: *Deleted

## 2016-04-08 NOTE — Telephone Encounter (Signed)
Oncology Nurse Navigator Documentation  Oncology Nurse Navigator Flowsheets 04/08/2016  Navigator Encounter Type Telephone/per Dr. Mariana Kaufman request, I called patient about her smoking cessation. I was unable to reach.  I left my name and phone number to call.    Telephone Outgoing Call  Treatment Phase Treatment  Barriers/Navigation Needs Education  Education Smoking cessation  Interventions Education Method  Education Method Verbal  Acuity Level 1  Time Spent with Patient 15

## 2016-04-09 DIAGNOSIS — G620 Drug-induced polyneuropathy: Secondary | ICD-10-CM | POA: Insufficient documentation

## 2016-04-09 DIAGNOSIS — T451X5A Adverse effect of antineoplastic and immunosuppressive drugs, initial encounter: Secondary | ICD-10-CM

## 2016-04-11 ENCOUNTER — Ambulatory Visit (HOSPITAL_BASED_OUTPATIENT_CLINIC_OR_DEPARTMENT_OTHER): Payer: BLUE CROSS/BLUE SHIELD

## 2016-04-11 DIAGNOSIS — Z452 Encounter for adjustment and management of vascular access device: Secondary | ICD-10-CM | POA: Diagnosis not present

## 2016-04-11 DIAGNOSIS — C541 Malignant neoplasm of endometrium: Secondary | ICD-10-CM | POA: Diagnosis not present

## 2016-04-11 DIAGNOSIS — Z95828 Presence of other vascular implants and grafts: Secondary | ICD-10-CM

## 2016-04-11 MED ORDER — HEPARIN SOD (PORK) LOCK FLUSH 100 UNIT/ML IV SOLN
500.0000 [IU] | Freq: Once | INTRAVENOUS | Status: AC | PRN
  Administered 2016-04-11: 500 [IU] via INTRAVENOUS
  Filled 2016-04-11: qty 5

## 2016-04-11 MED ORDER — SODIUM CHLORIDE 0.9 % IJ SOLN
10.0000 mL | INTRAMUSCULAR | Status: DC | PRN
  Administered 2016-04-11: 10 mL via INTRAVENOUS
  Filled 2016-04-11: qty 10

## 2016-04-14 ENCOUNTER — Other Ambulatory Visit: Payer: Self-pay | Admitting: Oncology

## 2016-04-14 ENCOUNTER — Ambulatory Visit: Payer: BLUE CROSS/BLUE SHIELD

## 2016-04-14 ENCOUNTER — Encounter: Payer: Self-pay | Admitting: Oncology

## 2016-04-14 ENCOUNTER — Ambulatory Visit: Payer: BLUE CROSS/BLUE SHIELD | Admitting: Oncology

## 2016-04-14 ENCOUNTER — Encounter: Payer: Self-pay | Admitting: *Deleted

## 2016-04-14 ENCOUNTER — Ambulatory Visit (HOSPITAL_BASED_OUTPATIENT_CLINIC_OR_DEPARTMENT_OTHER): Payer: BLUE CROSS/BLUE SHIELD

## 2016-04-14 ENCOUNTER — Other Ambulatory Visit: Payer: BLUE CROSS/BLUE SHIELD

## 2016-04-14 DIAGNOSIS — Z452 Encounter for adjustment and management of vascular access device: Secondary | ICD-10-CM | POA: Diagnosis not present

## 2016-04-14 DIAGNOSIS — Z95828 Presence of other vascular implants and grafts: Secondary | ICD-10-CM

## 2016-04-14 DIAGNOSIS — C541 Malignant neoplasm of endometrium: Secondary | ICD-10-CM | POA: Diagnosis not present

## 2016-04-14 MED ORDER — SODIUM CHLORIDE 0.9 % IJ SOLN
10.0000 mL | INTRAMUSCULAR | Status: DC | PRN
  Administered 2016-04-14: 10 mL via INTRAVENOUS
  Filled 2016-04-14: qty 10

## 2016-04-14 MED ORDER — HEPARIN SOD (PORK) LOCK FLUSH 100 UNIT/ML IV SOLN
500.0000 [IU] | Freq: Once | INTRAVENOUS | Status: AC | PRN
  Administered 2016-04-14: 500 [IU] via INTRAVENOUS
  Filled 2016-04-14: qty 5

## 2016-04-14 NOTE — Progress Notes (Signed)
Oncology Nurse Navigator Documentation  Oncology Nurse Navigator Flowsheets 04/14/2016  Navigator Encounter Type Telephone/I received a call from Jacqueline Hammond today. I listened as she explained her wanting to quit smoking.  We discussed tips to help.  She was excited some of the tips to help quit.  She will try the nicotine patches.  Patient will be here today. I gave front desk information on smoking cessation for patient and asked her to call if needed.   Telephone Outgoing Call  Treatment Phase Treatment  Barriers/Navigation Needs Education  Education Smoking cessation  Interventions Education Method  Education Method Verbal;Written  Acuity Level 2  Acuity Level 2 Educational needs  Time Spent with Patient 30

## 2016-04-14 NOTE — Progress Notes (Signed)
Medical Oncology  Per gyn onc, appointment set up for Dr Denman George on 05-09-16.  Order for CT AP to be done 05-06-16 placed by this MD now. Scheduling message sent. Message to [facility] managed care for preauth, specifically asked them to let radiology scheduling know when preauth done so that scan can be set up.  Godfrey Pick, MD

## 2016-04-15 ENCOUNTER — Ambulatory Visit: Payer: BLUE CROSS/BLUE SHIELD

## 2016-04-16 ENCOUNTER — Ambulatory Visit: Payer: BLUE CROSS/BLUE SHIELD

## 2016-04-18 ENCOUNTER — Ambulatory Visit: Payer: BLUE CROSS/BLUE SHIELD

## 2016-04-18 ENCOUNTER — Other Ambulatory Visit (HOSPITAL_BASED_OUTPATIENT_CLINIC_OR_DEPARTMENT_OTHER): Payer: BLUE CROSS/BLUE SHIELD

## 2016-04-18 ENCOUNTER — Other Ambulatory Visit: Payer: Self-pay | Admitting: Oncology

## 2016-04-18 ENCOUNTER — Telehealth: Payer: Self-pay

## 2016-04-18 DIAGNOSIS — C541 Malignant neoplasm of endometrium: Secondary | ICD-10-CM | POA: Diagnosis not present

## 2016-04-18 DIAGNOSIS — Z95828 Presence of other vascular implants and grafts: Secondary | ICD-10-CM

## 2016-04-18 LAB — COMPREHENSIVE METABOLIC PANEL
ALBUMIN: 3.4 g/dL — AB (ref 3.5–5.0)
ALK PHOS: 83 U/L (ref 40–150)
ALT: 14 U/L (ref 0–55)
AST: 16 U/L (ref 5–34)
Anion Gap: 8 mEq/L (ref 3–11)
BUN: 8.3 mg/dL (ref 7.0–26.0)
CALCIUM: 9.3 mg/dL (ref 8.4–10.4)
CO2: 25 mEq/L (ref 22–29)
Chloride: 108 mEq/L (ref 98–109)
Creatinine: 0.8 mg/dL (ref 0.6–1.1)
EGFR: 80 mL/min/{1.73_m2} — AB (ref >90)
GLUCOSE: 84 mg/dL (ref 70–140)
Potassium: 3.7 mEq/L (ref 3.5–5.1)
SODIUM: 141 meq/L (ref 136–145)
TOTAL PROTEIN: 6.3 g/dL — AB (ref 6.4–8.3)

## 2016-04-18 LAB — CBC WITH DIFFERENTIAL/PLATELET
BASO%: 0.5 % (ref 0.0–2.0)
BASOS ABS: 0 10*3/uL (ref 0.0–0.1)
EOS ABS: 0 10*3/uL (ref 0.0–0.5)
EOS%: 0.2 % (ref 0.0–7.0)
HEMATOCRIT: 30.4 % — AB (ref 34.8–46.6)
HEMOGLOBIN: 10.2 g/dL — AB (ref 11.6–15.9)
LYMPH#: 1 10*3/uL (ref 0.9–3.3)
LYMPH%: 31.1 % (ref 14.0–49.7)
MCH: 38.3 pg — AB (ref 25.1–34.0)
MCHC: 33.6 g/dL (ref 31.5–36.0)
MCV: 114 fL — AB (ref 79.5–101.0)
MONO#: 0.5 10*3/uL (ref 0.1–0.9)
MONO%: 16.1 % — AB (ref 0.0–14.0)
NEUT%: 52.1 % (ref 38.4–76.8)
NEUTROS ABS: 1.7 10*3/uL (ref 1.5–6.5)
PLATELETS: 118 10*3/uL — AB (ref 145–400)
RBC: 2.66 10*6/uL — ABNORMAL LOW (ref 3.70–5.45)
RDW: 14.3 % (ref 11.2–14.5)
WBC: 3.3 10*3/uL — AB (ref 3.9–10.3)

## 2016-04-18 MED ORDER — HEPARIN SOD (PORK) LOCK FLUSH 100 UNIT/ML IV SOLN
500.0000 [IU] | Freq: Once | INTRAVENOUS | Status: AC | PRN
  Administered 2016-04-18: 500 [IU] via INTRAVENOUS
  Filled 2016-04-18: qty 5

## 2016-04-18 MED ORDER — SODIUM CHLORIDE 0.9 % IJ SOLN
10.0000 mL | INTRAMUSCULAR | Status: DC | PRN
  Administered 2016-04-18: 10 mL via INTRAVENOUS
  Filled 2016-04-18: qty 10

## 2016-04-18 NOTE — Telephone Encounter (Signed)
-----   Message from Gordy Levan, MD sent at 04/09/2016  1:06 PM EDT ----- Regarding: labs 04-18-16 For labs with PICC flush 9-18  If ANC >=1.5 and plt >=100k ok for chemo on 9-21. If does not meet parameters, depending on values, either recheck lab on 9-21 or delay x 1 week (add PICC flushes if so)  Will need to let patient know  thanks

## 2016-04-18 NOTE — Patient Instructions (Signed)

## 2016-04-18 NOTE — Telephone Encounter (Signed)
Told Jacqueline Hammond that her [place] is 1.7 and Platelets are 118K today so she is fine for her treatment Thursday 04-21-16 per Dr Mariana Kaufman parameters noted below. If she is stable after her treatment 04-21-16 the PICC line can be discontinued per Dr. Marko Plume.

## 2016-04-19 ENCOUNTER — Ambulatory Visit: Payer: BLUE CROSS/BLUE SHIELD

## 2016-04-21 ENCOUNTER — Other Ambulatory Visit: Payer: BLUE CROSS/BLUE SHIELD

## 2016-04-21 ENCOUNTER — Other Ambulatory Visit: Payer: Self-pay | Admitting: Oncology

## 2016-04-21 ENCOUNTER — Ambulatory Visit (HOSPITAL_BASED_OUTPATIENT_CLINIC_OR_DEPARTMENT_OTHER): Payer: BLUE CROSS/BLUE SHIELD

## 2016-04-21 VITALS — BP 135/79 | HR 80 | Temp 98.2°F | Resp 18

## 2016-04-21 DIAGNOSIS — C541 Malignant neoplasm of endometrium: Secondary | ICD-10-CM | POA: Diagnosis not present

## 2016-04-21 DIAGNOSIS — Z5111 Encounter for antineoplastic chemotherapy: Secondary | ICD-10-CM

## 2016-04-21 MED ORDER — SODIUM CHLORIDE 0.9 % IV SOLN
Freq: Once | INTRAVENOUS | Status: AC
  Administered 2016-04-21: 14:00:00 via INTRAVENOUS
  Filled 2016-04-21: qty 5

## 2016-04-21 MED ORDER — FAMOTIDINE IN NACL 20-0.9 MG/50ML-% IV SOLN
INTRAVENOUS | Status: AC
  Filled 2016-04-21: qty 50

## 2016-04-21 MED ORDER — DIPHENHYDRAMINE HCL 50 MG/ML IJ SOLN
INTRAMUSCULAR | Status: AC
  Filled 2016-04-21: qty 1

## 2016-04-21 MED ORDER — DIPHENHYDRAMINE HCL 50 MG/ML IJ SOLN: 25.0000 mg | Freq: Once | INTRAMUSCULAR | Status: DC

## 2016-04-21 MED ORDER — SODIUM CHLORIDE 0.9 % IV SOLN
380.0000 mg | Freq: Once | INTRAVENOUS | Status: AC
  Administered 2016-04-21: 380 mg via INTRAVENOUS
  Filled 2016-04-21: qty 38

## 2016-04-21 MED ORDER — FAMOTIDINE IN NACL 20-0.9 MG/50ML-% IV SOLN
20.0000 mg | Freq: Once | INTRAVENOUS | Status: AC
  Administered 2016-04-21: 20 mg via INTRAVENOUS

## 2016-04-21 MED ORDER — SODIUM CHLORIDE 0.9 % IV SOLN
Freq: Once | INTRAVENOUS | Status: AC
  Administered 2016-04-21: 14:00:00 via INTRAVENOUS
  Filled 2016-04-21: qty 4

## 2016-04-21 MED ORDER — SODIUM CHLORIDE 0.9 % IV SOLN
Freq: Once | INTRAVENOUS | Status: AC
  Administered 2016-04-21: 13:00:00 via INTRAVENOUS

## 2016-04-21 MED ORDER — DIPHENHYDRAMINE HCL 25 MG PO CAPS
ORAL_CAPSULE | ORAL | Status: AC
  Filled 2016-04-21: qty 1

## 2016-04-21 MED ORDER — SODIUM CHLORIDE 0.9 % IV SOLN
135.0000 mg/m2 | Freq: Once | INTRAVENOUS | Status: AC
  Administered 2016-04-21: 228 mg via INTRAVENOUS
  Filled 2016-04-21: qty 38

## 2016-04-21 MED ORDER — DIPHENHYDRAMINE HCL 25 MG PO TABS
25.0000 mg | ORAL_TABLET | Freq: Once | ORAL | Status: AC
  Administered 2016-04-21: 25 mg via ORAL
  Filled 2016-04-21: qty 1

## 2016-04-21 NOTE — Patient Instructions (Addendum)
[facility] Discharge Instructions for Patients Receiving Chemotherapy  Today you received the following chemotherapy agents, Carboplatin and taxol  To help prevent nausea and vomiting after your treatment, we encourage you to take your nausea medication as directed.   If you develop nausea and vomiting that is not controlled by your nausea medication, call the clinic.   BELOW ARE SYMPTOMS THAT SHOULD BE REPORTED IMMEDIATELY:  *FEVER GREATER THAN 100.5 F  *CHILLS WITH OR WITHOUT FEVER  NAUSEA AND VOMITING THAT IS NOT CONTROLLED WITH YOUR NAUSEA MEDICATION  *UNUSUAL SHORTNESS OF BREATH  *UNUSUAL BRUISING OR BLEEDING  TENDERNESS IN MOUTH AND THROAT WITH OR WITHOUT PRESENCE OF ULCERS  *URINARY PROBLEMS  *BOWEL PROBLEMS  UNUSUAL RASH Items with * indicate a potential emergency and should be followed up as soon as possible.  Feel free to call the clinic you have any questions or concerns. The clinic phone number is [phone].  Please show the [facility] at check-in to the Emergency Department and triage nurse.    PICC Removal, Care After Refer to this sheet in the next few weeks. These instructions provide you with information on caring for yourself after your procedure. Your health care provider may also give you more specific instructions. Your treatment has been planned according to current medical practices, but problems sometimes occur. Call your health care provider if you have any problems or questions after your procedure. WHAT TO EXPECT AFTER THE PROCEDURE After your procedure, it is typical to have mild discomfort at the insertion site. This should not last for more than a day. HOME CARE INSTRUCTIONS You may remove the bandage after 24 hours. The PICC insertion site is very small. A small scab may develop over the insertion site.  It is okay to wash the site gently with soap and water. Be careful not to remove or pick off the scab. Gently pat  the site dry after washing it. You do not need to put another bandage over the insertion site. Do not lift anything heavy or do strenuous physical activity for 24 hours after the PICC is removed. This includes: Weight lifting. Strenuous yard work. Any physical activity with repetitive arm movement.  SEEK MEDICAL CARE IF:  You have swelling or puffiness in your arm at the PICC insertion site. You have increasing tenderness at the PICC insertion site. SEEK IMMEDIATE MEDICAL CARE IF:  You have numbness or tingling in your fingers, hand, or arm. Your arm looks blue and feels cold. You have redness around the insertion site or a red streak goes up your arm. You have any type of drainage from the PICC insertion site. This includes drainage such as: Bleeding from the insertion site. If this happens, apply firm, direct pressure to the PICC insertion site with a clean towel. Drainage that is yellow or tan. You have a fever.   This information is not intended to replace advice given to you by your health care provider. Make sure you discuss any questions you have with your health care provider.   Document Released: 07/23/2013 Document Revised: 08/08/2014 Document Reviewed: 07/23/2013 Elsevier Interactive Patient Education [employer].

## 2016-04-21 NOTE — Progress Notes (Signed)
1915 patient and vital signs stable at time of discharge.

## 2016-04-22 ENCOUNTER — Telehealth: Payer: Self-pay | Admitting: Oncology

## 2016-04-22 NOTE — Telephone Encounter (Signed)
Patient called to reschedule appointment with lab and Dr. Marko Plume. Appointment rescheduled. 04/22/16

## 2016-04-25 ENCOUNTER — Ambulatory Visit (HOSPITAL_BASED_OUTPATIENT_CLINIC_OR_DEPARTMENT_OTHER): Payer: BLUE CROSS/BLUE SHIELD

## 2016-04-25 VITALS — BP 126/80 | HR 99 | Temp 98.4°F | Resp 20

## 2016-04-25 DIAGNOSIS — C541 Malignant neoplasm of endometrium: Secondary | ICD-10-CM | POA: Diagnosis not present

## 2016-04-25 DIAGNOSIS — Z5189 Encounter for other specified aftercare: Secondary | ICD-10-CM

## 2016-04-25 DIAGNOSIS — Z95828 Presence of other vascular implants and grafts: Secondary | ICD-10-CM

## 2016-04-25 MED ORDER — TBO-FILGRASTIM 300 MCG/0.5ML ~~LOC~~ SOSY
300.0000 ug | PREFILLED_SYRINGE | Freq: Once | SUBCUTANEOUS | Status: AC
  Administered 2016-04-25: 300 ug via SUBCUTANEOUS
  Filled 2016-04-25: qty 0.5

## 2016-04-25 NOTE — Patient Instructions (Signed)

## 2016-04-26 ENCOUNTER — Ambulatory Visit (HOSPITAL_BASED_OUTPATIENT_CLINIC_OR_DEPARTMENT_OTHER): Payer: BLUE CROSS/BLUE SHIELD

## 2016-04-26 VITALS — BP 131/90 | HR 100 | Temp 98.0°F | Resp 18

## 2016-04-26 DIAGNOSIS — C541 Malignant neoplasm of endometrium: Secondary | ICD-10-CM | POA: Diagnosis not present

## 2016-04-26 DIAGNOSIS — Z95828 Presence of other vascular implants and grafts: Secondary | ICD-10-CM

## 2016-04-26 DIAGNOSIS — Z5189 Encounter for other specified aftercare: Secondary | ICD-10-CM | POA: Diagnosis not present

## 2016-04-26 MED ORDER — TBO-FILGRASTIM 300 MCG/0.5ML ~~LOC~~ SOSY
300.0000 ug | PREFILLED_SYRINGE | Freq: Once | SUBCUTANEOUS | Status: AC
  Administered 2016-04-26: 300 ug via SUBCUTANEOUS
  Filled 2016-04-26: qty 0.5

## 2016-04-26 NOTE — Patient Instructions (Signed)

## 2016-04-27 ENCOUNTER — Ambulatory Visit (HOSPITAL_BASED_OUTPATIENT_CLINIC_OR_DEPARTMENT_OTHER): Payer: BLUE CROSS/BLUE SHIELD

## 2016-04-27 VITALS — BP 136/79 | HR 100 | Temp 98.0°F | Resp 20

## 2016-04-27 DIAGNOSIS — Z5189 Encounter for other specified aftercare: Secondary | ICD-10-CM

## 2016-04-27 DIAGNOSIS — C541 Malignant neoplasm of endometrium: Secondary | ICD-10-CM | POA: Diagnosis not present

## 2016-04-27 DIAGNOSIS — Z95828 Presence of other vascular implants and grafts: Secondary | ICD-10-CM

## 2016-04-27 MED ORDER — TBO-FILGRASTIM 300 MCG/0.5ML ~~LOC~~ SOSY
300.0000 ug | PREFILLED_SYRINGE | Freq: Once | SUBCUTANEOUS | Status: AC
  Administered 2016-04-27: 300 ug via SUBCUTANEOUS
  Filled 2016-04-27: qty 0.5

## 2016-04-27 NOTE — Patient Instructions (Signed)

## 2016-05-04 ENCOUNTER — Encounter: Payer: Self-pay | Admitting: Oncology

## 2016-05-05 ENCOUNTER — Ambulatory Visit
Admission: RE | Admit: 2016-05-05 | Discharge: 2016-05-05 | Disposition: A | Discharge status: Home / Self Care | Payer: BLUE CROSS/BLUE SHIELD | Source: Ambulatory Visit | Attending: Radiation Oncology | Admitting: Radiation Oncology

## 2016-05-05 ENCOUNTER — Encounter: Payer: Self-pay | Admitting: Radiation Oncology

## 2016-05-05 VITALS — BP 123/88 | HR 102 | Temp 98.4°F | Ht 63.0 in | Wt 128.2 lb

## 2016-05-05 DIAGNOSIS — C541 Malignant neoplasm of endometrium: Secondary | ICD-10-CM | POA: Insufficient documentation

## 2016-05-05 DIAGNOSIS — Z79899 Other long term (current) drug therapy: Secondary | ICD-10-CM | POA: Diagnosis not present

## 2016-05-05 NOTE — Progress Notes (Signed)
Jacqueline Hammond here for follow up.  She denies having pain other than neuropathy in her feet.  She denies having any bladder/bowel issues or vaginal/rectal bleeding or discharge.  She reports having a good appetite and her energy level is improving.  She will have a CT scan tomorrow.  She has been given a size S+ and M vaginal dilator.  She is wondering if she can follow up in [place] next year due to insurance.  BP 123/88 (BP Location: Right Arm, Patient Position: Sitting)   Pulse (!) 102   Temp 98.4 F (36.9 C) (Oral)   Ht 5\' 3"  (1.6 m)   Wt 128 lb 3.2 oz (58.2 kg)   SpO2 100%   BMI 22.71 kg/m    Wt Readings from Last 3 Encounters:  05/05/16 128 lb 3.2 oz (58.2 kg)  04/07/16 126 lb 12.8 oz (57.5 kg)  03/14/16 128 lb 8 oz (58.3 kg)

## 2016-05-05 NOTE — Progress Notes (Signed)
  Home Care Instructions for the Insertion and Care of Your Vaginal Dilator  Why Do I Need a Vaginal Dilator?  Internal radiation therapy may cause scar tissue to form at the top of your vagina (vaginal cuff).  This may make vaginal examinations difficult in the future. You can prevent scar tissue from forming by using a vaginal dilator (a smooth plastic rod), and/or by having regular sexual intercourse.  If not using the dilator you should be having intercourse two or three times a week.  If you are unable to have intercourse, you should use your vaginal dilator.  You may have some spotting or bleeding from your dilator or intercourse the first few times. You may also have some discomfort. If discomfort occurs with intercourse, you and your partner may need to stop for a while and try again later.  How to Use Your Vaginal Dilator  - Wash the dilator with soap and water before and after each use. - Check the dilator to be sure it is smooth. Do not use the dilator if you find any roughspots. - Coat the dilator with K-Y Jelly, Astroglide, or Replens. Do not use Vaseline, baby oil, or other oil based lubricants. They are not water-soluble and can be irritating to the tissues in the vagina. - Lie on your back with your knees bent and legs apart. - Insert the rounded end of the dilator into your vagina as far as it will go without causing pain or discomfort. - Close your knees and slowly straighten your legs. - Keep the dilator in your vagina for about 10 to 15 minutes. - Bend your knees, open your legs, and gently remove the dilator. - Gently cleanse the skin around the vaginal opening. - Wash the dilator after each use. -  It is important that you use the dilator routinely until instructed otherwise by your doctor.   

## 2016-05-05 NOTE — Progress Notes (Addendum)
Radiation Oncology         [phone] ________________________________  Name: Jacqueline Hammond MRN: [id number]  Date: 05/05/2016  DOB: [birth year]  Follow-Up Visit Note  CC: [name], MD  Marin Olp, MD    ICD-9-CM ICD-10-CM   1. 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  She denies having pain other than neuropathy in her feet. She denies having any bladder/bowel issues or vaginal/rectal bleeding or discharge. She reports having a good appetite and her energy level is improving. She will have a CT scan tomorrow. She has been given a size S+ and M vaginal dilator. She is wondering if she can follow up in [place] next year due to insurance.                          ALLERGIES:  is allergic to codeine sulfate and neomycin-bacitracin zn-polymyx.  Meds: Current Outpatient Prescriptions  Medication Sig Dispense Refill  . temazepam (RESTORIL) 15 MG capsule Take 1 capsule (15 mg total) by mouth at bedtime as needed. for sleep 20 capsule 0  . atorvastatin (LIPITOR) 20 MG tablet TAKE 1 TABLET BY MOUTH EVERY DAY (Patient not taking: Reported on 05/05/2016) 30 tablet 5  . dexamethasone (DECADRON) 4 MG tablet Take 5 tablets with food 12 hrs and 6 hrs before Taxol Chemotherapy (Patient not taking: Reported on 05/05/2016) 30 tablet 0  . docusate sodium (COLACE) 100 MG capsule Take 100 mg by mouth 2 (two) times daily as needed for mild constipation. Reported on 02/16/2016    . loperamide (IMODIUM) 2 MG capsule Take 2 mg by mouth as needed for diarrhea or loose stools.    Marland Kitchen LORazepam (ATIVAN) 0.5 MG tablet Place 1 tablet under the tongue or swallow every 6 hrs as needed for nausea.  Will make Drowsy. (Patient not taking: Reported on 05/05/2016) 20 tablet 0  . ondansetron (ZOFRAN) 8 MG tablet Take 1 tablet (8 mg  total) by mouth every 8 (eight) hours as needed for nausea or vomiting (Will not make Drowsy.). (Patient not taking: Reported on 05/05/2016) 30 tablet 1  . polyethylene glycol (MIRALAX / GLYCOLAX) packet Take 17 g by mouth 2 (two) times daily as needed. Reported on 02/16/2016     No current facility-administered medications for this encounter.     Physical Findings: The patient is in no acute distress. Patient is alert and oriented.  height is 5\' 3"  (1.6 m) and weight is 128 lb 3.2 oz (58.2 kg). Her oral temperature is 98.4 F (36.9 C). Her blood pressure is 123/88 and her pulse is 102 (abnormal). Her oxygen saturation is 100%. .  No significant changes.  Lungs are clear to auscultation bilaterally. Heart has regular rate and rhythm. No palpable cervical, supraclavicular, or axillary adenopathy. Abdomen soft, non-tender, normal bowel sounds.  Pelvic exam deferred, she will see Dr. Denman George later this month for pelvic exam  Lab Findings: Lab Results  Component Value Date   WBC 3.3 (L) 04/18/2016   HGB 10.2 (L) 04/18/2016   HCT 30.4 (L) 04/18/2016   MCV 114.0 (H) 04/18/2016   PLT 118 (L) 04/18/2016    Radiographic Findings: No results found.  Impression:  The patient is recovering from effects of radiation and chemotherapy. Energy level is 80% of  normal.   Plan:  Patient will return for a follow-up with myself in 3 months.  ____________________________________  This document serves as a record of services personally performed by Gery Pray, MD. It was created on his behalf by Bethann Humble, a trained medical scribe. The creation of this record is based on the scribe's personal observations and the provider's statements to them. This document has been checked and approved by the attending provider.

## 2016-05-06 ENCOUNTER — Ambulatory Visit (HOSPITAL_COMMUNITY)
Admission: RE | Admit: 2016-05-06 | Discharge: 2016-05-06 | Disposition: A | Discharge status: Home / Self Care | Payer: BLUE CROSS/BLUE SHIELD | Source: Ambulatory Visit | Attending: Oncology | Admitting: Oncology

## 2016-05-06 ENCOUNTER — Encounter (HOSPITAL_COMMUNITY): Payer: Self-pay

## 2016-05-06 DIAGNOSIS — I700 Atherosclerosis of aorta: Secondary | ICD-10-CM | POA: Insufficient documentation

## 2016-05-06 DIAGNOSIS — I77811 Abdominal aortic ectasia: Secondary | ICD-10-CM | POA: Insufficient documentation

## 2016-05-06 DIAGNOSIS — C541 Malignant neoplasm of endometrium: Secondary | ICD-10-CM

## 2016-05-06 DIAGNOSIS — I513 Intracardiac thrombosis, not elsewhere classified: Secondary | ICD-10-CM | POA: Diagnosis not present

## 2016-05-06 DIAGNOSIS — I708 Atherosclerosis of other arteries: Secondary | ICD-10-CM | POA: Diagnosis not present

## 2016-05-06 MED ORDER — IOPAMIDOL (ISOVUE-300) INJECTION 61%
100.0000 mL | Freq: Once | INTRAVENOUS | Status: AC | PRN
  Administered 2016-05-06: 100 mL via INTRAVENOUS

## 2016-05-09 ENCOUNTER — Ambulatory Visit: Payer: BLUE CROSS/BLUE SHIELD | Admitting: Gynecologic Oncology

## 2016-05-12 ENCOUNTER — Other Ambulatory Visit: Payer: BLUE CROSS/BLUE SHIELD

## 2016-05-12 ENCOUNTER — Ambulatory Visit: Payer: BLUE CROSS/BLUE SHIELD | Admitting: Oncology

## 2016-05-17 ENCOUNTER — Other Ambulatory Visit: Payer: Self-pay | Admitting: Oncology

## 2016-05-17 NOTE — Progress Notes (Signed)
Follow-up Note: Gyn-Onc   Jacqueline Hammond 65 y.o. female  Chief Complaint  Patient presents with  . endometrial cancer    follow up    Assessment :Stage IIIC1 (with ITC's) serous high grade endometrial adenocarcioma. Cervical stromal involvement.  Plan   Patient has had a complete response to therapy with no evidence of residual or recurrent disease. She has no significant persistent toxicities to therapy. Post treatment imaging unremarkable.  Recommend seeing her every 3 months for physical exam, pelvic exam and symptom assessment. I discussed symptoms concerning for maligancy and notified her that she should inform us of these should they develop.  HPI: 65 year old white married female gravida 2 seen in consultation request of Dr. Abbie Sons regarding management of a newly diagnosed endometrial carcinoma. The patient initially had some spotting and a Pap smear revealing atypical glandular cells. She underwent sonohysterogram and endometrial biopsy on 10/19/2015 revealing an endometrial adenocarcinoma with serous features (high-grade) the patient is currently not having any bleeding and has no pelvic symptoms. The uterus measured 7.4 x 3.5 x 3.0 cm with an endometrial stripe of 6.8 mm.  She has no significant past gynecologic history although she had a tubal ligation through a Pfannenstiel incision. Subsequently she underwent diagnostic laparoscopy and lysis of adhesions for pelvic pain which is now resolved. She claims her Pap smears the past and been normal. She has no other significant medical history.  On 10/29/15 she underwent a robotic hysterectomy, BSO and sentinel lymph node biopsy of pelvic and PA nodes at [facility] with Dr Clarene Essex. Surgery was uncomplicated. Postoperative pathology revealed a 68mm tumor of high grade serous adenocarcinoma involving the cervical stroma with no LVSI. There was outer half myometrial infiltration (12 of 31mm). The left and right pelvic SLN's were positive for  ITC's on IHC.  Interval Hx:   She commenced "sandwich" adjuvant therapy with 6 cycles (total) of chemotherapy with carboplatin and paclitaxel completed on 04/21/16, and pelvic external beam radiation (with brachytherapy) completed on 02-23-16.  She did very well during treatment.   Persistent toxicities include mild neuropathy and diarrhea (though this is a chronic problem).  Review of Systems:10 point review of systems is negative except as noted in interval history.   Vitals: Blood pressure 128/84, pulse 77, temperature 97.5 F (36.4 C), temperature source Oral, resp. rate 18, weight 129 lb 6.4 oz (58.7 kg), SpO2 100 %.  Physical Exam: General : The patient is a healthy woman in no acute distress.  HEENT: normocephalic, extraoccular movements normal; neck is supple without thyromegally  Lynphnodes: Supraclavicular and inguinal nodes not enlarged  Abdomen: Soft, non-tender, no ascites, no organomegally, no masses, no hernias , well-healed laparoscopic incisions. Pelvic:  EGBUS: Normal female  Vagina: vaginal cuff in tact, free of cellulitis or blood. Healing well.  Urethra and Bladder: Normal, non-tender  Cervix: surgically absent Uterus: surgically absent Bi-manual examination: Non-tender; no masses Rectal: deferred Lower extremities: No edema or varicosities. Normal range of motion      Allergies  Allergen Reactions  . Codeine Sulfate     REACTION: nausea, pass out  . Neomycin-Bacitracin Zn-Polymyx     REACTION: redness    Past Medical History:  Diagnosis Date  . Adenocarcinoma of endometrium ([place]) 09/2015  . Atypical glandular cells of undetermined significance (AGUS) on cervical Pap smear 09/2015  . History of radiation therapy 01/19/16-03/24/16  . Hyperlipidemia   . Osteopenia 09/2015   T score -1.4 FRAX 8.5%/0.8%    Past Surgical History:  Procedure Laterality  Date  . IR GENERIC HISTORICAL  03/02/2016   IR US GUIDE VASC ACCESS RIGHT 03/02/2016 Arne Cleveland, MD  [facility]  . IR GENERIC HISTORICAL  03/02/2016   IR FLUORO GUIDE CV LINE RIGHT 03/02/2016 Arne Cleveland, MD [facility]INTER[facility]  . LAPAROSCOPY    . ROBOTIC ASSISTED TOTAL HYSTERECTOMY WITH BILATERAL SALPINGO OOPHERECTOMY  10/29/15   robotic hysterectomy, BSO and sentinel lymph node biopsy of pelvic and PA nodes at [facility] with Dr Clarene Essex.   . TUBAL LIGATION      Current Outpatient Prescriptions  Medication Sig Dispense Refill  . temazepam (RESTORIL) 15 MG capsule Take 1 capsule (15 mg total) by mouth at bedtime as needed. for sleep 20 capsule 0  . atorvastatin (LIPITOR) 20 MG tablet TAKE 1 TABLET BY MOUTH EVERY DAY 30 tablet 5  . loperamide (IMODIUM) 2 MG capsule Take 2 mg by mouth as needed for diarrhea or loose stools.    Marland Kitchen LORazepam (ATIVAN) 0.5 MG tablet Place 1 tablet under the tongue or swallow every 6 hrs as needed for nausea.  Will make Drowsy. (Patient not taking: Reported on 05/19/2016) 20 tablet 0  . ondansetron (ZOFRAN) 8 MG tablet Take 1 tablet (8 mg total) by mouth every 8 (eight) hours as needed for nausea or vomiting (Will not make Drowsy.). (Patient not taking: Reported on 05/19/2016) 30 tablet 1  . polyethylene glycol (MIRALAX / GLYCOLAX) packet Take 17 g by mouth 2 (two) times daily as needed. Reported on 02/16/2016     No current facility-administered medications for this visit.     Social History   Social History  . Marital status: Married    Spouse name: N/A  . Number of children: 2  . Years of education: N/A   Occupational History  . Not on file.   Social History Main Topics  . Smoking status: Current Some Day Smoker    Packs/day: 1.00    Years: 32.00    Types: Cigarettes  . Smokeless tobacco: Never Used  . Alcohol use No  . Drug use: No  . Sexual activity: Yes     Comment: 1st intercourse 89 yo-1 partner   Other Topics Concern  . Not on file   Social History Narrative   Married 1970 ([name] is a patient here). 2 sons. 3 grandchildren. 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 Cancer Mother     larynx  . Heart failure Father   . Heart attack Father 59    smoker, war vet  . Melanoma Father   . Heart attack Brother 40     Donaciano Eva, MD 05/19/2016, 5:15 PM

## 2016-05-18 ENCOUNTER — Ambulatory Visit: Payer: BLUE CROSS/BLUE SHIELD | Attending: Gynecologic Oncology | Admitting: Gynecologic Oncology

## 2016-05-18 ENCOUNTER — Encounter: Payer: Self-pay | Admitting: Gynecologic Oncology

## 2016-05-18 VITALS — BP 128/84 | HR 77 | Temp 97.5°F | Resp 18 | Wt 129.4 lb

## 2016-05-18 DIAGNOSIS — M8580 Other specified disorders of bone density and structure, unspecified site: Secondary | ICD-10-CM | POA: Insufficient documentation

## 2016-05-18 DIAGNOSIS — Z08 Encounter for follow-up examination after completed treatment for malignant neoplasm: Secondary | ICD-10-CM | POA: Diagnosis present

## 2016-05-18 DIAGNOSIS — Z8249 Family history of ischemic heart disease and other diseases of the circulatory system: Secondary | ICD-10-CM | POA: Diagnosis not present

## 2016-05-18 DIAGNOSIS — Z9221 Personal history of antineoplastic chemotherapy: Secondary | ICD-10-CM | POA: Insufficient documentation

## 2016-05-18 DIAGNOSIS — F17210 Nicotine dependence, cigarettes, uncomplicated: Secondary | ICD-10-CM | POA: Insufficient documentation

## 2016-05-18 DIAGNOSIS — Z79899 Other long term (current) drug therapy: Secondary | ICD-10-CM | POA: Insufficient documentation

## 2016-05-18 DIAGNOSIS — C7982 Secondary malignant neoplasm of genital organs: Secondary | ICD-10-CM

## 2016-05-18 DIAGNOSIS — Z923 Personal history of irradiation: Secondary | ICD-10-CM | POA: Diagnosis not present

## 2016-05-18 DIAGNOSIS — E785 Hyperlipidemia, unspecified: Secondary | ICD-10-CM | POA: Diagnosis not present

## 2016-05-18 DIAGNOSIS — Z90710 Acquired absence of both cervix and uterus: Secondary | ICD-10-CM | POA: Diagnosis not present

## 2016-05-18 DIAGNOSIS — Z808 Family history of malignant neoplasm of other organs or systems: Secondary | ICD-10-CM | POA: Diagnosis not present

## 2016-05-18 DIAGNOSIS — Z885 Allergy status to narcotic agent status: Secondary | ICD-10-CM | POA: Diagnosis not present

## 2016-05-18 DIAGNOSIS — C775 Secondary and unspecified malignant neoplasm of intrapelvic lymph nodes: Secondary | ICD-10-CM

## 2016-05-18 DIAGNOSIS — Z8542 Personal history of malignant neoplasm of other parts of uterus: Secondary | ICD-10-CM | POA: Insufficient documentation

## 2016-05-18 DIAGNOSIS — Z90722 Acquired absence of ovaries, bilateral: Secondary | ICD-10-CM | POA: Insufficient documentation

## 2016-05-18 NOTE — Patient Instructions (Signed)
Plan to follow up as scheduled with Dr. Marko Plume, Dr. Sondra Come, and Dr. Denman George in April 2018 or sooner if needed.  Please call for any new symptoms or concerns.

## 2016-05-19 ENCOUNTER — Ambulatory Visit (HOSPITAL_BASED_OUTPATIENT_CLINIC_OR_DEPARTMENT_OTHER): Payer: BLUE CROSS/BLUE SHIELD | Admitting: Oncology

## 2016-05-19 ENCOUNTER — Other Ambulatory Visit (HOSPITAL_BASED_OUTPATIENT_CLINIC_OR_DEPARTMENT_OTHER): Payer: BLUE CROSS/BLUE SHIELD

## 2016-05-19 ENCOUNTER — Encounter: Payer: Self-pay | Admitting: Oncology

## 2016-05-19 VITALS — BP 130/66 | HR 92 | Temp 97.8°F | Resp 18 | Ht 63.0 in | Wt 129.4 lb

## 2016-05-19 DIAGNOSIS — D701 Agranulocytosis secondary to cancer chemotherapy: Secondary | ICD-10-CM

## 2016-05-19 DIAGNOSIS — T451X5A Adverse effect of antineoplastic and immunosuppressive drugs, initial encounter: Secondary | ICD-10-CM

## 2016-05-19 DIAGNOSIS — C541 Malignant neoplasm of endometrium: Secondary | ICD-10-CM

## 2016-05-19 DIAGNOSIS — Z1239 Encounter for other screening for malignant neoplasm of breast: Secondary | ICD-10-CM

## 2016-05-19 DIAGNOSIS — M8580 Other specified disorders of bone density and structure, unspecified site: Secondary | ICD-10-CM | POA: Diagnosis not present

## 2016-05-19 DIAGNOSIS — I878 Other specified disorders of veins: Secondary | ICD-10-CM

## 2016-05-19 DIAGNOSIS — G620 Drug-induced polyneuropathy: Secondary | ICD-10-CM | POA: Diagnosis not present

## 2016-05-19 DIAGNOSIS — Z720 Tobacco use: Secondary | ICD-10-CM

## 2016-05-19 LAB — CBC WITH DIFFERENTIAL/PLATELET
BASO%: 0 % (ref 0.0–2.0)
Basophils Absolute: 0 10*3/uL (ref 0.0–0.1)
EOS%: 1.5 % (ref 0.0–7.0)
Eosinophils Absolute: 0.1 10*3/uL (ref 0.0–0.5)
HCT: 29.3 % — ABNORMAL LOW (ref 34.8–46.6)
HGB: 9.9 g/dL — ABNORMAL LOW (ref 11.6–15.9)
LYMPH%: 30.6 % (ref 14.0–49.7)
MCH: 37.9 pg — ABNORMAL HIGH (ref 25.1–34.0)
MCHC: 33.8 g/dL (ref 31.5–36.0)
MCV: 112.3 fL — ABNORMAL HIGH (ref 79.5–101.0)
MONO#: 0.4 10*3/uL (ref 0.1–0.9)
MONO%: 11.8 % (ref 0.0–14.0)
NEUT#: 1.9 10*3/uL (ref 1.5–6.5)
NEUT%: 56.1 % (ref 38.4–76.8)
PLATELETS: 100 10*3/uL — AB (ref 145–400)
RBC: 2.61 10*6/uL — AB (ref 3.70–5.45)
RDW: 16.2 % — ABNORMAL HIGH (ref 11.2–14.5)
WBC: 3.4 10*3/uL — AB (ref 3.9–10.3)
lymph#: 1 10*3/uL (ref 0.9–3.3)

## 2016-05-19 LAB — COMPREHENSIVE METABOLIC PANEL
ALT: 8 U/L (ref 0–55)
ANION GAP: 10 meq/L (ref 3–11)
AST: 15 U/L (ref 5–34)
Albumin: 3.7 g/dL (ref 3.5–5.0)
Alkaline Phosphatase: 86 U/L (ref 40–150)
BUN: 9.3 mg/dL (ref 7.0–26.0)
CHLORIDE: 107 meq/L (ref 98–109)
CO2: 25 meq/L (ref 22–29)
CREATININE: 0.8 mg/dL (ref 0.6–1.1)
Calcium: 9.7 mg/dL (ref 8.4–10.4)
EGFR: 77 mL/min/{1.73_m2} — ABNORMAL LOW (ref >90)
GLUCOSE: 89 mg/dL (ref 70–140)
Potassium: 3.3 mEq/L — ABNORMAL LOW (ref 3.5–5.1)
SODIUM: 142 meq/L (ref 136–145)
Total Bilirubin: 0.31 mg/dL (ref 0.20–1.20)
Total Protein: 6.7 g/dL (ref 6.4–8.3)

## 2016-05-19 NOTE — Progress Notes (Signed)
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 She had restaging CT AP 05-06-16, which had some probable radiation effects bladder and bowel, but no obvious persistent or recurrent malignancy. She saw Dr Denman George 05-18-16, sees Dr Sondra Come 08-11-16 and will see Dr Denman George 3 months after that appointment.  Patient is progressively feeling better even this close to completing chemotherapy, with improved energy and appetite. Bowels are moving well every other day, which is her usual. No bladder symptoms. Minimal neuropathy now in soles of feet, none in hands, has resumed B vitamins. No bleeding. No abdominal or pelvic discomfort. No LE swelling. No problems with site of previous PICC RUE. No fever or symptoms of infection. Back no longer uncomfortable, with L1-2 disc seen on CT likely aggravated by positioning for radiation previously. Denies increased SOB or cough, still smoking at least 1/2 ppd - discussed.  Remainder of 10 point Review of Systems negative.  PICC placed by IR 03-02-16, DCd with last chemo No genetics testing.  ONCOLOGIC HISTORY Patient presented to Dr Phineas Real as new patient 10-01-15 after noticing recent slight vaginal spotting. PAP had atypical glandular cells, then sonohysterogram and endometrial biopsy on 10-19-15. The uterus was 7.4 x 3.5 x 3.0 cm with endometrial stripe of 6.8 mm. Endometrial biopsy ([id number] from [facility]) showed endometrial carcinoma which appeared high grade with serous features. She was seen by Dr Josephina Shih on 10-23-15, exam not remarkable. She had CT CAP 10-28-15  with no evidence of metastatic disease, incidental atherosclerosis and ectatic abdominal aorta. She had surgery at [facility] by Dr Cindie Laroche on 10-29-15, which was robotic hysterectomy BSO with pelvic and paraaortic sentinel node evaluation. Intraoperative findings were of small uterus with normal tubes and ovaries. Adhesions of bladder due to uterine fundus from prior surgery, bilateral pelvic sentinel pelvic mapping to left obturator space node and to right external iliac node, 3 right paraaortic nodes mapped as sentinel nodes, normal upper abdominal survey. Pathology [id number] had high grade serous adenocarcinoma of uterus involving cervical stroma and outer half of myometrium, isolated tumor cells in 2/2 right external iliac and 2/2 left obturator nodes, negative right and left periaortics, bilateral tubes and ovaries benign. Immunohistochemical stains confirmed serous carcinoma, ER and PR negative. [facility] labs 10-27-15 (preoperative) included CA 125 of 12.1, WBC 10.5, Hgb 14.5 and platelets 290k. [facility] gyn oncology multidisciplinary conference 11-04-15 recommended 6 cycles carboplatin taxol given in sandwich fashion with pelvic radiation and vaginal brachytherapy. First carboplatin taxol given 11-20-15, leukopenic with counts not at nadir day 11 cycle 1, ANC 1.2, given granix x 1. She did not have further gCSF until documented neutropenia day 15 cycle 3 with ANC 0.2. Pelvic IMRT was given in sandwich fashion after first 3 cycles of chemo, completed 02-23-16. Chemo resumed with cycle 4 carbo taxol on 03-03-16. HDR x 3 on 03-08-16, 03-17-16, 03-24-16. Cycle 5 delayed x 1 week with thrombocytopenia, carbo dose decreased then. Cycle 6 given 04-21-16.     Objective:  Vital signs in last 24 hours:  BP 130/66 (BP Location: Right Arm, Patient Position: Sitting)   Pulse 92   Temp 97.8 F (36.6  C) (Oral)   Resp 18   Ht 5' 3"  (1.6 m)   Wt 129 lb 6.4 oz (58.7 kg)   SpO2 100%   BMI 22.92 kg/m  Weight up 3 lbs Alert, oriented  and appropriate. Ambulatory without difficulty, easily able to get on and off exam table. Looks more energetic, very pleasant as always Alopecia  HEENT:PERRL, sclerae not icteric. Oral mucosa moist without lesions, posterior pharynx clear.  Neck supple. No JVD.  Lymphatics:no cervical,supraclavicular or inguinal adenopathy Resp: somewhat diminished BS thruout otherwise clear to auscultation bilaterally and no dullness to percussion bilaterally Cardio: regular rate and rhythm. No gallop. GI: soft, nontender, not distended, no mass or organomegaly. Normally active bowel sounds. Surgical incisions not remarkable. Musculoskeletal/ Extremities: LE without pitting edema, cords, tenderness Neuro: minimal peripheral neuropathy distal feet bilaterally as above. Otherwise nonfocal. PSYCH appropriate mood and affect Skin without rash, ecchymosis, petechiae   Lab Results:  Results for orders placed or performed in visit on 05/19/16  CBC with Differential  Result Value Ref Range   WBC 3.4 (L) 3.9 - 10.3 10e3/uL   NEUT# 1.9 1.5 - 6.5 10e3/uL   HGB 9.9 (L) 11.6 - 15.9 g/dL   HCT 29.3 (L) 34.8 - 46.6 %   Platelets 100 (L) 145 - 400 10e3/uL   MCV 112.3 (H) 79.5 - 101.0 fL   MCH 37.9 (H) 25.1 - 34.0 pg   MCHC 33.8 31.5 - 36.0 g/dL   RBC 2.61 (L) 3.70 - 5.45 10e6/uL   RDW 16.2 (H) 11.2 - 14.5 %   lymph# 1.0 0.9 - 3.3 10e3/uL   MONO# 0.4 0.1 - 0.9 10e3/uL   Eosinophils Absolute 0.1 0.0 - 0.5 10e3/uL   Basophils Absolute 0.0 0.0 - 0.1 10e3/uL   NEUT% 56.1 38.4 - 76.8 %   LYMPH% 30.6 14.0 - 49.7 %   MONO% 11.8 0.0 - 14.0 %   EOS% 1.5 0.0 - 7.0 %   BASO% 0.0 0.0 - 2.0 %  Comprehensive metabolic panel  Result Value Ref Range   Sodium 142 136 - 145 mEq/L   Potassium 3.3 (L) 3.5 - 5.1 mEq/L   Chloride 107 98 - 109 mEq/L   CO2 25 22 - 29 mEq/L   Glucose 89 70 - 140 mg/dl   BUN 9.3 7.0 - 26.0 mg/dL   Creatinine 0.8 0.6 - 1.1 mg/dL   Total Bilirubin 0.31 0.20 - 1.20 mg/dL   Alkaline Phosphatase 86  40 - 150 U/L   AST 15 5 - 34 U/L   ALT 8 0 - 55 U/L   Total Protein 6.7 6.4 - 8.3 g/dL   Albumin 3.7 3.5 - 5.0 g/dL   Calcium 9.7 8.4 - 10.4 mg/dL   Anion Gap 10 3 - 11 mEq/L   EGFR 77 (L) >90 ml/min/1.73 m2     Studies/Results: CT ABDOMEN AND PELVIS WITH CONTRAST  COMPARISON:  10/28/2015  FINDINGS: Lower chest: Unremarkable  Hepatobiliary: Contracted gallbladder.  Otherwise unremarkable  Pancreas: Unremarkable  Spleen: Unremarkable  Adrenals/Urinary Tract: Mild wall thickening in the urinary bladder for the degree of distention. Otherwise unremarkable.  Stomach/Bowel: Mild wall thickening in the sigmoid colon, rectum, and pelvic loops of small bowel, possibly from enterocolitis or radiation related inflammation. Low-level presacral and perirectal stranding and edema tracking along lower pelvic fat planes.  Vascular/Lymphatic: Focal ectasia of the infrarenal abdominal aorta up to 2.9 cm with mural thrombus, image 30/2, similar to prior. Aortoiliac atherosclerotic vascular disease. No pathologic adenopathy.  Reproductive: Uterus  and ovaries absent.  Other: No supplemental non-categorized findings.  Musculoskeletal: Degenerative disc disease with central disc protrusion at the L1-2 level.  IMPRESSION: 1. Mild wall thickening of the urinary bladder and the pelvic loops of small and large bowel, likely from radiation therapy. There is also some accentuated stranding along the pelvic fat planes, again likely a manifestation of local radiation therapy. 2. Stable focal ectasia of the infrarenal abdominal aorta up to 2.9 cm with mural thrombus. Aortoiliac atherosclerotic vascular disease.  PACs images reviewed by MD   Medications: I have reviewed the patient's current medications. Continue B vitamins for neuropathy.  She plans to get flu vaccine at her pharmacy with husband  DISCUSSION  We are pleased that restaging scans looked good from standpoint of  the endometrial cancer and that she is already able to tell improvement from recent chemo. Discussed usual improvement over next several months additionally. I will see her in late Nov including labs. She is to see Dr Sondra Come in Jan and Dr Denman George ~ 3 months later, likely will have primary oncology follow up by those physicians with med onc prn if continues to do well by then. Patient is aware of CT findings of L1-2 disc and probably RT effects seen bowel and bladder at this point.   Flu vaccine as noted.  Again discussed smoking cessation. She is presently smoking ~ 1/2 ppd "I smoke when there is family melodrama". Encouraged setting stop date.   She is due mammograms at [facility], fine to get these now. She reports that she is due colonoscopy, but will talk with Dr Sondra Come about timing of that procedure.  Assessment/Plan:  1.IIIC1 serous high grade endometrial carcinoma with cervical stromal involvement and isolated tumor cells identified in bilateral sentinel pelvic nodes: robotic hysterectomy BSO pelvic and periaortic node evaluation at [facility] 10-29-15, pelvic IMRT completed 02-23-16, vaginal brachytherapy done, adjuvant  chemotherapy completed 6 04-21-16.  Restaging CT no obvious persistent or recurrent malignancy. I will see her with labs in Nov, then likely prn follow up by med onc, with gyn onc and rad onc alternating visits as above. 2.Chemo neutropenia: needed gCSF support with chemo  3.long and ongoing tobacco abuse: she had cut back at one point to 1-3 cigarettes/ 24 hrs. Smoking cessation encouraged, see above 4. Overdue mammogram, last at [facility], ordered now and should have prior to my next visit in Nov 5. Four benign colon polyps at colonoscopy by Dr Henrene Pastor 06-2008: timing of next endoscopy to be discussed with Dr Sondra Come at his next visit 6.osteopenia by bone density scan 09-2015 7.elevated lipids followed by PCP 8.hx BTL 9.radiology evidence of atherosclerosis thoracic and abdominal, and  ectatic abdominal aorta. Recommendation to follow up with [place] in 5 years, cc this information to PCP 10. mild leukocytosis and macrocytosis without anemia at consultation visit 11-13-15: mildly elevated WBC thought post op with no symptoms of infection, macrocytosis slightly increased from a year ago, B12 and folate normal 09-2014, no ETOH. Follow 11.Constipation around initial chemo, recentradiation diarrhea resolved. She has been more careful with oral hydration.  12.restless legs several days after chemo, history of similar problem in years past. Gatorade helpful 13. 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 Time spent 25 min including >50% counseling and coordination of care. CC PCP, Drs Denman George and  Delena Serve, MD   05/19/2016, 8:52 PM

## 2016-05-20 ENCOUNTER — Telehealth: Payer: Self-pay

## 2016-05-20 NOTE — Telephone Encounter (Signed)
S/w pt to increase K+ in diet. Gave her some suggestions. She said she will google it.

## 2016-05-20 NOTE — Telephone Encounter (Signed)
-----   Message from Gordy Levan, MD sent at 05/20/2016 12:53 PM EDT ----- Labs seen and need follow up: K a little low yesterday - please have her increase in diet

## 2016-05-22 ENCOUNTER — Encounter: Payer: Self-pay | Admitting: Oncology

## 2016-05-22 NOTE — Progress Notes (Signed)
[place] END OF TREATMENT   Name: KAOIR COURCHESNE Date: May 22, 2016  MRN: [id number] DOB: [birth year]   TREATMENT DATES: 11-20-15 thru 04-21-16   REFERRING PHYSICIAN: Terrence Dupont Rossi/ Cindie Laroche  DIAGNOSIS: high grade serous carcinoma of endometrium   STAGE AT START OF TREATMENT: IIIC1   INTENT: curative   DRUGS OR REGIMENS GIVEN: carboplatin taxol   MAJOR TOXICITIES: chemo neutropenia, minimal peripheral neuropathy, fatigue, difficult peripheral IV access requiring PICC to complete chemo   REASON TREATMENT STOPPED: completion of planned course   PERFORMANCE STATUS AT END: ECOG 1   ONGOING PROBLEMS: deconditioning, minimal peripheral neuropathy   FOLLOW UP PLANS: restaging scans, follow up with gyn oncology

## 2016-06-01 ENCOUNTER — Encounter: Payer: Self-pay | Admitting: Family Medicine

## 2016-06-01 LAB — HM MAMMOGRAPHY

## 2016-06-02 ENCOUNTER — Encounter: Payer: Self-pay | Admitting: Family Medicine

## 2016-06-06 ENCOUNTER — Encounter: Payer: Self-pay | Admitting: Oncology

## 2016-06-06 NOTE — Progress Notes (Signed)
Medical Oncology  Report received from [facility] for mammograms done 06-01-16 , with calcifications in right breast upper outer quadrant new from prior mammograms 04-09-2007 Right stereotactic biopsy to be scheduled by [facility].  Report to be scanned into this EMR.  Godfrey Pick, MD

## 2016-06-08 ENCOUNTER — Encounter: Payer: Self-pay | Admitting: Oncology

## 2016-06-08 ENCOUNTER — Other Ambulatory Visit: Payer: Self-pay | Admitting: Radiology

## 2016-06-10 ENCOUNTER — Encounter: Payer: Self-pay | Admitting: Family Medicine

## 2016-06-16 ENCOUNTER — Encounter: Payer: Self-pay | Admitting: Radiation Oncology

## 2016-06-16 NOTE — Progress Notes (Signed)
  Radiation Oncology         [phone] ________________________________  Name: Jacqueline Hammond MRN: [id number]  Date: 06/16/2016  DOB: September 15, 1950  End of Treatment Note  Diagnosis:  Stage IIIC1 high grade serous carcinoma of endometrium    Indication for treatment:  Curative      Radiation treatment dates: 1) 01/19/16-02/23/16     2) 03/08/16, 03/17/16, 03/24/16  Site/dose:   1) Pelvis/ 45 Gy in 25 fx   2)Vaginal cuff/ 18 Gy in 3 fx  Beams/energy:   1)IMRT/ 6X    2)HDR-vaginal/ Iridium HDR  Narrative: The patient tolerated radiation treatment relatively well.   She denied pain, bladder/bowel issues, or vaginal/rectal bleeding during treatment.  Plan: The patient has completed radiation treatment. The patient will return to radiation oncology clinic for routine followup in one month. I advised them to call or return sooner if they have any questions or concerns related to their recovery or treatment.  -----------------------------------  Jacqueline Promise, PhD, MD  This document serves as a record of services personally performed by Gery Pray, MD. It was created on his behalf by Bethann Humble, a trained medical scribe. The creation of this record is based on the scribe's personal observations and the provider's statements to them. This document has been checked and approved by the attending provider.

## 2016-06-20 ENCOUNTER — Other Ambulatory Visit: Payer: Self-pay

## 2016-06-20 DIAGNOSIS — C541 Malignant neoplasm of endometrium: Secondary | ICD-10-CM

## 2016-06-20 MED ORDER — LORAZEPAM 0.5 MG PO TABS: ORAL_TABLET | ORAL | 0 refills | Status: DC

## 2016-06-21 NOTE — Telephone Encounter (Signed)
LM for Jacqueline Hammond that the Ativan was refilled for # 10 tablets. If she needs any further refills, she would need to contact PCP.

## 2016-06-26 ENCOUNTER — Other Ambulatory Visit: Payer: Self-pay | Admitting: Oncology

## 2016-06-30 ENCOUNTER — Other Ambulatory Visit (HOSPITAL_BASED_OUTPATIENT_CLINIC_OR_DEPARTMENT_OTHER): Payer: BLUE CROSS/BLUE SHIELD

## 2016-06-30 ENCOUNTER — Ambulatory Visit (HOSPITAL_BASED_OUTPATIENT_CLINIC_OR_DEPARTMENT_OTHER): Payer: BLUE CROSS/BLUE SHIELD | Admitting: Oncology

## 2016-06-30 VITALS — BP 113/75 | HR 85 | Temp 98.0°F | Resp 18 | Ht 63.0 in | Wt 127.1 lb

## 2016-06-30 DIAGNOSIS — G620 Drug-induced polyneuropathy: Secondary | ICD-10-CM

## 2016-06-30 DIAGNOSIS — D701 Agranulocytosis secondary to cancer chemotherapy: Secondary | ICD-10-CM | POA: Diagnosis not present

## 2016-06-30 DIAGNOSIS — Z720 Tobacco use: Secondary | ICD-10-CM

## 2016-06-30 DIAGNOSIS — I878 Other specified disorders of veins: Secondary | ICD-10-CM

## 2016-06-30 DIAGNOSIS — T451X5A Adverse effect of antineoplastic and immunosuppressive drugs, initial encounter: Secondary | ICD-10-CM

## 2016-06-30 DIAGNOSIS — C541 Malignant neoplasm of endometrium: Secondary | ICD-10-CM | POA: Diagnosis not present

## 2016-06-30 DIAGNOSIS — Z23 Encounter for immunization: Secondary | ICD-10-CM | POA: Diagnosis not present

## 2016-06-30 DIAGNOSIS — M8580 Other specified disorders of bone density and structure, unspecified site: Secondary | ICD-10-CM

## 2016-06-30 LAB — COMPREHENSIVE METABOLIC PANEL
ALBUMIN: 3.6 g/dL (ref 3.5–5.0)
ALK PHOS: 88 U/L (ref 40–150)
ALT: 12 U/L (ref 0–55)
ANION GAP: 9 meq/L (ref 3–11)
AST: 17 U/L (ref 5–34)
BILIRUBIN TOTAL: 0.31 mg/dL (ref 0.20–1.20)
BUN: 8.7 mg/dL (ref 7.0–26.0)
CALCIUM: 10 mg/dL (ref 8.4–10.4)
CO2: 28 meq/L (ref 22–29)
CREATININE: 0.8 mg/dL (ref 0.6–1.1)
Chloride: 105 mEq/L (ref 98–109)
EGFR: 73 mL/min/{1.73_m2} — AB (ref >90)
Glucose: 112 mg/dl (ref 70–140)
Potassium: 3.4 mEq/L — ABNORMAL LOW (ref 3.5–5.1)
Sodium: 142 mEq/L (ref 136–145)
TOTAL PROTEIN: 6.7 g/dL (ref 6.4–8.3)

## 2016-06-30 LAB — CBC WITH DIFFERENTIAL/PLATELET
BASO%: 0.4 % (ref 0.0–2.0)
Basophils Absolute: 0 10*3/uL (ref 0.0–0.1)
EOS ABS: 0.2 10*3/uL (ref 0.0–0.5)
EOS%: 1.9 % (ref 0.0–7.0)
HEMATOCRIT: 34.7 % — AB (ref 34.8–46.6)
HEMOGLOBIN: 11.5 g/dL — AB (ref 11.6–15.9)
LYMPH#: 1.7 10*3/uL (ref 0.9–3.3)
LYMPH%: 21.4 % (ref 14.0–49.7)
MCH: 38 pg — ABNORMAL HIGH (ref 25.1–34.0)
MCHC: 33.1 g/dL (ref 31.5–36.0)
MCV: 114.5 fL — AB (ref 79.5–101.0)
MONO#: 0.5 10*3/uL (ref 0.1–0.9)
MONO%: 6.3 % (ref 0.0–14.0)
NEUT%: 70 % (ref 38.4–76.8)
NEUTROS ABS: 5.5 10*3/uL (ref 1.5–6.5)
PLATELETS: 275 10*3/uL (ref 145–400)
RBC: 3.03 10*6/uL — AB (ref 3.70–5.45)
RDW: 12.9 % (ref 11.2–14.5)
WBC: 7.9 10*3/uL (ref 3.9–10.3)

## 2016-06-30 MED ORDER — INFLUENZA VAC SPLIT QUAD 0.5 ML IM SUSY
0.5000 mL | PREFILLED_SYRINGE | Freq: Once | INTRAMUSCULAR | Status: AC
  Administered 2016-06-30: 0.5 mL via INTRAMUSCULAR
  Filled 2016-06-30: qty 0.5

## 2016-06-30 NOTE — Progress Notes (Signed)
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 She received 6 cycles of carboplatin taxol from 11-20-15 thru 04-21-16, in sandwich fashion with pelvic IMRT and with vaginal brachytherapy. CT AP 05-06-16 showed no apparent residual or progressive malignancy; mild wall thickening of bladder and pelvic bowel loops noted consistent with radiation. She saw Dr Denman George on 05-18-16 and will alternate visits with gyn oncology and radiation oncology, scheduled to see Dr Sondra Come on 08-11-16 and Dr Denman George [phone].  Patient had mammograms at [facility] 05-30-16, with extremely dense breast tissue and mass with calcifications upper outer right breast. She had biopsy right breast by Dr Isaiah Blakes 06-08-16 with benign findings [id number], fibrocystic changes with adenosis and calcifications, and fibroadenoma with calcifications). Next mammogram due 1 year.   Patient has felt progressively much stronger and more energetic as she gets further out from treatment, tho still not back to full baseline energy. Peripheral neuropathy has resolved in hands and is much better in feet, now only notices neuropathy in feet intermittently. She hosted 44 family at her house last weekend. Appetite is good, bowels moving regularly, no abdominal or pelvic discomfort, bladder ok. No new or different pain. Denies increased SOB or cough. She has decreased smoking to average of 3 cigarettes/ 24 hrs, mostly just after meals. No swelling LE. No pain. No fever or symptoms of infection. She cannot feel any changes in breasts.  Remainder of 10 point Review of Systems negative.  Flu vaccine 06-30-16 PICC  Used for completion of chemo, DCd with last treatment No genetics testing. No MSI  testing on initial endometrial biopsy done in [facility]. Surgical path from [facility] 10-29-15 does not have this information that I can locate in [place]. ER PR negative on [facility] surgical path 10-29-15    ONCOLOGIC HISTORY Patient presented to Dr Phineas Real as new patient 10-01-15 after noticing recent slight vaginal spotting. PAP had atypical glandular cells, then sonohysterogram and endometrial biopsy on 10-19-15. The uterus was 7.4 x 3.5 x 3.0 cm with endometrial stripe of 6.8 mm. Endometrial biopsy ([id number] from [facility]) showed endometrial carcinoma which appeared high grade with serous features. She was seen by Dr Josephina Shih on 10-23-15, exam not remarkable. She had CT CAP 10-28-15 with no evidence of metastatic disease, incidental atherosclerosis and ectatic abdominal aorta. She had surgery at [facility] by Dr Cindie Laroche on 10-29-15, which was robotic hysterectomy BSO with pelvic and paraaortic sentinel node evaluation. Intraoperative findings were of small uterus with normal tubes and ovaries. Adhesions of bladder due to uterine fundus from prior surgery, bilateral pelvic sentinel pelvic mapping to left obturator space node and to right external iliac node, 3 right paraaortic nodes mapped as sentinel nodes, normal upper abdominal survey. Pathology [phone] had high grade serous adenocarcinoma of uterus involving cervical stroma and outer half of myometrium, isolated tumor cells in 2/2 right external iliac and 2/2 left obturator nodes, negative right and left periaortics, bilateral tubes and ovaries benign. Immunohistochemical stains confirmed serous carcinoma, ER and PR negative. [facility] labs 10-27-15 (preoperative) included CA 125 of 12.1, WBC 10.5, Hgb 14.5 and platelets 290k. [facility] gyn oncology multidisciplinary conference 11-04-15 recommended 6 cycles carboplatin taxol given in sandwich fashion with pelvic radiation and vaginal brachytherapy. First carboplatin taxol given  11-20-15, leukopenic with  counts not at nadir day 11 cycle 1, ANC 1.2, given granix x 1. She did not have further gCSF until documented neutropenia day 15 cycle 3 with ANC 0.2. Pelvic IMRT was given in sandwich fashion after first 3 cycles of chemo, completed 02-23-16; she additionally had vaginal brachytherapy. Chemo resumed with cycle 4 carbo taxol on 03-03-16. HDR x 3 on 03-08-16, 03-17-16, 03-24-16. Cycle 5 delayed x 1 week with thrombocytopenia, carbo dose decreased then. Cycle 6 given 04-21-16. CT AP 05-06-16 showed no apparent residual or progressive malignancy; mild wall thickening of bladder and pelvic bowel loops noted consistent with radiation.   Objective:  Vital signs in last 24 hours:  BP 113/75 (BP Location: Left Arm, Patient Position: Sitting)   Pulse 85   Temp 98 F (36.7 C) (Oral)   Resp 18   Ht 5' 3"  (1.6 m)   Wt 127 lb 1.6 oz (57.7 kg)   SpO2 98%   BMI 22.51 kg/m  Weight down 2 lbs Alert, oriented and appropriate. Ambulatory quickly without difficulty.  Hair is growing back  HEENT:PERRL, sclerae not icteric. Oral mucosa moist without lesions, posterior pharynx clear.  Neck supple. No JVD.  Lymphatics:no cervical,supraclavicular, axillary or inguinal adenopathy Resp: somewhat diminished BS thruout otherwise clear to auscultation bilaterally and no dullness to  percussion bilaterally Cardio: regular rate and rhythm. No gallop. GI: soft, nontender, not distended, no mass or organomegaly. Normally active bowel sounds. Surgical incisions not remarkable. Musculoskeletal/ Extremities: without pitting edema, cords, tenderness Neuro: slight peripheral neuropathy soles of feet. Otherwise nonfocal. PSYCH appropriate mood and affect Skin without rash, ecchymosis, petechiae Breasts: bilaterally without dominant mass, skin or nipple findings. Axillae benign.   Lab Results:  Results for orders placed or performed in visit on 06/30/16  CBC with Differential  Result Value Ref Range   WBC 7.9 3.9 - 10.3 10e3/uL    NEUT# 5.5 1.5 - 6.5 10e3/uL   HGB 11.5 (L) 11.6 - 15.9 g/dL   HCT 34.7 (L) 34.8 - 46.6 %   Platelets 275 145 - 400 10e3/uL   MCV 114.5 (H) 79.5 - 101.0 fL   MCH 38.0 (H) 25.1 - 34.0 pg   MCHC 33.1 31.5 - 36.0 g/dL   RBC 3.03 (L) 3.70 - 5.45 10e6/uL   RDW 12.9 11.2 - 14.5 %   lymph# 1.7 0.9 - 3.3 10e3/uL   MONO# 0.5 0.1 - 0.9 10e3/uL   Eosinophils Absolute 0.2 0.0 - 0.5 10e3/uL   Basophils Absolute 0.0 0.0 - 0.1 10e3/uL   NEUT% 70.0 38.4 - 76.8 %   LYMPH% 21.4 14.0 - 49.7 %   MONO% 6.3 0.0 - 14.0 %   EOS% 1.9 0.0 - 7.0 %   BASO% 0.4 0.0 - 2.0 %  Comprehensive metabolic panel  Result Value Ref Range   Sodium 142 136 - 145 mEq/L   Potassium 3.4 (L) 3.5 - 5.1 mEq/L   Chloride 105 98 - 109 mEq/L   CO2 28 22 - 29 mEq/L   Glucose 112 70 - 140 mg/dl   BUN 8.7 7.0 - 26.0 mg/dL   Creatinine 0.8 0.6 - 1.1 mg/dL   Total Bilirubin 0.31 0.20 - 1.20 mg/dL   Alkaline Phosphatase 88 40 - 150 U/L   AST 17 5 - 34 U/L   ALT 12 0 - 55 U/L   Total Protein 6.7 6.4 - 8.3 g/dL   Albumin 3.6 3.5 - 5.0 g/dL   Calcium 10.0 8.4 - 10.4 mg/dL  Anion Gap 9 3 - 11 mEq/L   EGFR 73 (L) >90 ml/min/1.73 m2     Studies/Results:  EXAM: CT ABDOMEN AND PELVIS WITH CONTRAST  TECHNIQUE: Multidetector CT imaging of the abdomen and pelvis was performed using the standard protocol following bolus administration of intravenous contrast.  CONTRAST:  17m ISOVUE-300 IOPAMIDOL (ISOVUE-300) INJECTION 61%  COMPARISON:  10/28/2015  FINDINGS: Lower chest: Unremarkable  Hepatobiliary: Contracted gallbladder.  Otherwise unremarkable  Pancreas: Unremarkable  Spleen: Unremarkable  Adrenals/Urinary Tract: Mild wall thickening in the urinary bladder for the degree of distention. Otherwise unremarkable.  Stomach/Bowel: Mild wall thickening in the sigmoid colon, rectum, and pelvic loops of small bowel, possibly from enterocolitis or radiation related inflammation. Low-level presacral and  perirectal stranding and edema tracking along lower pelvic fat planes.  Vascular/Lymphatic: Focal ectasia of the infrarenal abdominal aorta up to 2.9 cm with mural thrombus, image 30/2, similar to prior. Aortoiliac atherosclerotic vascular disease. No pathologic adenopathy.  Reproductive: Uterus and ovaries absent.  Other: No supplemental non-categorized findings.  Musculoskeletal: Degenerative disc disease with central disc protrusion at the L1-2 level.  IMPRESSION: 1. Mild wall thickening of the urinary bladder and the pelvic loops of small and large bowel, likely from radiation therapy. There is also some accentuated stranding along the pelvic fat planes, again likely a manifestation of local radiation therapy. 2. Stable focal ectasia of the infrarenal abdominal aorta up to 2.9 cm with mural thrombus. Aortoiliac atherosclerotic vascular disease.    [facility]. Additional [facility] imaging  right breast 11-1 and 06-08-16 are in this EMR  BREAST BIOPSY 06-08-16 FINAL for RANETTE, BARRA(([id number] Patient: Jacqueline Hammond, RIEFCollected: 06/08/2016 [facility] [email or web address] Accession: [id number]: 06/08/2016 MJohnnette Gourd MD REPORT OF SURGICAL PATHOLOGY FINAL DIAGNOSIS Diagnosis Breast, right, needle core biopsy - FIBROCYSTIC CHANGES WITH ADENOSIS AND CALCIFICATIONS. - FIBROADENOMA WITH CALCIFICATIONS. - THERE IS NO EVIDENCE OF MALIGNANCY.     Medications: I have reviewed the patient's current medications. She will increase potassium in diet in preference to adding potassium tablet. Flu vaccine today She will continue iron po as tolerates, presently taking in MVI + some additional  DISCUSSION  Clinically doing well and is pleased to be feeling so much better. Hopefully peripheral neuropathy in feet will improve further, but is not bothersome now.   She has labs  done by PCP with physical exam, due ~ 09-2016, which would be a good time to recheck CBC and CMET.  Smoking cessation reviewed as she is now down to 3 cigarettes / 24 hrs. She is adjusting activities to decrease smoking, and I have suggested nicotine gum as worst time is after meals. Message sent to thoracic oncology nurse navigator to let patient know available smoking cessation resources; that RN had spoken with patient also a few months ago.  She will follow up with gyn oncology and radiation oncology, alternating visits. Labs with PCP in ~ 4 months as above. She does not need scheduled follow up with medical oncology, tho can certainly be seen back at any time if needed. Patient is aware that another medical oncologist will be working with gyn oncology team after first of year.    Assessment/Plan:  1.IIIC1 serous high grade endometrial carcinoma with cervical stromal involvement and isolated tumor cells identified in bilateral sentinel pelvic nodes: [facility], pelvic IMRT completed 02-23-16, vaginal brachytherapy done, adjuvant  chemotherapy completed 6 04-21-16.  Restaging CT no obvious persistent or recurrent malignancy.  She will see Dr Sondra Come in Jan and Dr Denman George in March. She can see medical oncology prn, is aware that there will be another medical oncologist working with gyn oncology patients next year.  2.Chemo neutropenia: needed gCSF support with chemo, resolved.  3.long and ongoing tobacco abuse: she has decreased from  1/2 ppd to ~ 3 cigarettes in 24 hrs now. Smoking cessation discussed again and encouraged. 4. Mammograms done at [facility] 05-30-16, subsequent right breast biopsy benign. Year follow up. Have requested the bilateral mammogram report for this EMR. Dense breast tissue noted, tomo mammograms likely will give best imaging. 5. Four benign colon polyps at colonoscopy by Dr Henrene Pastor 06-2008: timing of next endoscopy to  be discussed with Dr Sondra Come at his next visit. Did have radiation diarrhea, note some radiation bowel effects on CT 05-2016. 6.osteopenia by bone density scan 09-2015 7.elevated lipids followed by PCP 8.hx BTL 9.radiology evidence of atherosclerosis thoracic and abdominal, and ectatic abdominal aorta. Recommendation to follow up with [place] in 5 years, cc this information to PCP 10. mild leukocytosis and macrocytosis without anemia  11-13-15: mildly elevated WBC thought post op with no symptoms of infection, macrocytosis slightly increased from a year ago, B12 and folate normal 09-2014, no ETOH.  WBC and platelets now normal, hemoglobin up to 11.5 with some iron supplement. Follow up labs by PCP with PE ~ 09-2016 11. Difficult peripheral IV access, PICC needed to complete chemo, now [facility].  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  All questions answered and she knows to contact physician if concerns prior to follow up visits as above. Time spent 25 min including >50% counseling and coordination of care. Cc Drs Alleen Borne, MD   06/30/2016, 3:13 PM

## 2016-07-02 ENCOUNTER — Encounter: Payer: Self-pay | Admitting: Oncology

## 2016-07-05 ENCOUNTER — Telehealth: Payer: Self-pay

## 2016-07-05 NOTE — Telephone Encounter (Signed)
Received a copy of Mammogram report from 05-30-16.  Sent to HIM to be scanned into patient's EMR as Dr. Marko Plume requested this report.

## 2016-08-10 ENCOUNTER — Encounter: Payer: Self-pay | Admitting: Oncology

## 2016-08-11 ENCOUNTER — Encounter: Payer: Self-pay | Admitting: Radiation Oncology

## 2016-08-11 ENCOUNTER — Ambulatory Visit
Admission: RE | Admit: 2016-08-11 | Discharge: 2016-08-11 | Disposition: A | Discharge status: Home / Self Care | Payer: BLUE CROSS/BLUE SHIELD | Source: Ambulatory Visit | Attending: Radiation Oncology | Admitting: Radiation Oncology

## 2016-08-11 DIAGNOSIS — Z923 Personal history of irradiation: Secondary | ICD-10-CM | POA: Diagnosis not present

## 2016-08-11 DIAGNOSIS — Z08 Encounter for follow-up examination after completed treatment for malignant neoplasm: Secondary | ICD-10-CM | POA: Insufficient documentation

## 2016-08-11 DIAGNOSIS — Z79899 Other long term (current) drug therapy: Secondary | ICD-10-CM | POA: Insufficient documentation

## 2016-08-11 DIAGNOSIS — R197 Diarrhea, unspecified: Secondary | ICD-10-CM | POA: Diagnosis not present

## 2016-08-11 DIAGNOSIS — Z885 Allergy status to narcotic agent status: Secondary | ICD-10-CM | POA: Insufficient documentation

## 2016-08-11 DIAGNOSIS — C541 Malignant neoplasm of endometrium: Secondary | ICD-10-CM

## 2016-08-11 DIAGNOSIS — K5900 Constipation, unspecified: Secondary | ICD-10-CM | POA: Diagnosis not present

## 2016-08-11 DIAGNOSIS — Z8542 Personal history of malignant neoplasm of other parts of uterus: Secondary | ICD-10-CM | POA: Diagnosis not present

## 2016-08-11 NOTE — Progress Notes (Signed)
Jacqueline Hammond is here for follow up.  She denies having pain or bladder issues.  She reports alternating between having diarrhea and constipation.  She said she is due for a colonoscopy.  She denies having any vaginal bleeding.  She said her energy level is good and she went to a Zumba class for the first time yesterday.  She reports using her dilator occasionally.  BP 123/86 (BP Location: Left Arm, Patient Position: Sitting)   Pulse 97   Temp 97.9 F (36.6 C) (Oral)   Ht 5\' 3"  (1.6 m)   Wt 124 lb 6.4 oz (56.4 kg)   SpO2 100%   BMI 22.04 kg/m    Wt Readings from Last 3 Encounters:  08/11/16 124 lb 6.4 oz (56.4 kg)  06/30/16 127 lb 1.6 oz (57.7 kg)  05/19/16 129 lb 6.4 oz (58.7 kg)

## 2016-08-11 NOTE — Progress Notes (Signed)
Radiation Oncology         [phone]) [phone] ________________________________  Name: Jacqueline Hammond MRN: [id number]  Date: 08/11/2016  DOB: 22-Mar-1951  Follow-Up Visit Note  CC: [name], MD  Dorothyann Gibbs, NP    ICD-9-CM ICD-10-CM   1. Endometrial cancer, FIGO stage IIIC (HCC) 182.0 C54.1     Diagnosis:   Stage IIIC1 high grade serous carcinoma of endometrium    Interval Since Last Radiation:  5 months  1) 01/19/16-02/23/16: Pelvis/ 45 Gy in 25 fx 2) 03/08/16, 03/17/16, 03/24/16: Vaginal cuff brachytherapy/ 18 Gy in 3 fx  Narrative:  The patient returns today for routine follow-up of radiation completed to the Pelvis and Vaginal Cuff.  On review of systems, the patient denies pain. She denies bladder concerns. The patient does reports alternating between having diarrhea and constipation. She denies vaginal bleeding. She reports a good energy level. She reports using her vaginal dilator occasionally. She also reports she is due for a colonoscopy, and asks if it is okay for her to schedule this. She is accompanied by her husband.         ALLERGIES:  is allergic to codeine sulfate and neomycin-bacitracin zn-polymyx.  Meds: Current Outpatient Prescriptions  Medication Sig Dispense Refill  . atorvastatin (LIPITOR) 20 MG tablet TAKE 1 TABLET BY MOUTH EVERY DAY (Patient not taking: Reported on 08/11/2016) 30 tablet 5  . LORazepam (ATIVAN) 0.5 MG tablet Place 1 tablet under the tongue or swallow every 6 hrs as needed for nausea.  Will make Drowsy. (Patient not taking: Reported on 08/11/2016) 10 tablet 0  . temazepam (RESTORIL) 15 MG capsule Take 1 capsule (15 mg total) by mouth at bedtime as needed. for sleep (Patient not taking: Reported on 08/11/2016) 20 capsule 0   No current facility-administered medications for this encounter.     Physical Findings: The patient is in no acute distress. Patient is alert and oriented.  height is 5\' 3"  (1.6 m) and weight is 124 lb 6.4 oz (56.4  kg). Her oral temperature is 97.9 F (36.6 C). Her blood pressure is 123/86 and her pulse is 97. Her oxygen saturation is 100%.  No significant changes. In general this is a well appearing Caucasian female in no acute distress. She's alert and oriented x4 and appropriate throughout the examination. Cardiopulmonary assessment is negative for acute distress and she exhibits normal effort. No lymph adenopathy. Abdomen is soft, nontender, with normal active bowel sounds. On pelvic examination the external genitalia were unremarkable. A speculum exam was performed. There are no mucosal lesions noted in the vaginal vault. On bimanual and rectovaginal examination there were no pelvic masses appreciated.  Lab Findings: Lab Results  Component Value Date   WBC 7.9 06/30/2016   HGB 11.5 (L) 06/30/2016   HCT 34.7 (L) 06/30/2016   MCV 114.5 (H) 06/30/2016   PLT 275 06/30/2016    Radiographic Findings: No results found.  Impression:  The patient is recovering from the effects of radiation. No evidence of disease recurrence at this time.  Plan:  The patient will follow up with Dr. Denman George in 3 months, and follow up me in 6 months. I advised her that she could schedule her colonoscopy as indicated by her PCP. The patient will contact me in the interim with any questions or concerns that may arise.  ____________________________________  Blair Promise, PhD, MD  This document serves as a record of services personally performed by Gery Pray, MD. It was created on his behalf  by Maryla Morrow, a trained medical scribe. The creation of this record is based on the scribe's personal observations and the provider's statements to them. This document has been checked and approved by the attending provider.

## 2016-09-02 ENCOUNTER — Telehealth: Payer: Self-pay

## 2016-09-02 ENCOUNTER — Other Ambulatory Visit: Payer: Self-pay | Admitting: Family Medicine

## 2016-09-02 NOTE — Telephone Encounter (Signed)
Faxed sign Medical release form to the [facility]. Sent a copy to HIM to be scanned into patient's emr.

## 2016-09-23 ENCOUNTER — Encounter: Payer: Self-pay | Admitting: Family Medicine

## 2016-09-23 ENCOUNTER — Ambulatory Visit (INDEPENDENT_AMBULATORY_CARE_PROVIDER_SITE_OTHER): Payer: BLUE CROSS/BLUE SHIELD | Admitting: Family Medicine

## 2016-09-23 VITALS — BP 110/76 | HR 99 | Temp 97.8°F | Ht 63.0 in | Wt 125.6 lb

## 2016-09-23 DIAGNOSIS — E785 Hyperlipidemia, unspecified: Secondary | ICD-10-CM

## 2016-09-23 DIAGNOSIS — C541 Malignant neoplasm of endometrium: Secondary | ICD-10-CM | POA: Diagnosis not present

## 2016-09-23 DIAGNOSIS — Z0000 Encounter for general adult medical examination without abnormal findings: Secondary | ICD-10-CM

## 2016-09-23 DIAGNOSIS — G4700 Insomnia, unspecified: Secondary | ICD-10-CM | POA: Diagnosis not present

## 2016-09-23 MED ORDER — TRAZODONE HCL 50 MG PO TABS: 25.0000 mg | ORAL_TABLET | Freq: Every evening | ORAL | 3 refills | Status: DC | PRN

## 2016-09-23 NOTE — Assessment & Plan Note (Signed)
Sleeping 3-4 hours a night at worst. Takes [facility] sleep aid after 3-4 nights. Did do temazepam 15mg  and ativan 0.5mg  during cancer treatment- may trial those at next step. But will start with trazodone. She can let me know if wants to trial other- I am willing. She only wants to use every 3-4 nights

## 2016-09-23 NOTE — Progress Notes (Signed)
Pre visit review using our clinic review tool, if applicable. No additional management support is needed unless otherwise documented below in the visit note. 

## 2016-09-23 NOTE — Assessment & Plan Note (Signed)
off atorvastatin during treatment- update labs and consider restart

## 2016-09-23 NOTE — Assessment & Plan Note (Signed)
Per patient : "Robotic, 3 chemotherapy sessions, 25 radiation sessions, 3 internal radiation sessions, 3 chemo to finish. " she is doing phenomenally well. Mild neuropathy numbness/tingling in feet getting slowly better- thought to be due to chemotherapy

## 2016-09-23 NOTE — Patient Instructions (Addendum)
Trial trazodone for sleep. Can try temazepam as next step if not effective.   You are doing fantastic  Do want you to swing back by for labs  Schedule a lab visit at the check out desk within 2 weeks. Return for future fasting labs meaning nothing but water after midnight please. Ok to take your medications with water.

## 2016-09-23 NOTE — Progress Notes (Signed)
Phone: [phone]  Subjective:  Patient presents today for their annual physical. Chief complaint-noted.   See problem oriented charting- ROS- full  review of systems was completed and negative except for: numbness/tingling in bottom of feet- if active doesn't really notice it  The following were reviewed and entered/updated in epic: Past Medical History:  Diagnosis Date  . Adenocarcinoma of endometrium ([place]) 09/2015  . Atypical glandular cells of undetermined significance (AGUS) on cervical Pap smear 09/2015  . History of radiation therapy 01/19/16-03/24/16   pelvis 45 Gy in 25 fx, vaginal cuff 18 Gy in 3 fx  . Hyperlipidemia   . Osteopenia 09/2015   T score -1.4 FRAX 8.5%/0.8%   Patient Active Problem List   Diagnosis Date Noted  . Endometrial cancer, FIGO stage IIIC ([place]) 11/09/2015    Priority: High  . Ectatic abdominal aorta ([place]) 11/13/2015    Priority: Medium  . Hyperlipidemia 05/08/2008    Priority: Medium  . Family history of melanoma 10/10/2014    Priority: Low  . Former smoker 10/10/2014    Priority: Low  . Insomnia 10/10/2014    Priority: Low  . Hematuria 12/04/2012    Priority: Low  . Chemotherapy-induced peripheral neuropathy ([place]) 04/09/2016  . Chemotherapy induced neutropenia ([place]) 01/15/2016  . Restless legs 12/18/2015  . Constipation 11/28/2015  . Continuous tobacco abuse 11/13/2015  . Macrocytosis without anemia 11/13/2015  . Osteopenia determined by x-ray 11/13/2015  . Hx of adenomatous colonic polyps 11/13/2015  . Secondary malignant neoplasm of cervix ([place]) 11/09/2015  . 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 IR GENERIC HISTORICAL  03/02/2016   IR US GUIDE VASC ACCESS RIGHT 03/02/2016 Arne Cleveland, MD [facility]  . IR GENERIC HISTORICAL  03/02/2016   IR FLUORO GUIDE CV LINE RIGHT 03/02/2016 Arne Cleveland, MD [facility]  . LAPAROSCOPY    . ROBOTIC ASSISTED TOTAL  HYSTERECTOMY WITH BILATERAL SALPINGO OOPHERECTOMY  10/29/15   robotic hysterectomy, BSO and sentinel lymph node biopsy of pelvic and PA nodes at [facility] with Dr Clarene Essex.   . TUBAL LIGATION      Family History  Problem Relation Age of Onset  . Cancer Mother     larynx  . Heart failure Father   . Heart attack Father 70    smoker, war vet  . Melanoma Father   . Heart attack Brother 40    Medications- reviewed and updated Current Outpatient Prescriptions  Medication Sig Dispense Refill  . atorvastatin (LIPITOR) 20 MG tablet TAKE 1 TABLET BY MOUTH EVERY DAY (Patient not taking: Reported on 09/23/2016) 30 tablet 5  . traZODone (DESYREL) 50 MG tablet Take 0.5-1 tablets (25-50 mg total) by mouth at bedtime as needed for sleep. 30 tablet 3   No current facility-administered medications for this visit.     Allergies-reviewed and updated Allergies  Allergen Reactions  . Codeine Sulfate     REACTION: nausea, pass out  . Neomycin-Bacitracin Zn-Polymyx     REACTION: redness    Social History   Social History  . Marital status: Married    Spouse name: N/A  . Number of children: 2  . Years of education: N/A   Social History Main Topics  . Smoking status: Current Some Day Smoker    Packs/day: 1.00    Years: 32.00    Types: Cigarettes  . Smokeless tobacco: Never Used     Comment: 3 cigarettes/ day as of 06-30-16  .  Alcohol use No  . Drug use: No  . Sexual activity: Yes     Comment: 1st intercourse 22 yo-1 partner   Other Topics Concern  . None   Social History Narrative   Married 1970 ([name] is a patient here). 2 sons. 3 grandchildren. 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 Oropharynx normal Neck: no thyromegaly CV: RRR no murmurs rubs or gallops Lungs: CTAB no crackles, wheeze, rhonchi Abdomen: soft/nontender/nondistended/normal bowel sounds. No rebound or guarding.  Ext: no edema Skin: warm, dry Neuro: grossly normal, moves all extremities, PERRLA  Breast and GYn exam declined for GYN  Assessment/Plan:  66 y.o. female presenting for annual physical.  Health Maintenance counseling: 1. Anticipatory guidance: Patient counseled regarding regular dental exams - dentures, eye exams - yearly, wearing seatbelts.  2. Risk factor reduction:  Advised patient of need for regular exercise and diet rich and fruits and vegetables to reduce risk of heart attack and stroke. Exercise- back to gym 3 days a week. [facility] survivors combo of types. Diet-very reasonable.  Wt Readings from Last 3 Encounters:  09/23/16 125 lb 9.6 oz (57 kg)  08/11/16 124 lb 6.4 oz (56.4 kg)  06/30/16 127 lb 1.6 oz (57.7 kg)   3. Immunizations/screenings/ancillary studies Immunization History  Administered Date(s) Administered  . Influenza Split 04/12/2012  . Influenza,inj,Quad PF,36+ Mos 07/14/2015, 06/30/2016  . Influenza-Unspecified 08/15/2014  . Td 11/12/2009  . Zoster 12/04/2012   Health Maintenance Due  Topic Date Due  . Hepatitis C Screening - declines Jul 19, 1951   4. Cervical cancer screening- removed cervix, uterus, fallopian tubes, ovaries. No longer needs screening 5. Breast cancer screening-  breast exam with gynecology and mammogram 06/01/16 6. Colon cancer screening - Dr. Henrene Pastor with planned repeat this year. Last 2009.  7. Skin cancer screening- Dr. Allyson Sabal yearly  Status of chronic or acute concerns   Endometrial cancer, FIGO stage IIIC [facility]) Per  patient : "Robotic, 3 chemotherapy sessions, 25 radiation sessions, 3 internal radiation sessions, 3 chemo to finish. " she is doing phenomenally well. Mild neuropathy numbness/tingling in feet getting slowly better- thought to be due to chemotherapy   Hyperlipidemia off atorvastatin during treatment- update labs and consider restart  Insomnia Sleeping 3-4 hours a night at worst. Takes [facility] sleep aid after 3-4 nights. Did do temazepam 15mg  and ativan 0.5mg  during cancer treatment- may trial those at next step. But will start with trazodone. She can let me know if wants to trial other- I am willing. She only wants to use every 3-4 nights  1 year CPE- aware of HPC transition  Orders Placed This Encounter  Procedures  . CBC with Differential/Platelet    Standing Status:   Future    Standing Expiration Date:   09/23/2017  . Comprehensive metabolic panel    [place]    Standing Status:  Future    Standing Expiration Date:   09/23/2017  . Lipid panel    Standing Status:   Future    Standing Expiration Date:   09/23/2017  . TSH    Standing Status:   Future    Standing Expiration Date:   09/23/2017    Meds ordered this encounter  Medications  . traZODone (DESYREL) 50 MG tablet    Sig: Take 0.5-1 tablets (25-50 mg total) by mouth at bedtime as needed for sleep.    Dispense:  30 tablet    Refill:  3    Return precautions advised.   [name], MD

## 2016-09-27 ENCOUNTER — Other Ambulatory Visit (INDEPENDENT_AMBULATORY_CARE_PROVIDER_SITE_OTHER): Payer: BLUE CROSS/BLUE SHIELD

## 2016-09-27 DIAGNOSIS — R319 Hematuria, unspecified: Secondary | ICD-10-CM

## 2016-09-27 DIAGNOSIS — Z0000 Encounter for general adult medical examination without abnormal findings: Secondary | ICD-10-CM | POA: Diagnosis not present

## 2016-09-27 LAB — POC URINALSYSI DIPSTICK (AUTOMATED)
BILIRUBIN UA: NEGATIVE
Glucose, UA: NEGATIVE
KETONES UA: NEGATIVE
Leukocytes, UA: NEGATIVE
NITRITE UA: NEGATIVE
PH UA: 8
PROTEIN UA: NEGATIVE
Spec Grav, UA: 1.015
Urobilinogen, UA: 0.2

## 2016-09-27 LAB — CBC WITH DIFFERENTIAL/PLATELET
BASOS ABS: 0 10*3/uL (ref 0.0–0.1)
Basophils Relative: 0.4 % (ref 0.0–3.0)
EOS ABS: 0.1 10*3/uL (ref 0.0–0.7)
EOS PCT: 1 % (ref 0.0–5.0)
HCT: 37.6 % (ref 36.0–46.0)
HEMOGLOBIN: 12.5 g/dL (ref 12.0–15.0)
LYMPHS ABS: 1.4 10*3/uL (ref 0.7–4.0)
Lymphocytes Relative: 12.2 % (ref 12.0–46.0)
MCHC: 33.1 g/dL (ref 30.0–36.0)
MCV: 105 fl — ABNORMAL HIGH (ref 78.0–100.0)
MONO ABS: 0.7 10*3/uL (ref 0.1–1.0)
Monocytes Relative: 6 % (ref 3.0–12.0)
NEUTROS PCT: 80.4 % — AB (ref 43.0–77.0)
Neutro Abs: 9.1 10*3/uL — ABNORMAL HIGH (ref 1.4–7.7)
Platelets: 321 10*3/uL (ref 150.0–400.0)
RBC: 3.58 Mil/uL — AB (ref 3.87–5.11)
RDW: 13.7 % (ref 11.5–15.5)
WBC: 11.3 10*3/uL — AB (ref 4.0–10.5)

## 2016-09-27 LAB — URINALYSIS, MICROSCOPIC ONLY

## 2016-09-27 LAB — COMPREHENSIVE METABOLIC PANEL
ALBUMIN: 4 g/dL (ref 3.5–5.2)
ALK PHOS: 69 U/L (ref 39–117)
ALT: 9 U/L (ref 0–35)
AST: 14 U/L (ref 0–37)
BUN: 10 mg/dL (ref 6–23)
CO2: 28 mEq/L (ref 19–32)
CREATININE: 0.89 mg/dL (ref 0.40–1.20)
Calcium: 9.6 mg/dL (ref 8.4–10.5)
Chloride: 105 mEq/L (ref 96–112)
GFR: 67.72 mL/min (ref >60.00)
GLUCOSE: 82 mg/dL (ref 70–99)
POTASSIUM: 4.1 meq/L (ref 3.5–5.1)
SODIUM: 140 meq/L (ref 135–145)
TOTAL PROTEIN: 6.2 g/dL (ref 6.0–8.3)
Total Bilirubin: 0.5 mg/dL (ref 0.2–1.2)

## 2016-09-27 LAB — LIPID PANEL
CHOLESTEROL: 244 mg/dL — AB (ref 0–200)
HDL: 56.2 mg/dL (ref >39.00)
LDL Cholesterol: 168 mg/dL — ABNORMAL HIGH (ref 0–99)
NONHDL: 188
Total CHOL/HDL Ratio: 4
Triglycerides: 98 mg/dL (ref 0.0–149.0)
VLDL: 19.6 mg/dL (ref 0.0–40.0)

## 2016-09-27 LAB — TSH: TSH: 0.65 u[IU]/mL (ref 0.35–4.50)

## 2016-09-28 ENCOUNTER — Encounter: Payer: Self-pay | Admitting: Internal Medicine

## 2016-11-14 ENCOUNTER — Ambulatory Visit (AMBULATORY_SURGERY_CENTER): Payer: Self-pay | Admitting: *Deleted

## 2016-11-14 ENCOUNTER — Encounter: Payer: Self-pay | Admitting: Internal Medicine

## 2016-11-14 VITALS — Ht 63.0 in | Wt 124.0 lb

## 2016-11-14 DIAGNOSIS — Z86010 Personal history of colonic polyps: Secondary | ICD-10-CM

## 2016-11-14 MED ORDER — NA SULFATE-K SULFATE-MG SULF 17.5-3.13-1.6 GM/177ML PO SOLN: 1.0000 | Freq: Once | ORAL | 0 refills | Status: AC

## 2016-11-14 NOTE — Progress Notes (Signed)
No egg or soy allergy known to patient   issues with past sedation with any surgeries  or procedures with fent/ versed had projectile vomiting post procedure , no intubation problems  No diet pills per patient No home 02 use per patient  No blood thinners per patient  Pt denies issues with constipation - with chemo and radiation had severe constipation and alternated with diarrhea- she has now soft normal regular stools - has stool softeners on hand if needed  No A fib or A flutter  emmi video to e mail

## 2016-11-20 NOTE — Progress Notes (Signed)
Follow-up Note: Gyn-Onc   Jacqueline Hammond 66 y.o. female  Chief Complaint  Patient presents with  . endometrial cancer    s/p hysterectomy    Assessment :Stage IIIC1 (with ITC's) serous high grade endometrial adenocarcioma. Cervical stromal involvement. s/p adjuvant therapy (chemotherapy and radiation) with no evidence of recurrence.  Plan   She has no significant persistent toxicities to therapy. Post treatment imaging unremarkable.  Recommend seeing her every 3 months for physical exam, pelvic exam and symptom assessment. I discussed symptoms concerning for maligancy and notified her that she should inform us of these should they develop.  Dr Jacqueline Hammond will see her in July, 2018 and I will see her in October, 2018.  HPI: 66 year old white married female gravida 2 seen in consultation request of Dr. Abbie Hammond regarding management of a newly diagnosed endometrial carcinoma. The patient initially had some spotting and a Pap smear revealing atypical glandular cells. She underwent sonohysterogram and endometrial biopsy on 10/19/2015 revealing an endometrial adenocarcinoma with serous features (high-grade) the patient is currently not having any bleeding and has no pelvic symptoms. The uterus measured 7.4 x 3.5 x 3.0 cm with an endometrial stripe of 6.8 mm.  She has no significant past gynecologic history although she had a tubal ligation through a Pfannenstiel incision. Subsequently she underwent diagnostic laparoscopy and lysis of adhesions for pelvic pain which is now resolved. She claims her Pap smears the past and been normal. She has no other significant medical history.  On 10/29/15 she underwent a robotic hysterectomy, BSO and sentinel lymph node biopsy of pelvic and PA nodes at [facility] with Dr Jacqueline Hammond. Surgery was uncomplicated. Postoperative pathology revealed a 81mm tumor of high grade serous adenocarcinoma involving the cervical stroma with no LVSI. There was outer half myometrial  infiltration (12 of 8mm). The left and right pelvic SLN's were positive for ITC's on IHC.  Interval Hx:   She commenced "sandwich" adjuvant therapy with 6 cycles (total) of chemotherapy with carboplatin and paclitaxel completed on 04/21/16, and pelvic external beam radiation (with brachytherapy) completed on 02-23-16.  She did very well during treatment.   Persistent toxicities include mild neuropathy and diarrhea (though this is a chronic problem).  Review of Systems:10 point review of systems is negative except as noted in interval history.   Vitals: Blood pressure 111/81, pulse 82, temperature 98.7 F (37.1 C), resp. rate 18, weight 122 lb 1.6 oz (55.4 kg).  Physical Exam: General : The patient is a healthy woman in no acute distress.  HEENT: normocephalic, extraoccular movements normal; neck is supple without thyromegally  Lynphnodes: Supraclavicular and inguinal nodes not enlarged  Abdomen: Soft, non-tender, no ascites, no organomegally, no masses, no hernias , well-healed laparoscopic incisions. Pelvic:  EGBUS: Normal female  Vagina: vaginal cuff in tact, free of cellulitis or blood. Healing well.  Urethra and Bladder: Normal, non-tender  Cervix: surgically absent Uterus: surgically absent Bi-manual examination: Non-tender; no masses Rectal: deferred Lower extremities: No edema or varicosities. Normal range of motion      Allergies  Allergen Reactions  . Codeine Sulfate     REACTION: nausea, pass out  . Neomycin-Bacitracin Zn-Polymyx     REACTION: redness    Past Medical History:  Diagnosis Date  . Adenocarcinoma of endometrium ([place]) 09/2015  . Allergy    mild seasonal  . Atypical glandular cells of undetermined significance (AGUS) on cervical Pap smear 09/2015  . GERD (gastroesophageal reflux disease)   . Heart murmur    as teenager but  not mentioned since  . History of chemotherapy    completed nov 2017  . History of radiation therapy 01/19/16-03/24/16   pelvis  45 Gy in 25 fx, vaginal cuff 18 Gy in 3 fx completed nov 2017  . Hyperlipidemia   . Osteopenia 09/2015   T score -1.4 FRAX 8.5%/0.8%    Past Surgical History:  Procedure Laterality Date  . COLONOSCOPY    . IR GENERIC HISTORICAL  03/02/2016   IR US GUIDE VASC ACCESS RIGHT 03/02/2016 Jacqueline Cleveland, MD [facility]  . IR GENERIC HISTORICAL  03/02/2016   IR FLUORO GUIDE CV LINE RIGHT 03/02/2016 Jacqueline Cleveland, MD [facility]  . LAPAROSCOPY    . POLYPECTOMY    . ROBOTIC ASSISTED TOTAL HYSTERECTOMY WITH BILATERAL SALPINGO OOPHERECTOMY  10/29/15   robotic hysterectomy, BSO and sentinel lymph node biopsy of pelvic and PA nodes at [facility] with Dr Jacqueline Hammond.   . TUBAL LIGATION      Current Outpatient Prescriptions  Medication Sig Dispense Refill  . atorvastatin (LIPITOR) 20 MG tablet TAKE 1 TABLET BY MOUTH EVERY DAY 30 tablet 5  . b complex vitamins capsule Take 1 capsule by mouth daily.    . Multiple Vitamin (MULTIVITAMIN) tablet Take 1 tablet by mouth daily.    . traZODone (DESYREL) 50 MG tablet Take 0.5-1 tablets (25-50 mg total) by mouth at bedtime as needed for sleep. 30 tablet 3   No current facility-administered medications for this visit.     Social History   Social History  . Marital status: Married    Spouse name: N/A  . Number of children: 2  . Years of education: N/A   Occupational History  . Not on file.   Social History Main Topics  . Smoking status: Current Some Day Smoker    Packs/day: 1.00    Years: 32.00    Types: Cigarettes  . Smokeless tobacco: Never Used     Comment: 3 cigarettes/ day as of 06-30-16  . Alcohol use No  . Drug use: No  . Sexual activity: Yes     Comment: 1st intercourse 66 yo-1 partner   Other Topics Concern  . Not on file   Social History Narrative   Married 1970 (Jacqueline Hammond is a patient here). 2 Hammond. 3 grandchildren. 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 Cancer Mother     larynx  . Heart failure Father   . Heart attack Father 43    smoker, war vet  . Melanoma Father   . Heart attack Brother 38  . Colon polyps Brother   . Colon cancer Paternal Grandmother 84  . Colon polyps Sister   . Esophageal cancer Neg Hx   . Rectal cancer Neg Hx   . Stomach cancer Neg Hx      Donaciano Eva, MD 11/21/2016, 3:10 PM

## 2016-11-21 ENCOUNTER — Encounter: Payer: Self-pay | Admitting: Gynecologic Oncology

## 2016-11-21 ENCOUNTER — Ambulatory Visit: Payer: BLUE CROSS/BLUE SHIELD | Attending: Gynecologic Oncology | Admitting: Gynecologic Oncology

## 2016-11-21 VITALS — BP 111/81 | HR 82 | Temp 98.7°F | Resp 18 | Wt 122.1 lb

## 2016-11-21 DIAGNOSIS — C541 Malignant neoplasm of endometrium: Secondary | ICD-10-CM

## 2016-11-21 DIAGNOSIS — E785 Hyperlipidemia, unspecified: Secondary | ICD-10-CM | POA: Diagnosis not present

## 2016-11-21 DIAGNOSIS — F17210 Nicotine dependence, cigarettes, uncomplicated: Secondary | ICD-10-CM | POA: Diagnosis not present

## 2016-11-21 DIAGNOSIS — Z8249 Family history of ischemic heart disease and other diseases of the circulatory system: Secondary | ICD-10-CM | POA: Insufficient documentation

## 2016-11-21 DIAGNOSIS — Z08 Encounter for follow-up examination after completed treatment for malignant neoplasm: Secondary | ICD-10-CM | POA: Insufficient documentation

## 2016-11-21 DIAGNOSIS — Z923 Personal history of irradiation: Secondary | ICD-10-CM | POA: Insufficient documentation

## 2016-11-21 DIAGNOSIS — Z90710 Acquired absence of both cervix and uterus: Secondary | ICD-10-CM | POA: Diagnosis not present

## 2016-11-21 DIAGNOSIS — K219 Gastro-esophageal reflux disease without esophagitis: Secondary | ICD-10-CM | POA: Diagnosis not present

## 2016-11-21 DIAGNOSIS — Z79899 Other long term (current) drug therapy: Secondary | ICD-10-CM | POA: Diagnosis not present

## 2016-11-21 DIAGNOSIS — Z800 Family history of malignant neoplasm of digestive organs: Secondary | ICD-10-CM | POA: Diagnosis not present

## 2016-11-21 DIAGNOSIS — Z90722 Acquired absence of ovaries, bilateral: Secondary | ICD-10-CM | POA: Diagnosis not present

## 2016-11-21 DIAGNOSIS — M8580 Other specified disorders of bone density and structure, unspecified site: Secondary | ICD-10-CM | POA: Diagnosis not present

## 2016-11-21 DIAGNOSIS — Z8542 Personal history of malignant neoplasm of other parts of uterus: Secondary | ICD-10-CM | POA: Insufficient documentation

## 2016-11-21 DIAGNOSIS — Z9221 Personal history of antineoplastic chemotherapy: Secondary | ICD-10-CM | POA: Insufficient documentation

## 2016-11-21 DIAGNOSIS — Z808 Family history of malignant neoplasm of other organs or systems: Secondary | ICD-10-CM | POA: Insufficient documentation

## 2016-11-21 DIAGNOSIS — Z885 Allergy status to narcotic agent status: Secondary | ICD-10-CM | POA: Insufficient documentation

## 2016-11-21 NOTE — Patient Instructions (Addendum)
You can return to see Dr Sondra Come in July, 2018 and Dr Denman George in October, 2018  Please notify Dr Denman George if you see vaginal or urinary bleeding, or any other concerning symptoms.

## 2016-11-28 ENCOUNTER — Encounter: Payer: BLUE CROSS/BLUE SHIELD | Admitting: Internal Medicine

## 2017-02-06 ENCOUNTER — Telehealth: Payer: Self-pay

## 2017-02-06 ENCOUNTER — Ambulatory Visit (AMBULATORY_SURGERY_CENTER): Payer: Self-pay

## 2017-02-06 VITALS — Ht 63.0 in | Wt 124.0 lb

## 2017-02-06 DIAGNOSIS — Z86010 Personal history of colonic polyps: Secondary | ICD-10-CM

## 2017-02-06 MED ORDER — NA SULFATE-K SULFATE-MG SULF 17.5-3.13-1.6 GM/177ML PO SOLN: 1.0000 | Freq: Once | ORAL | 0 refills | Status: AC

## 2017-02-06 NOTE — Telephone Encounter (Signed)
Chart reviewed. Okay to proceed. Please mention pediatric colonoscope in the appointment notes. Thanks

## 2017-02-06 NOTE — Progress Notes (Signed)
Denies allergies to eggs or soy products. Denies complication of anesthesia or sedation. Denies use of weight loss medication. Denies use of O2.   Emmi instructions declined.  

## 2017-02-06 NOTE — Telephone Encounter (Signed)
Dr. Henrene Pastor,  [place] Penner came in for [facility] today. She has a colonoscopy scheduled for 02/20/17 @ 8:30 Am. Patients last dose of Chemo therapy was in October 2017. Patient states that she is doing well and would like to proceed with colonoscopy. Is it ok for [facility] if it has not been 12 months since her last dose of chemo? Please advise. Thanks.  Riki Sheer, LPN

## 2017-02-06 NOTE — Telephone Encounter (Signed)
Patient was called and left a message per Dr. Henrene Pastor it is ok to proceed with colonoscopy that is scheduled for 02/20/17. Patients last dose of chemo therapy was Oct. 2017. Dr. Henrene Pastor will use the pediatric scope for procedure.   Riki Sheer, LPN

## 2017-02-09 ENCOUNTER — Ambulatory Visit
Admission: RE | Admit: 2017-02-09 | Discharge: 2017-02-09 | Disposition: A | Discharge status: Home / Self Care | Payer: PPO | Source: Ambulatory Visit | Attending: Radiation Oncology | Admitting: Radiation Oncology

## 2017-02-09 ENCOUNTER — Encounter: Payer: Self-pay | Admitting: Radiation Oncology

## 2017-02-09 VITALS — BP 113/84 | HR 74 | Temp 97.8°F | Ht 63.0 in | Wt 123.0 lb

## 2017-02-09 DIAGNOSIS — Z923 Personal history of irradiation: Secondary | ICD-10-CM | POA: Diagnosis not present

## 2017-02-09 DIAGNOSIS — C7982 Secondary malignant neoplasm of genital organs: Secondary | ICD-10-CM

## 2017-02-09 DIAGNOSIS — Z8542 Personal history of malignant neoplasm of other parts of uterus: Secondary | ICD-10-CM | POA: Diagnosis not present

## 2017-02-09 DIAGNOSIS — Z79899 Other long term (current) drug therapy: Secondary | ICD-10-CM | POA: Diagnosis not present

## 2017-02-09 DIAGNOSIS — C775 Secondary and unspecified malignant neoplasm of intrapelvic lymph nodes: Secondary | ICD-10-CM

## 2017-02-09 DIAGNOSIS — Z8579 Personal history of other malignant neoplasms of lymphoid, hematopoietic and related tissues: Secondary | ICD-10-CM | POA: Diagnosis not present

## 2017-02-09 DIAGNOSIS — C541 Malignant neoplasm of endometrium: Secondary | ICD-10-CM

## 2017-02-09 DIAGNOSIS — Z885 Allergy status to narcotic agent status: Secondary | ICD-10-CM | POA: Insufficient documentation

## 2017-02-09 DIAGNOSIS — Z08 Encounter for follow-up examination after completed treatment for malignant neoplasm: Secondary | ICD-10-CM | POA: Insufficient documentation

## 2017-02-09 NOTE — Progress Notes (Signed)
  Radiation Oncology         [phone] ________________________________  Name: Jacqueline Hammond MRN: [id number]  Date: 02/09/2017  DOB: [birth year]  Follow-Up Visit Note  CC: Marin Olp, MD  Dorothyann Gibbs, NP    ICD-10-CM   1. Secondary malignant neoplasm of cervix ([place]) C79.82   2. Endometrial cancer, FIGO stage IIIC ([facility]) C54.1   3. Secondary malignant neoplasm of iliac lymph nodes ([facility]) C77.5     Diagnosis:   Stage IIIC1 high grade serous carcinoma of endometrium  Interval Since Last Radiation:  11 months  1) 01/19/16-02/23/16: Pelvis/ 45 Gy in 25 fx 2) 03/08/16, 03/17/16, 03/24/16: Vaginal cuff brachytherapy/ 18 Gy in 3 fx  Narrative:  The patient returns today for routine follow-up.  She denies having pain and urinary issues.  She reports having diarrhea occasionally.  She will have a colonoscopy on 7/23 with Dr. Henrene Pastor.  She denies having any vaginal bleeding/discharge or fatigue.  She is not using a vaginal dilator. She denies nausea and blood in her stool.                       ALLERGIES:  is allergic to codeine sulfate and neomycin-bacitracin zn-polymyx.  Meds: Current Outpatient Prescriptions  Medication Sig Dispense Refill  . atorvastatin (LIPITOR) 20 MG tablet TAKE 1 TABLET BY MOUTH EVERY DAY 30 tablet 5  . b complex vitamins capsule Take 1 capsule by mouth daily.    . Multiple Vitamin (MULTIVITAMIN) tablet Take 1 tablet by mouth daily.    . traZODone (DESYREL) 50 MG tablet Take 0.5-1 tablets (25-50 mg total) by mouth at bedtime as needed for sleep. 30 tablet 3   No current facility-administered medications for this encounter.     Physical Findings: The patient is in no acute distress. Patient is alert and oriented.  height is 5\' 3"  (1.6 m) and weight is 123 lb (55.8 kg). Her oral temperature is 97.8 F (36.6 C). Her blood pressure is 113/84 and her pulse is 74. Her oxygen saturation is 100%. .  Lungs are clear to auscultation bilaterally. Heart has  regular rate and rhythm. No palpable cervical, supraclavicular, or axillary adenopathy. Abdomen soft, non-tender, normal bowel sounds. On pelvic examination the external genitalia were unremarkable. A speculum exam was performed. There are no mucosal lesions noted in the vaginal vault. On bimanual and rectovaginal examination there were no pelvic masses appreciated.  Lab Findings: Lab Results  Component Value Date   WBC 11.3 (H) 09/27/2016   HGB 12.5 09/27/2016   HCT 37.6 09/27/2016   MCV 105.0 (H) 09/27/2016   PLT 321.0 09/27/2016    Radiographic Findings: No results found.  Impression:  No evidence of recurrence on clinical exam.  Plan:  Routine follow up in 6 months. Pt will see Dr. Denman George in October.  -----------------------------------  Blair Promise, PhD, MD  This document serves as a record of services personally performed by Gery Pray, MD. It was created on his behalf by Linward Natal, a trained medical scribe. The creation of this record is based on the scribe's personal observations and the provider's statements to them. This document has been checked and approved by the attending provider.

## 2017-02-09 NOTE — Progress Notes (Signed)
Jacqueline Hammond is here for follow up.  She denies having pain and urinary issues.  She reports having diarrhea occasionally.  She will have a colonoscopy on 7/23 with Dr. Henrene Pastor.  She denies having any vaginal bleeding/discharge or fatigue.  She is not using a vaginal dilator.  BP 113/84 (BP Location: Right Arm, Patient Position: Sitting)   Pulse 74   Temp 97.8 F (36.6 C) (Oral)   Ht 5\' 3"  (1.6 m)   Wt 123 lb (55.8 kg)   SpO2 100%   BMI 21.79 kg/m    Wt Readings from Last 3 Encounters:  02/09/17 123 lb (55.8 kg)  02/06/17 124 lb (56.2 kg)  11/21/16 122 lb 1.6 oz (55.4 kg)

## 2017-02-20 ENCOUNTER — Encounter: Payer: Self-pay | Admitting: Internal Medicine

## 2017-02-20 ENCOUNTER — Ambulatory Visit (AMBULATORY_SURGERY_CENTER): Payer: PPO | Admitting: Internal Medicine

## 2017-02-20 VITALS — BP 108/58 | HR 66 | Temp 98.9°F | Resp 15 | Ht 63.0 in | Wt 124.0 lb

## 2017-02-20 DIAGNOSIS — D122 Benign neoplasm of ascending colon: Secondary | ICD-10-CM

## 2017-02-20 DIAGNOSIS — Z86010 Personal history of colonic polyps: Secondary | ICD-10-CM

## 2017-02-20 DIAGNOSIS — F329 Major depressive disorder, single episode, unspecified: Secondary | ICD-10-CM | POA: Diagnosis not present

## 2017-02-20 MED ORDER — SODIUM CHLORIDE 0.9 % IV SOLN: 500.0000 mL | INTRAVENOUS | Status: DC

## 2017-02-20 NOTE — Progress Notes (Signed)
Pt's states no medical or surgical changes since previsit or office visit. 

## 2017-02-20 NOTE — Patient Instructions (Signed)
YOU HAD AN ENDOSCOPIC PROCEDURE TODAY AT THE [facility]:   Refer to the procedure report that was given to you for any specific questions about what was found during the examination.  If the procedure report does not answer your questions, please call your gastroenterologist to clarify.  If you requested that your care partner not be given the details of your procedure findings, then the procedure report has been included in a sealed envelope for you to review at your convenience later.  YOU SHOULD EXPECT: Some feelings of bloating in the abdomen. Passage of more gas than usual.  Walking can help get rid of the air that was put into your GI tract during the procedure and reduce the bloating. If you had a lower endoscopy (such as a colonoscopy or flexible sigmoidoscopy) you may notice spotting of blood in your stool or on the toilet paper. If you underwent a bowel prep for your procedure, you may not have a normal bowel movement for a few days.  Please Note:  You might notice some irritation and congestion in your nose or some drainage.  This is from the oxygen used during your procedure.  There is no need for concern and it should clear up in a day or so.  SYMPTOMS TO REPORT IMMEDIATELY:   Following lower endoscopy (colonoscopy or flexible sigmoidoscopy):  Excessive amounts of blood in the stool  Significant tenderness or worsening of abdominal pains  Swelling of the abdomen that is new, acute  Fever of 100F or higher   For urgent or emergent issues, a gastroenterologist can be reached at any hour by calling [phone].   DIET:  We do recommend a small meal at first, but then you may proceed to your regular diet.  Drink plenty of fluids but you should avoid alcoholic beverages for 24 hours.  ACTIVITY:  You should plan to take it easy for the rest of today and you should NOT DRIVE or use heavy machinery until tomorrow (because of the sedation medicines used during the test).     FOLLOW UP: Our staff will call the number listed on your records the next business day following your procedure to check on you and address any questions or concerns that you may have regarding the information given to you following your procedure. If we do not reach you, we will leave a message.  However, if you are feeling well and you are not experiencing any problems, there is no need to return our call.  We will assume that you have returned to your regular daily activities without incident.  If any biopsies were taken you will be contacted by phone or by letter within the next 1-3 weeks.  Please call us at [phone] if you have not heard about the biopsies in 3 weeks.    SIGNATURES/CONFIDENTIALITY: You and/or your care partner have signed paperwork which will be entered into your electronic medical record.  These signatures attest to the fact that that the information above on your After Visit Summary has been reviewed and is understood.  Full responsibility of the confidentiality of this discharge information lies with you and/or your care-partner.   Handout was given to your care partner on polyps. You may resume your current medications today. Await biopsy results. Please call if any questions or concerns.   

## 2017-02-20 NOTE — Progress Notes (Signed)
No problems noted in the recovery room. maw 

## 2017-02-20 NOTE — Progress Notes (Signed)
Called to room to assist during endoscopic procedure.  Patient ID and intended procedure confirmed with present staff. Received instructions for my participation in the procedure from the performing physician.  

## 2017-02-20 NOTE — Progress Notes (Signed)
Report to PACU, RN, vss, BBS= Clear.  

## 2017-02-20 NOTE — Op Note (Signed)
[place] Patient Name: Jacqueline Hammond Procedure Date: 02/20/2017 8:39 AM MRN: [id number] Endoscopist: Docia Chuck. Henrene Pastor , MD Age: 66 Referring MD:  Date of Birth: 1951/04/23 Gender: Female Account #: [id number] Procedure:                Colonoscopy, with snare polypectomy X2 Indications:              High risk colon cancer surveillance: Personal                            history of non-advanced adenoma. Prior exam 2009                            with small adenomas and HP. History of endometrial                            cancer treated fall 2017 Medicines:                Monitored Anesthesia Care Procedure:                Pre-Anesthesia Assessment:                           - Prior to the procedure, a History and Physical                            was performed, and patient medications and                            allergies were reviewed. The patient's tolerance of                            previous anesthesia was also reviewed. The risks                            and benefits of the procedure and the sedation                            options and risks were discussed with the patient.                            All questions were answered, and informed consent                            was obtained. Prior Anticoagulants: The patient has                            taken no previous anticoagulant or antiplatelet                            agents. ASA Grade Assessment: III - A patient with                            severe systemic disease. After reviewing the risks  and benefits, the patient was deemed in                            satisfactory condition to undergo the procedure.                           After obtaining informed consent, the colonoscope                            was passed under direct vision. Throughout the                            procedure, the patient's blood pressure, pulse, and                            oxygen  saturations were monitored continuously. The                            Colonoscope was introduced through the anus and                            advanced to the the cecum, identified by                            appendiceal orifice and ileocecal valve. The                            ileocecal valve, appendiceal orifice, and rectum                            were photographed. The quality of the bowel                            preparation was excellent. The colonoscopy was                            performed without difficulty. The patient tolerated                            the procedure well. The bowel preparation used was                            SUPREP. Scope In: 8:44:09 AM Scope Out: 8:58:50 AM Scope Withdrawal Time: 0 hours 11 minutes 51 seconds  Total Procedure Duration: 0 hours 14 minutes 41 seconds  Findings:                 Two polyps were found in the ascending colon. The                            polyps were 2 to 4 mm in size. These polyps were                            removed with a cold snare. Resection and retrieval  were complete.                           The exam was otherwise without abnormality on                            direct and retroflexion views. Complications:            No immediate complications. Estimated blood loss:                            None. Estimated Blood Loss:     Estimated blood loss: none. Impression:               - Two 2 to 4 mm polyps in the ascending colon,                            removed with a cold snare. Resected and retrieved.                           - The examination was otherwise normal on direct                            and retroflexion views. Recommendation:           - Repeat colonoscopy in 5 years for                            surveillance(PEDIATRIC SCOPE).                           - Patient has a contact number available for                            emergencies. The signs and  symptoms of potential                            delayed complications were discussed with the                            patient. Return to normal activities tomorrow.                            Written discharge instructions were provided to the                            patient.                           - Resume previous diet.                           - Continue present medications.                           - Await pathology results. Docia Chuck. Henrene Pastor, MD 02/20/2017 9:02:59 AM This report has been signed electronically.

## 2017-02-21 ENCOUNTER — Telehealth: Payer: Self-pay | Admitting: *Deleted

## 2017-02-21 NOTE — Telephone Encounter (Signed)
Left message on f/u call 

## 2017-02-23 ENCOUNTER — Encounter: Payer: Self-pay | Admitting: Internal Medicine

## 2017-04-24 ENCOUNTER — Ambulatory Visit: Payer: BLUE CROSS/BLUE SHIELD | Admitting: Gynecologic Oncology

## 2017-05-15 ENCOUNTER — Ambulatory Visit: Payer: PPO | Attending: Gynecologic Oncology | Admitting: Gynecologic Oncology

## 2017-05-15 ENCOUNTER — Encounter: Payer: Self-pay | Admitting: Gynecologic Oncology

## 2017-05-15 VITALS — BP 125/76 | HR 82 | Temp 98.4°F | Resp 20 | Ht 63.0 in | Wt 127.2 lb

## 2017-05-15 DIAGNOSIS — Z9221 Personal history of antineoplastic chemotherapy: Secondary | ICD-10-CM | POA: Insufficient documentation

## 2017-05-15 DIAGNOSIS — F17210 Nicotine dependence, cigarettes, uncomplicated: Secondary | ICD-10-CM | POA: Insufficient documentation

## 2017-05-15 DIAGNOSIS — C541 Malignant neoplasm of endometrium: Secondary | ICD-10-CM

## 2017-05-15 DIAGNOSIS — M8580 Other specified disorders of bone density and structure, unspecified site: Secondary | ICD-10-CM | POA: Insufficient documentation

## 2017-05-15 DIAGNOSIS — E785 Hyperlipidemia, unspecified: Secondary | ICD-10-CM | POA: Insufficient documentation

## 2017-05-15 DIAGNOSIS — Z923 Personal history of irradiation: Secondary | ICD-10-CM | POA: Diagnosis not present

## 2017-05-15 DIAGNOSIS — Z08 Encounter for follow-up examination after completed treatment for malignant neoplasm: Secondary | ICD-10-CM | POA: Insufficient documentation

## 2017-05-15 DIAGNOSIS — Z8542 Personal history of malignant neoplasm of other parts of uterus: Secondary | ICD-10-CM

## 2017-05-15 DIAGNOSIS — K219 Gastro-esophageal reflux disease without esophagitis: Secondary | ICD-10-CM | POA: Diagnosis not present

## 2017-05-15 NOTE — Patient Instructions (Signed)
Plan on following up with Dr. Sondra Come in Jan 2019 and Dr. Denman George in April.  Please call our office after you see Dr. Sondra Come to schedule your appointment with Dr. Denman George.  Please call for any needs, concerns, or new symptoms.

## 2017-05-15 NOTE — Progress Notes (Signed)
Follow-up Note: Gyn-Onc   Jacqueline Hammond 66 y.o. female  Chief Complaint  Patient presents with  . 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 Cervical stromal involvement. s/p adjuvant therapy (chemotherapy and radiation) completed in September, 2017 with no evidence of recurrence.  Plan   She has no significant persistent toxicities to therapy. Post treatment imaging unremarkable.  Recommend seeing her every 3 months for physical exam, pelvic exam and symptom assessment. I discussed symptoms concerning for maligancy and notified her that she should inform us of these should they develop.  Dr Jacqueline Hammond will see her in January, 2019 and I will see her in April, 2019.  HPI: 66 year old white married female gravida 2 seen in consultation request of Dr. Abbie Hammond regarding management of a newly diagnosed endometrial carcinoma. The patient initially had some spotting and a Pap smear revealing atypical glandular cells. She underwent sonohysterogram and endometrial biopsy on 10/19/2015 revealing an endometrial adenocarcinoma with serous features (high-grade) the patient is currently not having any bleeding and has no pelvic symptoms. The uterus measured 7.4 x 3.5 x 3.0 cm with an endometrial stripe of 6.8 mm.  She has no significant past gynecologic history although she had a tubal ligation through a Pfannenstiel incision. Subsequently she underwent diagnostic laparoscopy and lysis of adhesions for pelvic pain which is now resolved. She claims her Pap smears the past and been normal. She has no other significant medical history.  On 10/29/15 she underwent a robotic hysterectomy, BSO and sentinel lymph node biopsy of pelvic and PA nodes at [facility] with Dr Jacqueline Hammond. Surgery was uncomplicated. Postoperative pathology revealed a 68mm tumor of high grade serous adenocarcinoma involving the cervical stroma with no LVSI. There was outer half  myometrial infiltration (12 of 18mm). The left and right pelvic SLN's were positive for ITC's on IHC.  Interval Hx:   She commenced "sandwich" adjuvant therapy with 6 cycles (total) of chemotherapy with carboplatin and paclitaxel completed on 04/21/16, and pelvic external beam radiation (with brachytherapy) completed on 02-23-16.  She did very well during treatment.   Persistent toxicities include mild neuropathy and diarrhea (though this is a chronic problem).  Review of Systems:10 point review of systems is negative except as noted in interval history.   Vitals: Blood pressure 125/76, pulse 82, temperature 98.4 F (36.9 C), temperature source Oral, resp. rate 20, height 5\' 3"  (1.6 m), weight 127 lb 3.2 oz (57.7 kg), SpO2 100 %.  Physical Exam: General : The patient is a healthy woman in no acute distress.  HEENT: normocephalic, extraoccular movements normal; neck is supple without thyromegally  Lynphnodes: Supraclavicular and inguinal nodes not enlarged  Abdomen: Soft, non-tender, no ascites, no organomegally, no masses, no hernias , well-healed laparoscopic incisions. Pelvic:  EGBUS: Normal female  Vagina: vaginal cuff in tact, free of cellulitis or blood. Healing well.  Urethra and Bladder: Normal, non-tender  Cervix: surgically absent Uterus: surgically absent Bi-manual examination: Non-tender; no masses Rectal: deferred Lower extremities: No edema or varicosities. Normal range of motion      Allergies  Allergen Reactions  . Codeine Sulfate     REACTION: nausea, pass out  . Neomycin-Bacitracin Zn-Polymyx     REACTION: redness    Past Medical History:  Diagnosis Date  . Adenocarcinoma of endometrium ([place]) 09/2015  . Allergy    mild seasonal  . Atypical glandular cells of undetermined significance (AGUS) on cervical Pap smear 09/2015  . GERD (gastroesophageal reflux disease)   .  Heart murmur    as teenager but not mentioned since  . History of chemotherapy     completed nov 2017  . History of radiation therapy 01/19/16-03/24/16   pelvis 45 Gy in 25 fx, vaginal cuff 18 Gy in 3 fx completed nov 2017  . Hyperlipidemia   . Osteopenia 09/2015   T score -1.4 FRAX 8.5%/0.8%    Past Surgical History:  Procedure Laterality Date  . COLONOSCOPY    . IR GENERIC HISTORICAL  03/02/2016   IR US GUIDE VASC ACCESS RIGHT 03/02/2016 Jacqueline Cleveland, MD [facility]INTER[facility]  . IR GENERIC HISTORICAL  03/02/2016   IR FLUORO GUIDE CV LINE RIGHT 03/02/2016 Jacqueline Cleveland, MD [facility]  . LAPAROSCOPY    . POLYPECTOMY    . ROBOTIC ASSISTED TOTAL HYSTERECTOMY WITH BILATERAL SALPINGO OOPHERECTOMY  10/29/15   robotic hysterectomy, BSO and sentinel lymph node biopsy of pelvic and PA nodes at [facility] with Dr Jacqueline Hammond.   . TUBAL LIGATION      Current Outpatient Prescriptions  Medication Sig Dispense Refill  . atorvastatin (LIPITOR) 20 MG tablet TAKE 1 TABLET BY MOUTH EVERY DAY 30 tablet 5  . b complex vitamins capsule Take 1 capsule by mouth daily.    . Multiple Vitamin (MULTIVITAMIN) tablet Take 1 tablet by mouth daily.    . traZODone (DESYREL) 50 MG tablet Take 0.5-1 tablets (25-50 mg total) by mouth at bedtime as needed for sleep. 30 tablet 3   Current Facility-Administered Medications  Medication Dose Route Frequency Provider Last Rate Last Dose  . 0.9 %  sodium chloride infusion  500 mL Intravenous Continuous Jacqueline Shipper, MD        Social History   Social History  . Marital status: Married    Spouse name: N/A  . Number of children: 2  . Years of education: N/A   Occupational History  . Not on file.   Social History Main Topics  . Smoking status: Current Some Day Smoker    Packs/day: 1.00    Years: 32.00    Types: Cigarettes  . Smokeless tobacco: Never Used     Comment: 3 cigarettes/ day as of 06-30-16  . Alcohol use No  . Drug use: No  . Sexual activity: Yes     Comment: 1st intercourse 81 yo-1 partner   Other Topics Concern  . Not on file   Social History  Narrative   Married 1970 (Jacqueline Hammond is a patient here). 2 Hammond. 3 grandchildren. 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 Cancer Mother        larynx  . Heart failure Father   . Heart attack Father 31       smoker, war vet  . Melanoma Father   . Heart attack Brother 58  . Colon polyps Brother   . Colon cancer Paternal Grandmother 51  . Colon polyps Sister   . Esophageal cancer Neg Hx   . Rectal cancer Neg Hx   . Stomach cancer Neg Hx      Donaciano Eva, MD 05/15/2017, 3:22 PM

## 2017-05-22 ENCOUNTER — Ambulatory Visit: Payer: PPO | Admitting: Gynecologic Oncology

## 2017-06-09 IMAGING — CT CT ABD-PELV W/ CM
2 of 5 series · 17 of 46 positions shown, 19 images · IV contrast (iopamidol)
Comparison: 10/28/2015

CLINICAL DATA: Endometrial cancer. Hysterectomy October 2015.
Chemotherapy regimens. Radiation therapy. Restaging.

EXAM:
CT ABDOMEN AND PELVIS WITH CONTRAST
TECHNIQUE: Multidetector CT imaging of the abdomen and pelvis was performed
using the standard protocol following bolus administration of
intravenous contrast.
CONTRAST:  100mL 69HXMC-GFF IOPAMIDOL (69HXMC-GFF) INJECTION 61%

[Series 2: rtn a/p with · axial · 0.74mm/px · z∈[-450,-70]mm · 14 of 88 slices shown, 16 images]
[im 6/88  soft-tissue]
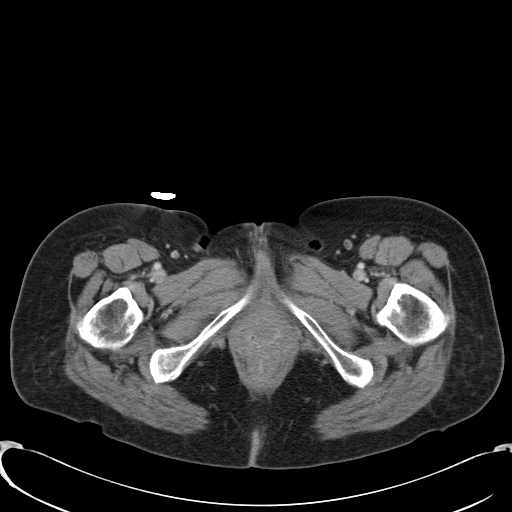
[im 6/88  bone]
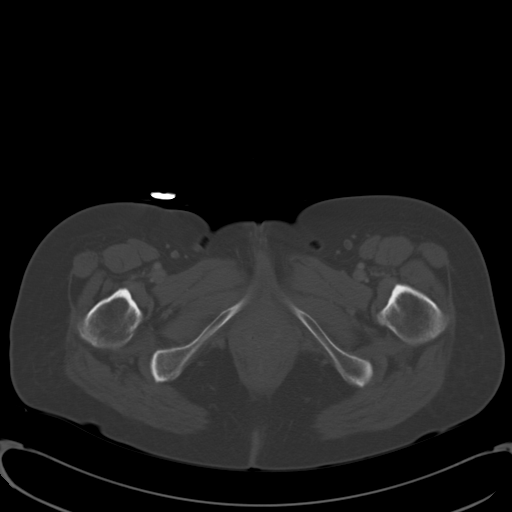
[im 11/88  soft-tissue]
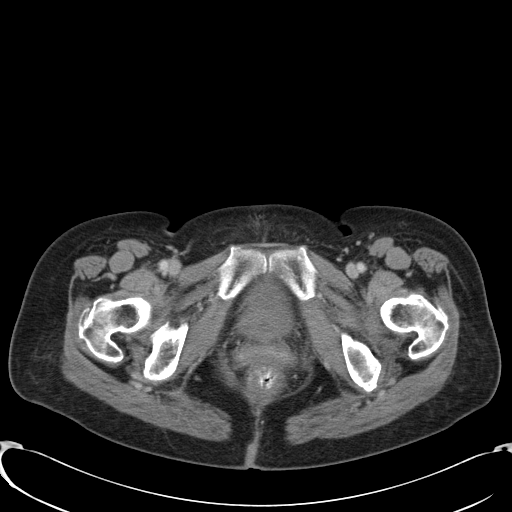
[im 17/88  soft-tissue]
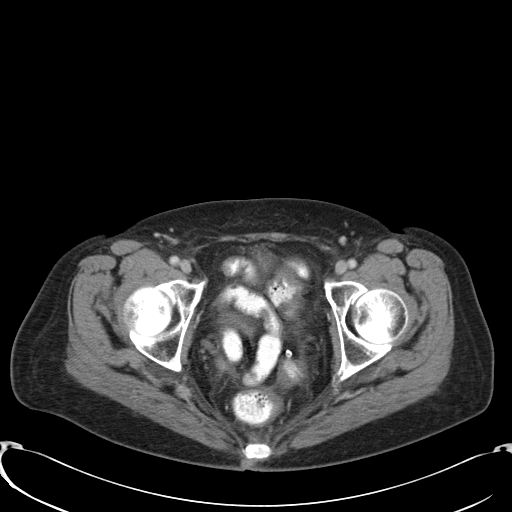
[im 22/88  soft-tissue]
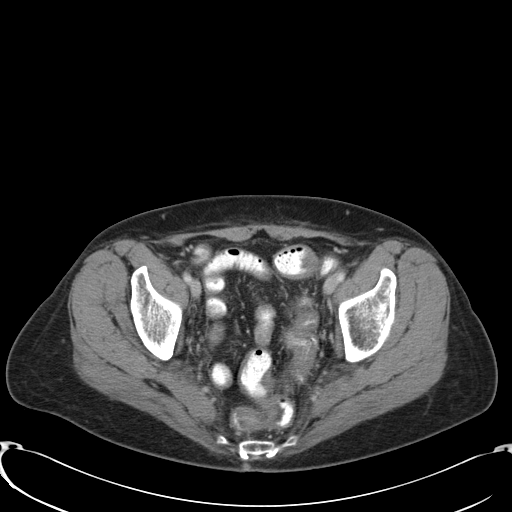
[im 28/88  soft-tissue]
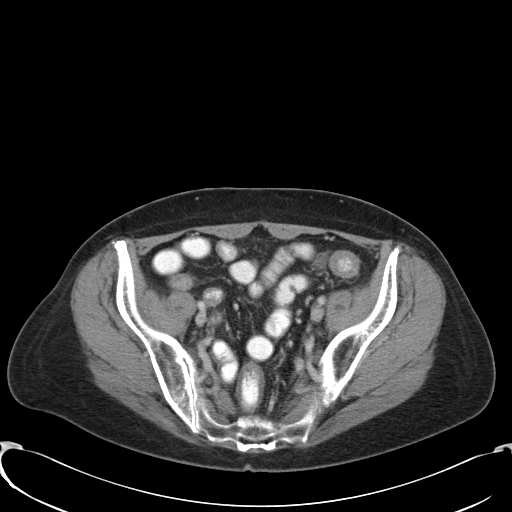
[im 33/88  soft-tissue]
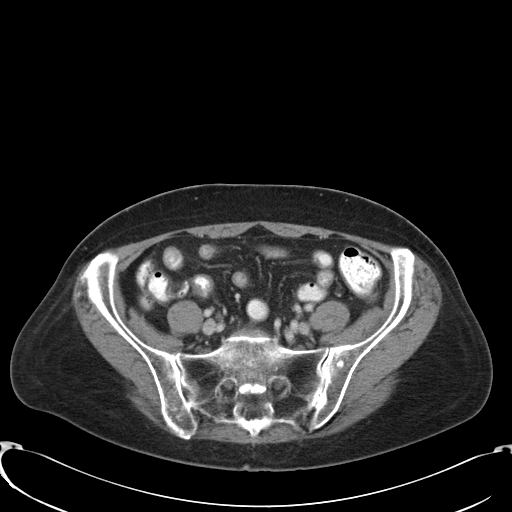
[im 39/88  soft-tissue]
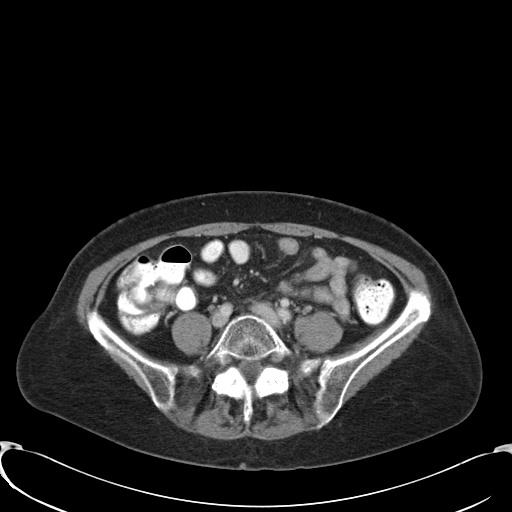
[im 49/88  soft-tissue]
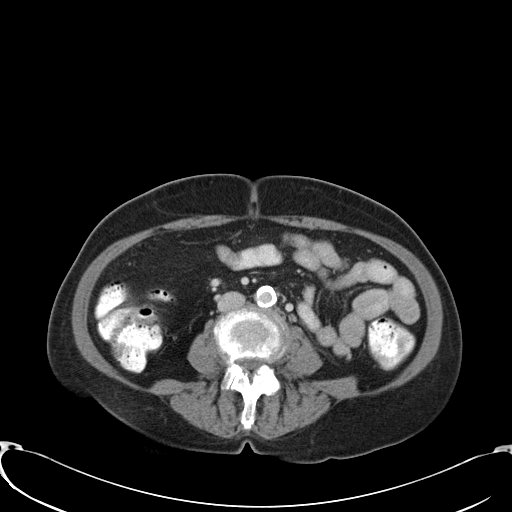
[im 55/88  soft-tissue]
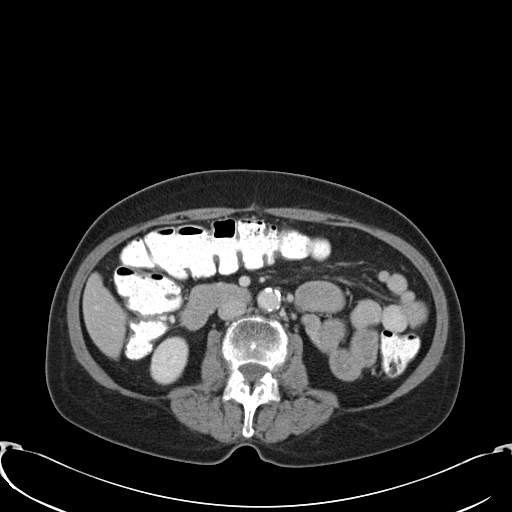
[im 55/88  bone]
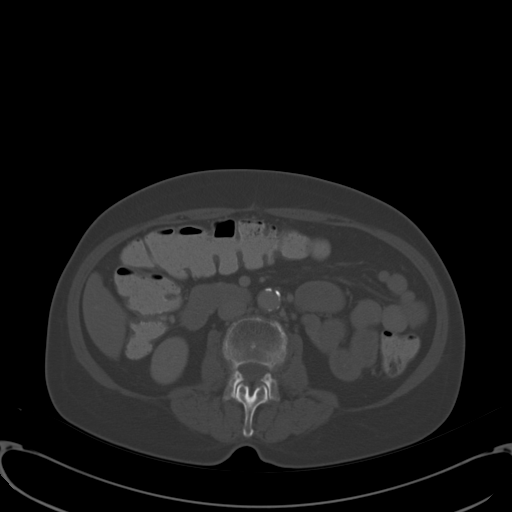
[im 60/88  soft-tissue]
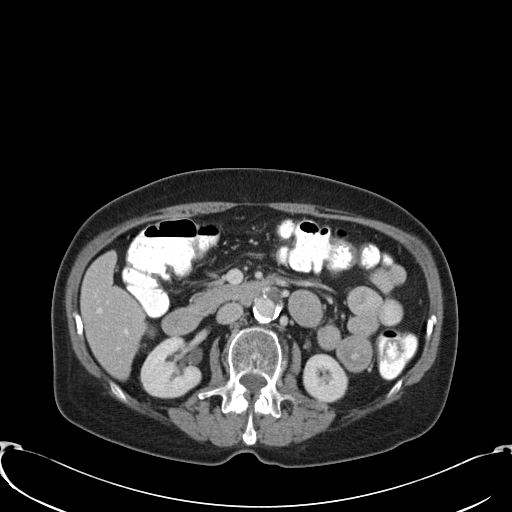
[im 66/88  soft-tissue]
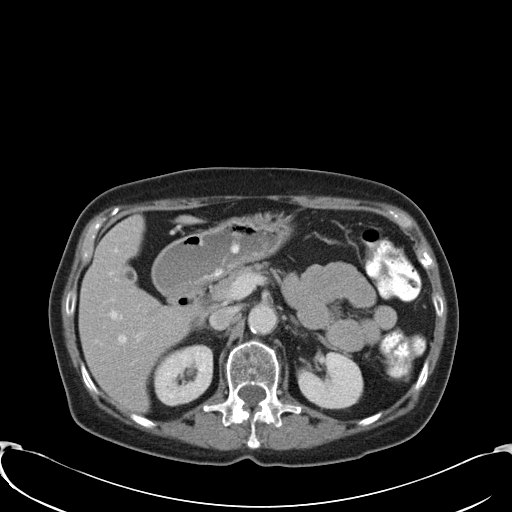
[im 71/88  soft-tissue]
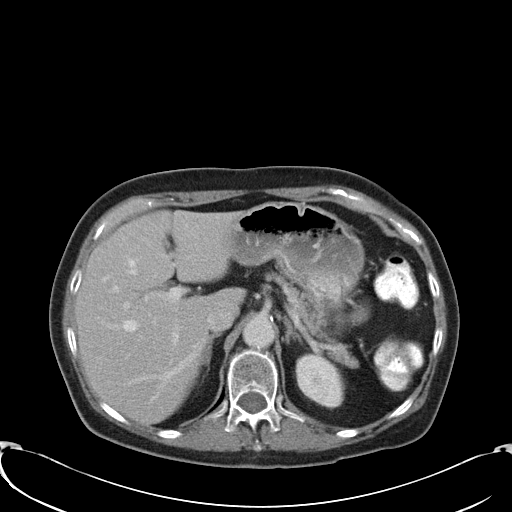
[im 77/88  soft-tissue]
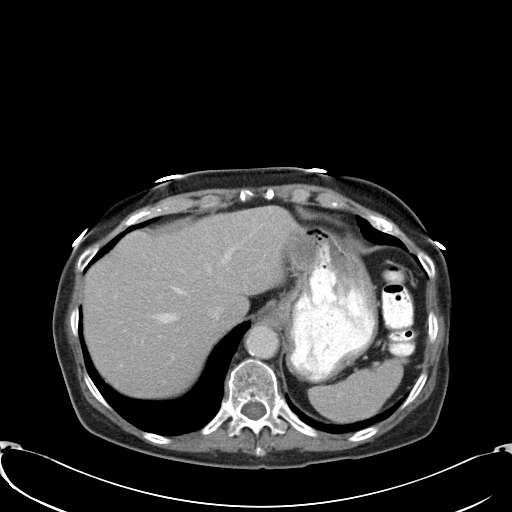
[im 82/88  soft-tissue]
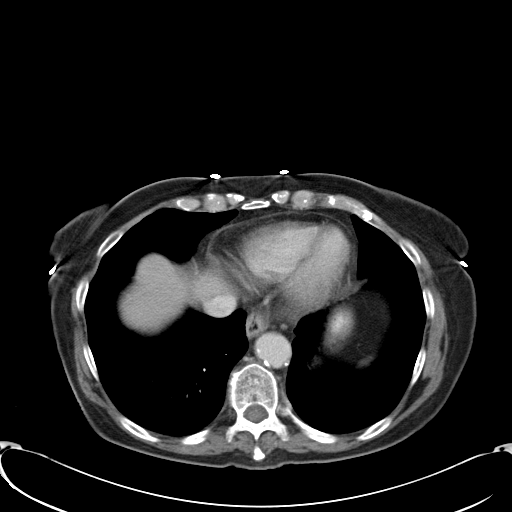

[Series 602: cor · coronal · 0.85mm/px · 3 of 101 slices shown]
[im 34/101  soft-tissue]
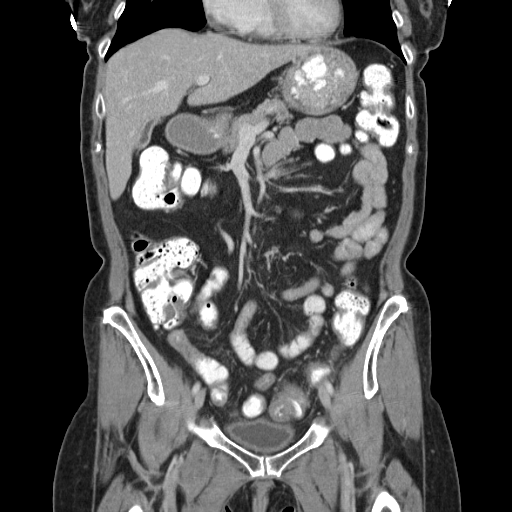
[im 45/101  soft-tissue]
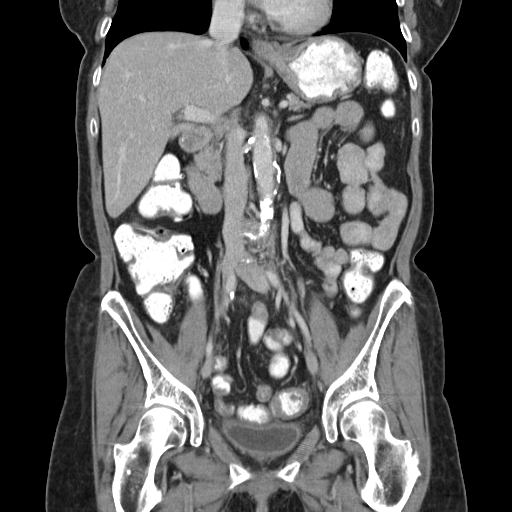
[im 56/101  soft-tissue]
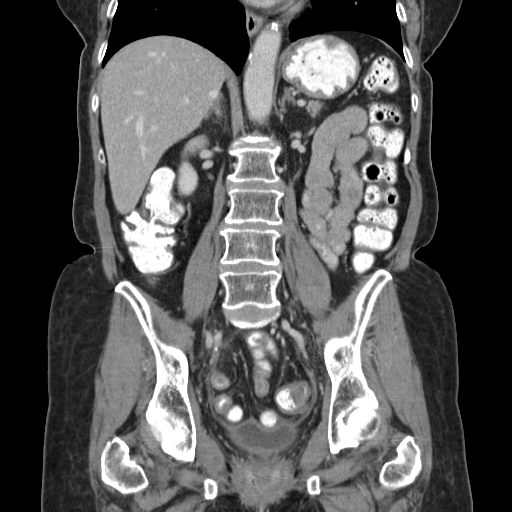

[17 of 46 positions shown; findings below may reference images not displayed]

FINDINGS: Lower chest: Unremarkable

Hepatobiliary: Contracted gallbladder.  Otherwise unremarkable

Pancreas: Unremarkable

Spleen: Unremarkable

Adrenals/Urinary Tract: Mild wall thickening in the urinary bladder
for the degree of distention. Otherwise unremarkable.

Stomach/Bowel: Mild wall thickening in the sigmoid colon, rectum,
and pelvic loops of small bowel, possibly from enterocolitis or
radiation related inflammation. Low-level presacral and perirectal
stranding and edema tracking along lower pelvic fat planes.

Vascular/Lymphatic: Focal ectasia of the infrarenal abdominal aorta
up to 2.9 cm with mural thrombus, image [DATE], similar to prior.
Aortoiliac atherosclerotic vascular disease. No pathologic
adenopathy.

Reproductive: Uterus and ovaries absent.

Other: No supplemental non-categorized findings.

Musculoskeletal: Degenerative disc disease with central disc
protrusion at the L1-2 level.
IMPRESSION: 1. Mild wall thickening of the urinary bladder and the pelvic loops
of small and large bowel, likely from radiation therapy. There is
also some accentuated stranding along the pelvic fat planes, again
likely a manifestation of local radiation therapy.
2. Stable focal ectasia of the infrarenal abdominal aorta up to
cm with mural thrombus. Aortoiliac atherosclerotic vascular disease.

## 2017-06-13 DIAGNOSIS — R921 Mammographic calcification found on diagnostic imaging of breast: Secondary | ICD-10-CM | POA: Diagnosis not present

## 2017-06-13 DIAGNOSIS — R922 Inconclusive mammogram: Secondary | ICD-10-CM | POA: Diagnosis not present

## 2017-06-13 LAB — HM MAMMOGRAPHY

## 2017-06-14 ENCOUNTER — Encounter: Payer: Self-pay | Admitting: Family Medicine

## 2017-06-20 ENCOUNTER — Ambulatory Visit: Payer: PPO | Admitting: Family Medicine

## 2017-06-20 ENCOUNTER — Encounter: Payer: Self-pay | Admitting: Family Medicine

## 2017-06-20 ENCOUNTER — Other Ambulatory Visit: Payer: Self-pay | Admitting: Family Medicine

## 2017-06-20 VITALS — BP 120/80 | HR 102 | Ht 63.0 in | Wt 125.8 lb

## 2017-06-20 DIAGNOSIS — R05 Cough: Secondary | ICD-10-CM | POA: Diagnosis not present

## 2017-06-20 DIAGNOSIS — R059 Cough, unspecified: Secondary | ICD-10-CM

## 2017-06-20 MED ORDER — AZITHROMYCIN 250 MG PO TABS: ORAL_TABLET | ORAL | 0 refills | Status: DC

## 2017-06-20 MED ORDER — IPRATROPIUM BROMIDE 0.06 % NA SOLN: 2.0000 | Freq: Four times a day (QID) | NASAL | 0 refills | Status: DC

## 2017-06-20 MED ORDER — BENZONATATE 100 MG PO CAPS: 100.0000 mg | ORAL_CAPSULE | Freq: Two times a day (BID) | ORAL | 0 refills | Status: DC | PRN

## 2017-06-20 NOTE — Progress Notes (Signed)
    Subjective:  Jacqueline Hammond is a 66 y.o. female who presents today with a chief complaint of cough.   HPI:  Cough, Acute Issue Started about 5 days ago. Worsened over that time.  Associated symptoms include sore throat that has since resolved, postnasal drip, nasal congestion, and headache.  Symptoms are worse at night when she is lying flat.  Cough will keep her up at night.  No fevers.  No known sick contacts.  She has tried Robitussin and Alka-Seltzer with minimal relief.  No other obvious aggravating or alleviating factors.  ROS: Per HPI  PMH: Smoking history reviewed.  Current smoker.  Objective:  Physical Exam: BP 120/80   Pulse (!) 102   Ht 5\' 3"  (1.6 m)   Wt 125 lb 12.8 oz (57.1 kg)   SpO2 98%   BMI 22.28 kg/m   Gen: NAD, resting comfortably HEENT: TMs with clear effusion bilaterally.  Oropharynx clear without erythema or exudate.  Nasal mucosa erythematous and boggy with clear nasal discharge bilaterally.  No lymphadenopathy. CV: RRR with no murmurs appreciated Pulm: NWOB, CTAB with no crackles, wheezes, or rhonchi  Assessment/Plan:  Cough, acute issue No signs of bacterial infection.  Likely related to viral URI.  Start Atrovent nasal spray.  Also start Tessalon as needed for cough.  Sent in a prescription for Z-Pak with strict instruction is when to start.  Advised patient that she should start if symptoms worsen or do not improve in the next 3-5 days.  Encouraged use of ibuprofen as needed for headache.  Encouraged good oral hydration.  Return precautions reviewed.  Follow-up as needed.  Algis Greenhouse. Jerline Pain, MD 06/20/2017 8:54 AM

## 2017-07-17 ENCOUNTER — Other Ambulatory Visit: Payer: Self-pay | Admitting: Family Medicine

## 2017-07-17 ENCOUNTER — Telehealth: Payer: Self-pay | Admitting: Family Medicine

## 2017-07-17 NOTE — Telephone Encounter (Signed)
Please advise on medication refill below.

## 2017-07-17 NOTE — Telephone Encounter (Signed)
Copied from [place] [phone]. Topic: Quick Communication - Rx Refill/Question >> Jul 17, 2017  9:54 AM Chauncey Mann A wrote: Has the patient contacted their pharmacy? No.   (Agent: If no, request that the patient contact the pharmacy for the refill.)   Preferred Pharmacy (with phone number or street name): Pt. Calling to see if she can get a refill of ipratropium (ATROVENT) 0.06 % nasal spray, pt is having similar symptoms to last month with nasal congestion (+4 days). Please call RX into [facility].    Agent: Please be advised that RX refills may take up to 3 business days. We ask that you follow-up with your pharmacy.

## 2017-07-18 NOTE — Telephone Encounter (Signed)
Yes thanks may refill 

## 2017-07-19 ENCOUNTER — Telehealth: Payer: Self-pay | Admitting: Family Medicine

## 2017-07-19 ENCOUNTER — Other Ambulatory Visit: Payer: Self-pay

## 2017-07-19 ENCOUNTER — Other Ambulatory Visit: Payer: Self-pay | Admitting: Family Medicine

## 2017-07-19 MED ORDER — IPRATROPIUM BROMIDE 0.06 % NA SOLN: NASAL | 0 refills | Status: DC

## 2017-07-19 NOTE — Telephone Encounter (Signed)
Refill sent to pharmacy.   

## 2017-07-19 NOTE — Telephone Encounter (Signed)
Pt called requesting information about prescription refill for ipratropium nasal spray; per Dr Arrie Aran note, refill request sent to [facility] [address]; pt verifies understanding.

## 2017-08-17 ENCOUNTER — Other Ambulatory Visit: Payer: Self-pay

## 2017-08-17 ENCOUNTER — Encounter: Payer: Self-pay | Admitting: Radiation Oncology

## 2017-08-17 ENCOUNTER — Ambulatory Visit
Admission: RE | Admit: 2017-08-17 | Discharge: 2017-08-17 | Disposition: A | Discharge status: Home / Self Care | Payer: PPO | Source: Ambulatory Visit | Attending: Radiation Oncology | Admitting: Radiation Oncology

## 2017-08-17 DIAGNOSIS — Z8542 Personal history of malignant neoplasm of other parts of uterus: Secondary | ICD-10-CM | POA: Diagnosis not present

## 2017-08-17 DIAGNOSIS — Z08 Encounter for follow-up examination after completed treatment for malignant neoplasm: Secondary | ICD-10-CM | POA: Insufficient documentation

## 2017-08-17 DIAGNOSIS — C541 Malignant neoplasm of endometrium: Secondary | ICD-10-CM

## 2017-08-17 DIAGNOSIS — Z79899 Other long term (current) drug therapy: Secondary | ICD-10-CM | POA: Diagnosis not present

## 2017-08-17 NOTE — Progress Notes (Signed)
Jacqueline Hammond is here for follow up.  She denies having any pain, urinary issues,  Vaginal bleeding or discharge.  She reports having occasional diarrhea depending on what she eats.  She reports having a good appetite and energy level.  She is not using a vaginal dilator.  BP 123/90 (BP Location: Right Arm, Patient Position: Sitting)   Pulse 79   Temp 98.3 F (36.8 C) (Oral)   Ht 5\' 3"  (1.6 m)   Wt 127 lb (57.6 kg)   SpO2 99%   BMI 22.50 kg/m    Wt Readings from Last 3 Encounters:  08/17/17 127 lb (57.6 kg)  06/20/17 125 lb 12.8 oz (57.1 kg)  05/15/17 127 lb 3.2 oz (57.7 kg)

## 2017-08-17 NOTE — Progress Notes (Signed)
  Radiation Oncology         [phone] ________________________________ jj Name: Jacqueline Hammond MRN: [id number]  Date: 08/17/2017  DOB: 1950-09-20  Follow-Up Visit Note  CC: Marin Olp, MD  Dorothyann Gibbs, NP    ICD-10-CM   1. Endometrial cancer, FIGO stage IIIC (HCC) C54.1     Diagnosis:   Stage IIIC1 high grade serous carcinoma of endometrium  Interval Since Last Radiation:  17 months  1) 01/19/16-02/23/16: Pelvis/ 45 Gy in 25 fx 2) 03/08/16, 03/17/16, 03/24/16: Vaginal cuff brachytherapy/ 18 Gy in 3 fx  Narrative:  The patient returns today for routine follow-up. She was last seen by Dr. Denman George on 05/15/17. No evidence of recurrence on that exam.   She reports having occasional diarrhea depending on what she eats. She reports these episodes of diarrhea occurred prior to her diagnosis. She denies pain, urinary issues, vaginal bleeding or discharge. She reports a good appetite and energy level. She is not using a vaginal dilator.   ALLERGIES:  is allergic to codeine sulfate and neomycin-bacitracin zn-polymyx.  Meds: Current Outpatient Medications  Medication Sig Dispense Refill  . atorvastatin (LIPITOR) 20 MG tablet TAKE 1 TABLET BY MOUTH EVERY DAY 30 tablet 5  . b complex vitamins capsule Take 1 capsule by mouth daily.    . Multiple Vitamin (MULTIVITAMIN) tablet Take 1 tablet by mouth daily.    . traZODone (DESYREL) 50 MG tablet Take 0.5-1 tablets (25-50 mg total) by mouth at bedtime as needed for sleep. 30 tablet 3   Current Facility-Administered Medications  Medication Dose Route Frequency Provider Last Rate Last Dose  . 0.9 %  sodium chloride infusion  500 mL Intravenous Continuous Irene Shipper, MD        Physical Findings: The patient is in no acute distress. Patient is alert and oriented.  height is 5\' 3"  (1.6 m) and weight is 127 lb (57.6 kg). Her oral temperature is 98.3 F (36.8 C). Her blood pressure is 123/90 and her pulse is 79. Her oxygen saturation  is 99%. .  Lungs are clear to auscultation bilaterally. Heart has regular rate and rhythm. No palpable cervical, supraclavicular, or axillary adenopathy. Abdomen soft, non-tender, normal bowel sounds. On pelvic examination the external genitalia were unremarkable. A speculum exam was performed. There are no mucosal lesions noted in the vaginal vault. On bimanual and rectovaginal examination there were no pelvic masses appreciated.   Lab Findings: Lab Results  Component Value Date   WBC 11.3 (H) 09/27/2016   HGB 12.5 09/27/2016   HCT 37.6 09/27/2016   MCV 105.0 (H) 09/27/2016   PLT 321.0 09/27/2016    Radiographic Findings: No results found.  Impression:  No evidence of recurrence on clinical exam.  Plan:  Routine follow up in 6 months. Patient will see Dr. Denman George in 3 months.  -----------------------------------  Blair Promise, PhD, MD  This document serves as a record of services personally performed by Gery Pray, MD. It was created on his behalf by Bethann Humble, a trained medical scribe. The creation of this record is based on the scribe's personal observations and the provider's statements to them. This document has been checked and approved by the attending provider.

## 2017-08-22 ENCOUNTER — Ambulatory Visit: Payer: PPO | Admitting: *Deleted

## 2017-10-08 ENCOUNTER — Other Ambulatory Visit: Payer: Self-pay | Admitting: Family Medicine

## 2017-11-06 ENCOUNTER — Telehealth: Payer: Self-pay | Admitting: *Deleted

## 2017-11-06 NOTE — Telephone Encounter (Signed)
Patient called back and appt moved from April 18th to May 2nd

## 2017-11-06 NOTE — Telephone Encounter (Signed)
Called and left the patient a message to call the office. Need to move the appt from April 18th to May 2nd. DrMarland Kitchen Denman George will be out of the office

## 2017-11-07 ENCOUNTER — Encounter: Payer: Self-pay | Admitting: Family Medicine

## 2017-11-07 ENCOUNTER — Ambulatory Visit (INDEPENDENT_AMBULATORY_CARE_PROVIDER_SITE_OTHER): Payer: PPO | Admitting: Family Medicine

## 2017-11-07 VITALS — BP 106/78 | HR 77 | Temp 98.1°F | Ht 63.0 in | Wt 132.0 lb

## 2017-11-07 DIAGNOSIS — I77811 Abdominal aortic ectasia: Secondary | ICD-10-CM

## 2017-11-07 DIAGNOSIS — C775 Secondary and unspecified malignant neoplasm of intrapelvic lymph nodes: Secondary | ICD-10-CM | POA: Diagnosis not present

## 2017-11-07 DIAGNOSIS — G620 Drug-induced polyneuropathy: Secondary | ICD-10-CM

## 2017-11-07 DIAGNOSIS — Z0000 Encounter for general adult medical examination without abnormal findings: Secondary | ICD-10-CM | POA: Diagnosis not present

## 2017-11-07 DIAGNOSIS — Z23 Encounter for immunization: Secondary | ICD-10-CM

## 2017-11-07 DIAGNOSIS — T451X5A Adverse effect of antineoplastic and immunosuppressive drugs, initial encounter: Secondary | ICD-10-CM | POA: Diagnosis not present

## 2017-11-07 DIAGNOSIS — E785 Hyperlipidemia, unspecified: Secondary | ICD-10-CM

## 2017-11-07 DIAGNOSIS — Z720 Tobacco use: Secondary | ICD-10-CM

## 2017-11-07 NOTE — Assessment & Plan Note (Signed)
Encouraged complete cessation. Nervous about hallucinations on chantix. Has tried multiple nicotine replacements in the past. Uses as a crutch.   We discussed ct lung cancer screening- she would prefer to rediscuss once released from oncology for endometrial cancer

## 2017-11-07 NOTE — Assessment & Plan Note (Signed)
By CT 10-28-15 was 2.9 cm and noted mural thrombus. Consider repeat imaging in 3-4 years. For stability

## 2017-11-07 NOTE — Progress Notes (Signed)
Phone: [phone]  Subjective:  Patient presents today for their Welcome to Medicare Exam    Preventive Screening-Counseling & Management  Vision screen: largely normal  Visual Acuity Screening   Right eye Left eye Both eyes  Without correction:     With correction: 20/25 20/25 20/20     Advanced directives: Received paperwork for living will and HCPOA from hospital. We discussed completing this.    Smoking Status: former Smoker.continues to smoke Second Hand Smoking status: No smokers in home  Risk Factors Regular exercise: 5 days a week Diet: reasonable  Fall Risk: None  Fall Risk  11/07/2017 08/17/2017 02/09/2017 08/11/2016  Falls in the past year? No No No No   Opioid use history: no long term opioids use  Cardiac risk factors:  advanced age (older than 80 for men, 51 for women)  Hyperlipidemia - yes No diabetes No hypertension.  Family History: heart attack in father at age 78 but was smoker and PTSD/war vet and alcoholic Current smoker    Depression Screen None. PHQ2 0  Depression screen [facility] 2/9 11/07/2017 08/17/2017 02/09/2017 08/11/2016  Decreased Interest 0 0 0 0  Down, Depressed, Hopeless 0 0 0 0  PHQ - 2 Score 0 0 0 0    Activities of Daily Living Independent ADLs and IADLs   Hearing Difficulties: -patient declines  Cognitive Testing No reported trouble.    Normal 3 word recall  List the Names of Other Physician/Practitioners you currently use: -Dr. Denman George oncology, Dr. Marko Plume oncology, Dr. Sondra Come oncology, Dr. Isaiah Blakes for mammography, Dr. Henrene Pastor for GI, Dr. Fuller Plan optho  Surgery in last year- radical hysterectomy in 2017 as well as chemo and radiation completing in sptember 2017  Immunization History  Administered Date(s) Administered  . Influenza Split 04/12/2012  . Influenza,inj,Quad PF,6+ Mos 07/14/2015, 06/30/2016  . Influenza-Unspecified 08/15/2014  . Pneumococcal Conjugate-13 11/07/2017  . Td 11/12/2009  . Zoster 12/04/2012    Required Immunizations needed today - discussed shingrix option at pharmacy. She received prevnar 13 today.   Screening tests- up to date Colonoscopy with adenoma in 2018 10/27/15 dexa- may consider repeat 2020 Needs to get back to seeing Dr. Allyson Sabal- with history of melanoma  ROS- No pertinent positives discovered in course of AWV ROS pertinent to HLD - No chest pain or shortness of breath. No headache or blurry vision.    The following were reviewed and entered/updated in epic: Past Medical History:  Diagnosis Date  . Adenocarcinoma of endometrium ([place]) 09/2015  . Allergy    mild seasonal  . Atypical glandular cells of undetermined significance (AGUS) on cervical Pap smear 09/2015  . GERD (gastroesophageal reflux disease)   . Heart murmur    as teenager but not mentioned since  . History of chemotherapy    completed nov 2017  . History of radiation therapy 01/19/16-03/24/16   pelvis 45 Gy in 25 fx, vaginal cuff 18 Gy in 3 fx completed nov 2017  . Hyperlipidemia   . Osteopenia 09/2015   T score -1.4 FRAX 8.5%/0.8%   Patient Active Problem List   Diagnosis Date Noted  . Continuous tobacco abuse 11/13/2015    Priority: High  . Endometrial cancer, FIGO stage IIIC ([place]) 11/09/2015    Priority: High  . Osteopenia determined by x-ray 11/13/2015    Priority: Medium  . Ectatic abdominal aorta ([place]) 11/13/2015    Priority: Medium  . Hx of adenomatous colonic polyps 11/13/2015    Priority: Medium  . Hyperlipidemia 05/08/2008    Priority:  Medium  . Family history of melanoma 10/10/2014    Priority: Low  . Former smoker 10/10/2014    Priority: Low  . Insomnia 10/10/2014    Priority: Low  . Hematuria 12/04/2012    Priority: Low  . Chemotherapy-induced peripheral neuropathy ([place]) 04/09/2016  . Chemotherapy induced neutropenia ([place]) 01/15/2016  . Restless legs 12/18/2015  . Constipation 11/28/2015  . Macrocytosis without anemia 11/13/2015  . Secondary malignant neoplasm of  cervix ([place]) 11/09/2015  . 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 COLONOSCOPY    . IR GENERIC HISTORICAL  03/02/2016   IR US GUIDE VASC ACCESS RIGHT 03/02/2016 Arne Cleveland, MD [facility]  . IR GENERIC HISTORICAL  03/02/2016   IR FLUORO GUIDE CV LINE RIGHT 03/02/2016 Arne Cleveland, MD [facility]  . LAPAROSCOPY    . POLYPECTOMY    . ROBOTIC ASSISTED TOTAL HYSTERECTOMY WITH BILATERAL SALPINGO OOPHERECTOMY  10/29/15   robotic hysterectomy, BSO and sentinel lymph node biopsy of pelvic and PA nodes at [facility] with Dr Clarene Essex.   . TUBAL LIGATION      Family History  Problem Relation Age of Onset  . Cancer Mother        larynx  . Heart failure Father   . Heart attack Father 71       smoker, war vet  . Melanoma Father   . Heart attack Brother 27  . Colon polyps Brother   . Colon cancer Paternal Grandmother 60  . Colon polyps Sister   . Esophageal cancer Neg Hx   . Rectal cancer Neg Hx   . Stomach cancer Neg Hx     Medications- reviewed and updated Current Outpatient Medications  Medication Sig Dispense Refill  . atorvastatin (LIPITOR) 20 MG tablet TAKE 1 TABLET BY MOUTH EVERY DAY 90 tablet 0  . b complex vitamins capsule Take 1 capsule by mouth daily.    . Multiple Vitamin (MULTIVITAMIN) tablet Take 1 tablet by mouth daily.    . traZODone (DESYREL) 50 MG tablet Take 0.5-1 tablets (25-50 mg total) by mouth at bedtime as needed for sleep. 30 tablet 3   No current facility-administered medications for this visit.     Allergies-reviewed and updated Allergies  Allergen Reactions  . Codeine Sulfate     REACTION: nausea, pass out  . Neomycin-Bacitracin Zn-Polymyx     REACTION: redness    Social History   Socioeconomic History  . Marital status: Married    Spouse name: Not on file  . Number of children: 2  . Years of education: Not on file  . Highest education level: Not on file  Occupational  History  . Not on file  Social Needs  . Financial resource strain: Not on file  . Food insecurity:    Worry: Not on file    Inability: Not on file  . Transportation needs:    Medical: Not on file    Non-medical: Not on file  Tobacco Use  . Smoking status: Current Some Day Smoker    Packs/day: 1.00    Years: 32.00    Pack years: 32.00    Types: Cigarettes  . Smokeless tobacco: Never Used  . Tobacco comment: 3 cigarettes/ day as of 06-30-16  Substance and Sexual Activity  . Alcohol use: No    Alcohol/week: 0.0 oz  . Drug use: No  . Sexual activity: Yes    Comment: 1st intercourse 81 yo-1 partner  Lifestyle  . Physical activity:    Days per week: Not on file    Minutes per session: Not on file  . Stress: Not on file  Relationships  . Social connections:    Talks on phone: Not on file    Gets together: Not on file    Attends religious service: Not on file    Active member of club or organization: Not on file    Attends meetings of clubs or organizations: Not on file    Relationship status: Not on file  Other Topics Concern  . Not on file  Social History Narrative   Married 1970 ([name] is a patient here). 2 sons. 3 grandchildren. 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 Oropharynx normal Neck: no thyromegaly CV: RRR no murmurs rubs or gallops Lungs: CTAB no crackles, wheeze, rhonchi Abdomen: soft/nontender/nondistended/normal bowel sounds. No rebound or guarding.  Ext: no  edema Skin: warm, dry Neuro: grossly normal, moves all extremities, PERRLA  Assessment/Plan:  Welcome to Medicare exam completed- discussed recommended screenings anddocumented any personalized health advice and referrals for preventive counseling. See AVS as well which was given to patient.    Status of chronic or acute concerns   Endometrial cancer, FIGO stage IIIC ([place])- also with history of metastasis and neuropathy from chemotherapy- she has done incredibly well. She has finished all treatments in 2017- has one more year of monitoring with oncology. Still with neuropathy from chemotherapy. Will need to change to history of endometrial cancer in future when released  Hyperlipidemia Back on atorvastatin 20mg  and doing well. Some mild myalgias at times.   Insomnia Very sparing trazodone- still using. helpful  Continuous tobacco abuse Encouraged complete cessation. Nervous about hallucinations on chantix. Has tried multiple nicotine replacements in the past. Uses as a crutch.   We discussed ct lung cancer screening- she would prefer to rediscuss once released from oncology for endometrial cancer  Ectatic abdominal aorta ([place]) By CT 10-28-15 was 2.9 cm and noted mural thrombus. Consider repeat imaging in 3-4 years. For stability   Future Appointments  Date Time Provider [place]  11/30/2017  1:15 PM Everitt Amber, MD [facility] None  02/15/2018 11:00 AM Gery Pray, MD [facility] None    Lab/Order associations: Preventative health care  Need for prophylactic vaccination against Streptococcus pneumoniae (pneumococcus) - Plan: Pneumococcal conjugate vaccine 13-valent IM  Hyperlipidemia, unspecified hyperlipidemia type - Plan: Lipid panel, CBC, Comprehensive metabolic panel, POCT Urinalysis Dipstick (Automated)  Return precautions advised. [name], MD

## 2017-11-07 NOTE — Patient Instructions (Addendum)
Ms. Jacqueline Hammond , Thank you for taking time to come for your Medicare Wellness Visit. I appreciate your ongoing commitment to your health goals. Please review the following plan we discussed and let me know if I can assist you in the future.   These are the goals we discussed: 1. Zostavax which you had in 2014 is 50% effective at reducing risk of shingles 2. Shingrix (the newer vaccine) is 90% effective with 2 shots within 6 months. Can get this at your pharmacy if they have it when you decide.  3. Schedule a lab visit at the check out desk within 2 weeks. Return for future fasting labs meaning nothing but water after midnight please. Ok to take your medications with water.  4. Please consider quitting smoking- one of the best things you can do for your health.  5. Please call Dr. Eula Listen office for follow up   This is a list of the screening recommended for you and due dates:  Health Maintenance  Topic Date Due  .  Hepatitis C: One time screening is recommended by Center for Disease Control  (CDC) for  adults born from [birth year] through 1965.   09/29/2048*  . HIV Screening  10/31/2058*  . Flu Shot  03/01/2018  . Mammogram  06/13/2018  . Pap Smear  10/01/2018  . Pneumonia vaccines (2 of 2 - PPSV23) 11/08/2018  . Tetanus Vaccine  11/13/2019  . Colon Cancer Screening  02/20/2022  . DEXA scan (bone density measurement)  Completed  *Topic was postponed. The date shown is not the original due date.

## 2017-11-16 ENCOUNTER — Ambulatory Visit: Payer: PPO | Admitting: Gynecologic Oncology

## 2017-11-21 ENCOUNTER — Ambulatory Visit (INDEPENDENT_AMBULATORY_CARE_PROVIDER_SITE_OTHER): Payer: PPO | Admitting: Family Medicine

## 2017-11-21 ENCOUNTER — Encounter: Payer: Self-pay | Admitting: Family Medicine

## 2017-11-21 DIAGNOSIS — E785 Hyperlipidemia, unspecified: Secondary | ICD-10-CM

## 2017-11-21 LAB — CBC
HEMATOCRIT: 40.7 % (ref 36.0–46.0)
Hemoglobin: 13.8 g/dL (ref 12.0–15.0)
MCHC: 33.9 g/dL (ref 30.0–36.0)
MCV: 104.1 fl — AB (ref 78.0–100.0)
Platelets: 302 10*3/uL (ref 150.0–400.0)
RBC: 3.91 Mil/uL (ref 3.87–5.11)
RDW: 13.6 % (ref 11.5–15.5)
WBC: 9.2 10*3/uL (ref 4.0–10.5)

## 2017-11-21 LAB — POC URINALSYSI DIPSTICK (AUTOMATED)
Bilirubin, UA: NEGATIVE
Glucose, UA: NEGATIVE
Ketones, UA: NEGATIVE
NITRITE UA: NEGATIVE
PROTEIN UA: NEGATIVE
SPEC GRAV UA: 1.025 (ref 1.010–1.025)
UROBILINOGEN UA: 0.2 U/dL
pH, UA: 5.5 (ref 5.0–8.0)

## 2017-11-21 LAB — COMPREHENSIVE METABOLIC PANEL
ALT: 14 U/L (ref 0–35)
AST: 19 U/L (ref 0–37)
Albumin: 4.2 g/dL (ref 3.5–5.2)
Alkaline Phosphatase: 78 U/L (ref 39–117)
BILIRUBIN TOTAL: 0.5 mg/dL (ref 0.2–1.2)
BUN: 10 mg/dL (ref 6–23)
CHLORIDE: 105 meq/L (ref 96–112)
CO2: 31 meq/L (ref 19–32)
CREATININE: 0.9 mg/dL (ref 0.40–1.20)
Calcium: 9.7 mg/dL (ref 8.4–10.5)
GFR: 66.62 mL/min (ref >60.00)
GLUCOSE: 87 mg/dL (ref 70–99)
Potassium: 4.6 mEq/L (ref 3.5–5.1)
SODIUM: 139 meq/L (ref 135–145)
Total Protein: 6.8 g/dL (ref 6.0–8.3)

## 2017-11-21 LAB — LIPID PANEL
CHOLESTEROL: 190 mg/dL (ref 0–200)
HDL: 63.6 mg/dL (ref >39.00)
LDL CALC: 114 mg/dL — AB (ref 0–99)
NonHDL: 126.54
Total CHOL/HDL Ratio: 3
Triglycerides: 65 mg/dL (ref 0.0–149.0)
VLDL: 13 mg/dL (ref 0.0–40.0)

## 2017-11-21 NOTE — Progress Notes (Signed)
Patient was meant to be scheduled for lab only visit, but placed on my schedule. Will place LOS no charge.

## 2017-11-21 NOTE — Addendum Note (Signed)
Addended by: Frutoso Chase A on: 11/21/2017 11:21 AM   Modules accepted: Orders

## 2017-11-21 NOTE — Progress Notes (Signed)
Urine shows possible blood. Team- Please have her come back and do a urine microscopic under hematuria as well as urine culture under leukocytes in urine. Please let me know if she is having urinary symptoms like frequency, urgency, burning.  Your CBC was largely normal (blood counts, infection fighting cells, platelets) other than your cell size being large which is often caused by smoking.  your CMET was normal (kidney, liver, and electrolytes, blood sugar).  Your cholesterol is significantly improved from last year.  Bad cholesterol is still not quite at goal though.  Team- I would recommend increasing atorvastatin to 40 mg for daily use #90 with 3 refills if she agrees.

## 2017-11-21 NOTE — Addendum Note (Signed)
Addended by: Frutoso Chase A on: 11/21/2017 11:12 AM   Modules accepted: Orders

## 2017-11-21 NOTE — Patient Instructions (Signed)
Please stop by lab before you go   

## 2017-11-22 ENCOUNTER — Other Ambulatory Visit: Payer: Self-pay

## 2017-11-22 DIAGNOSIS — R82998 Other abnormal findings in urine: Secondary | ICD-10-CM

## 2017-11-22 DIAGNOSIS — R319 Hematuria, unspecified: Secondary | ICD-10-CM

## 2017-11-22 MED ORDER — ATORVASTATIN CALCIUM 40 MG PO TABS: 40.0000 mg | ORAL_TABLET | Freq: Every day | ORAL | 3 refills | Status: DC

## 2017-11-22 MED ORDER — ATORVASTATIN CALCIUM 20 MG PO TABS: 20.0000 mg | ORAL_TABLET | Freq: Every day | ORAL | 3 refills | Status: DC

## 2017-11-23 ENCOUNTER — Other Ambulatory Visit (INDEPENDENT_AMBULATORY_CARE_PROVIDER_SITE_OTHER): Payer: PPO

## 2017-11-23 DIAGNOSIS — R319 Hematuria, unspecified: Secondary | ICD-10-CM

## 2017-11-23 DIAGNOSIS — R82998 Other abnormal findings in urine: Secondary | ICD-10-CM | POA: Diagnosis not present

## 2017-11-23 LAB — URINALYSIS, MICROSCOPIC ONLY

## 2017-11-24 ENCOUNTER — Encounter: Payer: Self-pay | Admitting: Family Medicine

## 2017-11-24 ENCOUNTER — Other Ambulatory Visit: Payer: Self-pay

## 2017-11-24 ENCOUNTER — Telehealth: Payer: Self-pay

## 2017-11-24 ENCOUNTER — Telehealth: Payer: Self-pay | Admitting: Family Medicine

## 2017-11-24 DIAGNOSIS — R3129 Other microscopic hematuria: Secondary | ICD-10-CM

## 2017-11-24 LAB — URINE CULTURE
MICRO NUMBER:: 90507108
SPECIMEN QUALITY:: ADEQUATE

## 2017-11-24 NOTE — Telephone Encounter (Signed)
Called patient and gave her lab results to her. I also put in her referral to [facility]. Patient verbalized understanding.

## 2017-11-24 NOTE — Telephone Encounter (Signed)
Ive routed message to our referral coordinator

## 2017-11-24 NOTE — Telephone Encounter (Signed)
This encounter was created in error - please disregard.

## 2017-11-24 NOTE — Telephone Encounter (Signed)
I have routed this message to Johny Shock, our referral coordinator. I did put the referral in to [facility] though.

## 2017-11-24 NOTE — Telephone Encounter (Signed)
Copied from [place] [phone]. Topic: Referral - Request >> Nov 24, 2017  4:04 PM Neva Seat wrote: Pt has been told she was getting referred to [facility] w/ Dr. Su Grand.  She has checked w/ them and they are telling her they haven't received the referral. Pt is needing the referral asap.

## 2017-11-24 NOTE — Telephone Encounter (Signed)
Pt called with questions re: urology referral:  Pt would like referral with [facility] with Dr. Baruch Gouty as her husband sees this provider. Pt requesting appt ASAP.

## 2017-11-24 NOTE — Progress Notes (Signed)
UTI could be cause of blood in urine- lets treat her with keflex 500mg  three times a day for 7 days #21. Please send this in for her. In 10 days order urine microscopic under microscopic hematuria for repeat check. If still blood at that time- she can keep urology visit (if blood clears she can cancel it)

## 2017-11-27 ENCOUNTER — Telehealth: Payer: Self-pay

## 2017-11-27 ENCOUNTER — Other Ambulatory Visit: Payer: Self-pay

## 2017-11-27 DIAGNOSIS — N390 Urinary tract infection, site not specified: Secondary | ICD-10-CM

## 2017-11-27 DIAGNOSIS — R319 Hematuria, unspecified: Principal | ICD-10-CM

## 2017-11-27 MED ORDER — CEPHALEXIN 500 MG PO CAPS: 500.0000 mg | ORAL_CAPSULE | Freq: Three times a day (TID) | ORAL | 0 refills | Status: AC

## 2017-11-27 NOTE — Telephone Encounter (Signed)
Called patient and left a message to call office back.  

## 2017-11-27 NOTE — Telephone Encounter (Signed)
Copied from [place] [id number]. Topic: General - Other >> Nov 27, 2017  9:19 AM Yvette Rack wrote: Reason for CRM: patient calling about the RX for keflex 500mg  she states that it was never called into her pharmacy and she need to start on this medicine as soon as possible

## 2017-11-27 NOTE — Telephone Encounter (Signed)
Medication has been sent in to her [place]

## 2017-11-30 ENCOUNTER — Encounter: Payer: Self-pay | Admitting: Gynecologic Oncology

## 2017-11-30 ENCOUNTER — Inpatient Hospital Stay: Payer: PPO | Attending: Gynecologic Oncology | Admitting: Gynecologic Oncology

## 2017-11-30 VITALS — BP 119/73 | HR 81 | Temp 98.3°F | Resp 20 | Ht 63.0 in | Wt 129.4 lb

## 2017-11-30 DIAGNOSIS — F17210 Nicotine dependence, cigarettes, uncomplicated: Secondary | ICD-10-CM | POA: Diagnosis not present

## 2017-11-30 DIAGNOSIS — Z90722 Acquired absence of ovaries, bilateral: Secondary | ICD-10-CM | POA: Diagnosis not present

## 2017-11-30 DIAGNOSIS — C7982 Secondary malignant neoplasm of genital organs: Secondary | ICD-10-CM

## 2017-11-30 DIAGNOSIS — Z9221 Personal history of antineoplastic chemotherapy: Secondary | ICD-10-CM | POA: Diagnosis not present

## 2017-11-30 DIAGNOSIS — C541 Malignant neoplasm of endometrium: Secondary | ICD-10-CM

## 2017-11-30 DIAGNOSIS — Z923 Personal history of irradiation: Secondary | ICD-10-CM | POA: Diagnosis not present

## 2017-11-30 DIAGNOSIS — Z90710 Acquired absence of both cervix and uterus: Secondary | ICD-10-CM | POA: Insufficient documentation

## 2017-11-30 DIAGNOSIS — R3129 Other microscopic hematuria: Secondary | ICD-10-CM | POA: Diagnosis not present

## 2017-11-30 DIAGNOSIS — Z8542 Personal history of malignant neoplasm of other parts of uterus: Secondary | ICD-10-CM | POA: Diagnosis not present

## 2017-11-30 NOTE — Patient Instructions (Signed)
Please notify Dr Denman George at phone number [phone] if you notice vaginal bleeding, new pelvic or abdominal pains, bloating, feeling full easy, or a change in bladder or bowel function.   Please return to see Dr Denman George in October, as scheduled.

## 2017-11-30 NOTE — Progress Notes (Signed)
Follow-up Note: Gyn-Onc   Jacqueline Hammond 67 y.o. female  Chief Complaint  Patient presents with  . Secondary malignant neoplasm of cervix ([place])    Assessment :Stage IIIC1 (with ITC's) serous high grade endometrial adenocarcioma. Cervical stromal involvement. s/p adjuvant therapy (chemotherapy and radiation) completed in September, 2017 with no evidence of recurrence.  Plan   She has no significant persistent toxicities to therapy. Post treatment imaging unremarkable.  Recommend seeing her every 3 months for physical exam, pelvic exam and symptom assessment. I discussed symptoms concerning for maligancy and notified her that she should inform us of these should they develop.  Dr Sondra Come will see her in July, 2019 and I will see her in October, 2019. After that point I will see her every 6 months.  The patient was explained that sometimes post-radiation hematuria and frequency can be secondary to radiation cystitis.  HPI: 67 year old white married female gravida 2 seen in consultation request of Dr. Abbie Sons regarding management of a newly diagnosed endometrial carcinoma. The patient initially had some spotting and a Pap smear revealing atypical glandular cells. She underwent sonohysterogram and endometrial biopsy on 10/19/2015 revealing an endometrial adenocarcinoma with serous features (high-grade) the patient is currently not having any bleeding and has no pelvic symptoms. The uterus measured 7.4 x 3.5 x 3.0 cm with an endometrial stripe of 6.8 mm.  She has no significant past gynecologic history although she had a tubal ligation through a Pfannenstiel incision. Subsequently she underwent diagnostic laparoscopy and lysis of adhesions for pelvic pain which is now resolved. She claims her Pap smears the past and been normal. She has no other significant medical history.  On 10/29/15 she underwent a robotic hysterectomy, BSO and sentinel lymph node biopsy of pelvic and PA nodes at [facility]  with Dr Clarene Essex. Surgery was uncomplicated. Postoperative pathology revealed a 85mm tumor of high grade serous adenocarcinoma involving the cervical stroma with no LVSI. There was outer half myometrial infiltration (12 of 110mm). The left and right pelvic SLN's were positive for ITC's on IHC.  Interval Hx:   She commenced "sandwich" adjuvant therapy with 6 cycles (total) of chemotherapy with carboplatin and paclitaxel completed on 04/21/16, and pelvic external beam radiation (with brachytherapy) completed on 02-23-16.  She did very well during treatment.   Persistent toxicities include mild neuropathy. Diarrhea has resolved.  She had microscopic hematuria at wellness visit - treated with empiric keflex (if present after treatment will have referral to urology).  Review of Systems:10 point review of systems is negative except as noted in interval history.   Vitals: Blood pressure 119/73, pulse 81, temperature 98.3 F (36.8 C), temperature source Oral, resp. rate 20, height 5\' 3"  (1.6 m), weight 129 lb 6.4 oz (58.7 kg), SpO2 100 %.  Physical Exam: General : The patient is a healthy woman in no acute distress.  HEENT: normocephalic, extraoccular movements normal; neck is supple without thyromegally  Lynphnodes: Supraclavicular and inguinal nodes not enlarged  Abdomen: Soft, non-tender, no ascites, no organomegally, no masses, no hernias , well-healed laparoscopic incisions. Pelvic:  EGBUS: Normal female  Vagina: vaginal cuff in tact, free of cellulitis or blood. Healing well.  Urethra and Bladder: Normal, non-tender  Cervix: surgically absent Uterus: surgically absent Bi-manual examination: Non-tender; no masses Rectal: deferred Lower extremities: No edema or varicosities. Normal range of motion      Allergies  Allergen Reactions  . Codeine Sulfate     REACTION: nausea, pass out  . Neomycin-Bacitracin Zn-Polymyx  REACTION: redness    Past Medical History:  Diagnosis Date  .  Adenocarcinoma of endometrium ([place]) 09/2015  . Allergy    mild seasonal  . Atypical glandular cells of undetermined significance (AGUS) on cervical Pap smear 09/2015  . GERD (gastroesophageal reflux disease)   . Heart murmur    as teenager but not mentioned since  . History of chemotherapy    completed nov 2017  . History of radiation therapy 01/19/16-03/24/16   pelvis 45 Gy in 25 fx, vaginal cuff 18 Gy in 3 fx completed nov 2017  . Hyperlipidemia   . Osteopenia 09/2015   T score -1.4 FRAX 8.5%/0.8%    Past Surgical History:  Procedure Laterality Date  . COLONOSCOPY    . IR GENERIC HISTORICAL  03/02/2016   IR US GUIDE VASC ACCESS RIGHT 03/02/2016 Arne Cleveland, MD [facility]  . IR GENERIC HISTORICAL  03/02/2016   IR FLUORO GUIDE CV LINE RIGHT 03/02/2016 Arne Cleveland, MD [facility]  . LAPAROSCOPY    . POLYPECTOMY    . ROBOTIC ASSISTED TOTAL HYSTERECTOMY WITH BILATERAL SALPINGO OOPHERECTOMY  10/29/15   robotic hysterectomy, BSO and sentinel lymph node biopsy of pelvic and PA nodes at [facility] with Dr Clarene Essex.   . TUBAL LIGATION      Current Outpatient Medications  Medication Sig Dispense Refill  . atorvastatin (LIPITOR) 20 MG tablet Take 1 tablet (20 mg total) by mouth daily. 90 tablet 3  . b complex vitamins capsule Take 1 capsule by mouth daily.    . cephALEXin (KEFLEX) 500 MG capsule Take 1 capsule (500 mg total) by mouth 3 (three) times daily for 7 days. 21 capsule 0  . Multiple Vitamin (MULTIVITAMIN) tablet Take 1 tablet by mouth daily.    . traZODone (DESYREL) 50 MG tablet Take 0.5-1 tablets (25-50 mg total) by mouth at bedtime as needed for sleep. 30 tablet 3   No current facility-administered medications for this visit.     Social History   Socioeconomic History  . Marital status: Married    Spouse name: Not on file  . Number of children: 2  . Years of education: Not on file  . Highest education level: Not on file  Occupational History  . Not on file  Social Needs  .  Financial resource strain: Not on file  . Food insecurity:    Worry: Not on file    Inability: Not on file  . Transportation needs:    Medical: Not on file    Non-medical: Not on file  Tobacco Use  . Smoking status: Current Some Day Smoker    Packs/day: 1.00    Years: 32.00    Pack years: 32.00    Types: Cigarettes  . Smokeless tobacco: Never Used  . Tobacco comment: 3 cigarettes/ day as of 06-30-16  Substance and Sexual Activity  . Alcohol use: No    Alcohol/week: 0.0 oz  . Drug use: No  . Sexual activity: Yes    Comment: 1st intercourse 73 yo-1 partner  Lifestyle  . Physical activity:    Days per week: Not on file    Minutes per session: Not on file  . Stress: Not on file  Relationships  . Social connections:    Talks on phone: Not on file    Gets together: Not on file    Attends religious service: Not on file    Active member of club or organization: Not on file    Attends meetings of clubs or  organizations: Not on file    Relationship status: Not on file  . Intimate partner violence:    Fear of current or ex partner: Not on file    Emotionally abused: Not on file    Physically abused: Not on file    Forced sexual activity: Not on file  Other Topics Concern  . Not on file  Social History Narrative   Married 1970 ([name] is a patient here). 2 sons. 3 grandchildren. 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 Cancer Mother        larynx  . Heart failure Father   . Heart attack Father 29       smoker, war vet  . Melanoma Father   . Heart attack Brother 42  . Colon polyps Brother   . Colon cancer Paternal Grandmother 48  . Colon polyps Sister   . Esophageal cancer Neg Hx   . Rectal cancer Neg Hx   . Stomach  cancer Neg Hx      Thereasa Solo, MD 11/30/2017, 1:52 PM

## 2017-12-04 ENCOUNTER — Telehealth: Payer: Self-pay | Admitting: Family Medicine

## 2017-12-04 ENCOUNTER — Other Ambulatory Visit: Payer: Self-pay

## 2017-12-04 DIAGNOSIS — R319 Hematuria, unspecified: Secondary | ICD-10-CM

## 2017-12-04 DIAGNOSIS — R3129 Other microscopic hematuria: Secondary | ICD-10-CM

## 2017-12-04 NOTE — Telephone Encounter (Signed)
Called patient and faxed over the referral to [facility]. I will call their office to make sure they got the referral in the morning.

## 2017-12-04 NOTE — Telephone Encounter (Signed)
Copied from [place] [phone]. Topic: General - Other >> Dec 04, 2017  2:39 PM Valla Leaver wrote: Reason for CRM: Patient says her [facility] to [facility] was not received. Please fax the referral to [facility], Fax: [phone]. Patient can't be scheduled until they have it. I've scheduled her for urinalysis  to check for blood in the urine per Dr. Silvio Clayman message but I do not see orders. This was to be done after taking 7 days of keflex 500mg . If there is no blood she says she will not need to see urology. Please advise.

## 2017-12-08 DIAGNOSIS — D1801 Hemangioma of skin and subcutaneous tissue: Secondary | ICD-10-CM | POA: Diagnosis not present

## 2017-12-08 DIAGNOSIS — L570 Actinic keratosis: Secondary | ICD-10-CM | POA: Diagnosis not present

## 2017-12-08 DIAGNOSIS — D225 Melanocytic nevi of trunk: Secondary | ICD-10-CM | POA: Diagnosis not present

## 2017-12-08 DIAGNOSIS — L814 Other melanin hyperpigmentation: Secondary | ICD-10-CM | POA: Diagnosis not present

## 2017-12-08 DIAGNOSIS — Z85828 Personal history of other malignant neoplasm of skin: Secondary | ICD-10-CM | POA: Diagnosis not present

## 2017-12-08 DIAGNOSIS — L821 Other seborrheic keratosis: Secondary | ICD-10-CM | POA: Diagnosis not present

## 2017-12-12 ENCOUNTER — Other Ambulatory Visit (INDEPENDENT_AMBULATORY_CARE_PROVIDER_SITE_OTHER): Payer: PPO

## 2017-12-12 DIAGNOSIS — R3129 Other microscopic hematuria: Secondary | ICD-10-CM

## 2017-12-12 LAB — URINALYSIS, MICROSCOPIC ONLY

## 2017-12-12 NOTE — Progress Notes (Signed)
Despite treatment for urinary tract infection she continues to have blood in her urine.   given her smoking history she is higher risk for possible cancer in the bladder.  I cannot locate her prior records from urology- let us refer her back to urology under microscopic hematuria to at least have them give [place] some monitoring parameters for blood in the urine

## 2017-12-14 ENCOUNTER — Other Ambulatory Visit: Payer: Self-pay

## 2017-12-14 DIAGNOSIS — R3129 Other microscopic hematuria: Secondary | ICD-10-CM

## 2017-12-28 DIAGNOSIS — R311 Benign essential microscopic hematuria: Secondary | ICD-10-CM | POA: Diagnosis not present

## 2018-01-03 ENCOUNTER — Encounter: Payer: Self-pay | Admitting: Family Medicine

## 2018-01-03 DIAGNOSIS — R311 Benign essential microscopic hematuria: Secondary | ICD-10-CM | POA: Diagnosis not present

## 2018-01-03 DIAGNOSIS — R3129 Other microscopic hematuria: Secondary | ICD-10-CM | POA: Diagnosis not present

## 2018-01-09 DIAGNOSIS — R3915 Urgency of urination: Secondary | ICD-10-CM | POA: Diagnosis not present

## 2018-01-09 DIAGNOSIS — R311 Benign essential microscopic hematuria: Secondary | ICD-10-CM | POA: Diagnosis not present

## 2018-02-15 ENCOUNTER — Ambulatory Visit
Admission: RE | Admit: 2018-02-15 | Discharge: 2018-02-15 | Disposition: A | Discharge status: Home / Self Care | Payer: PPO | Source: Ambulatory Visit | Attending: Radiation Oncology | Admitting: Radiation Oncology

## 2018-02-15 ENCOUNTER — Other Ambulatory Visit: Payer: Self-pay

## 2018-02-15 ENCOUNTER — Encounter: Payer: Self-pay | Admitting: Radiation Oncology

## 2018-02-15 VITALS — BP 131/80 | HR 89 | Temp 97.9°F | Resp 16 | Ht 63.0 in | Wt 129.0 lb

## 2018-02-15 DIAGNOSIS — C541 Malignant neoplasm of endometrium: Secondary | ICD-10-CM

## 2018-02-15 DIAGNOSIS — Z8542 Personal history of malignant neoplasm of other parts of uterus: Secondary | ICD-10-CM | POA: Diagnosis not present

## 2018-02-15 DIAGNOSIS — N941 Unspecified dyspareunia: Secondary | ICD-10-CM | POA: Diagnosis not present

## 2018-02-15 DIAGNOSIS — Z08 Encounter for follow-up examination after completed treatment for malignant neoplasm: Secondary | ICD-10-CM | POA: Diagnosis not present

## 2018-02-15 NOTE — Progress Notes (Signed)
  Radiation Oncology         [phone] ________________________________  Name: Jacqueline Hammond MRN: [id number]  Date: 02/15/2018  DOB: 05-06-1951  Follow-Up Visit Note  CC: Marin Olp, MD  Dorothyann Gibbs, NP    ICD-10-CM   1. 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 She is accompanied by her husband. She saw Dr. Denman George on 11/30/17. She reports having hematuria during a routine check up, but scans and cystoscopy showed no lesions within the bladder. May be related to radiation cystitis.   She denies any abdominal pain, bloating, nausea/vomiting, diarrhea.  ALLERGIES:  is allergic to codeine sulfate and neomycin-bacitracin zn-polymyx.  Meds: Current Outpatient Medications  Medication Sig Dispense Refill  . atorvastatin (LIPITOR) 20 MG tablet Take 1 tablet (20 mg total) by mouth daily. 90 tablet 3  . b complex vitamins capsule Take 1 capsule by mouth daily.    . Multiple Vitamin (MULTIVITAMIN) tablet Take 1 tablet by mouth daily.    . traZODone (DESYREL) 50 MG tablet Take 0.5-1 tablets (25-50 mg total) by mouth at bedtime as needed for sleep. 30 tablet 3   No current facility-administered medications for this encounter.     Physical Findings: The patient is in no acute distress. Patient is alert and oriented.  height is 5\' 3"  (1.6 m) and weight is 129 lb (58.5 kg). Her oral temperature is 97.9 F (36.6 C). Her blood pressure is 131/80 and her pulse is 89. Her respiration is 16 and oxygen saturation is 98%.  Lungs are clear to auscultation bilaterally. Heart has regular rate and rhythm. No palpable cervical, supraclavicular, or axillary adenopathy. Abdomen soft, non-tender,  normal bowel sounds.  On pelvic examination the external genitalia were unremarkable. A speculum exam was performed. There are no mucosal lesions noted in the vaginal vault.  On bimanual and rectovaginal examination there were no pelvic masses appreciated.  Lab Findings: Lab Results  Component Value Date   WBC 9.2 11/21/2017   HGB 13.8 11/21/2017   HCT 40.7 11/21/2017   MCV 104.1 (H) 11/21/2017   PLT 302.0 11/21/2017    Radiographic Findings: No results found.  Impression: Stage IIIC1 High Grade Serous Carcinoma of Endometrium No evidence of recurrence on clinical exam. Patient does report discomfort/pain with intercourse, but no bleeding. We discussed her restarting use of her vaginal dilators. We will also check with Dr. Denman George to see if she would be a candidate for vaginal estrogen cream.  Plan: Patient will follow up with Dr. Denman George in October, and follow up with radiation oncology in April 2020 (6 month intervals starting at that time)  -----------------------------------  Blair Promise, PhD, MD  This document serves as a record of services personally performed by Gery Pray, MD. It was created on his behalf by Wilburn Mylar, a trained medical scribe. The creation of this record is based on the scribe's personal observations and the provider's statements to them. This document has been checked and approved by the attending provider.

## 2018-02-15 NOTE — Progress Notes (Signed)
Pt presents today for f/u with Dr. Sondra Come. Pt denies c/o pain. Pt reports mild fatigue, intermittently. Pt denies N/V. Pt denies dysuria. Pt denies vaginal bleeding/discharge. Pt denies rectal bleeding. Pt denies abdominal bloating. Pt is accompanied by husband.   BP 131/80 (BP Location: Left Arm, Patient Position: Sitting, Cuff Size: Normal)   Pulse 89   Temp 97.9 F (36.6 C) (Oral)   Resp 16   Ht 5\' 3"  (1.6 m)   Wt 129 lb (58.5 kg)   SpO2 98%   BMI 22.85 kg/m    Wt Readings from Last 3 Encounters:  02/15/18 129 lb (58.5 kg)  11/30/17 129 lb 6.4 oz (58.7 kg)  11/21/17 129 lb 12.8 oz (58.9 kg)   Loma Sousa, RN BSN

## 2018-02-16 DIAGNOSIS — C44629 Squamous cell carcinoma of skin of left upper limb, including shoulder: Secondary | ICD-10-CM | POA: Diagnosis not present

## 2018-02-16 DIAGNOSIS — D0462 Carcinoma in situ of skin of left upper limb, including shoulder: Secondary | ICD-10-CM | POA: Diagnosis not present

## 2018-02-22 DIAGNOSIS — D0462 Carcinoma in situ of skin of left upper limb, including shoulder: Secondary | ICD-10-CM | POA: Diagnosis not present

## 2018-03-05 ENCOUNTER — Encounter: Payer: Self-pay | Admitting: Oncology

## 2018-03-05 ENCOUNTER — Telehealth: Payer: Self-pay | Admitting: Oncology

## 2018-03-05 MED ORDER — ESTROGENS, CONJUGATED 0.625 MG/GM VA CREA: 1.0000 | TOPICAL_CREAM | VAGINAL | 0 refills | Status: DC

## 2018-03-05 NOTE — Telephone Encounter (Signed)
Patient called and said she had discussed Premarin cream with Dr. Sondra Come at her last appointment for pressure/pain with intercourse.  She would like to try it.  Advised her that it will be sent to her pharmacy ([facility]).  Also asked if she would like to try pelvic floor physical therapy and she said she would like to try the Premarin cream first.

## 2018-04-12 DIAGNOSIS — Z85828 Personal history of other malignant neoplasm of skin: Secondary | ICD-10-CM | POA: Diagnosis not present

## 2018-04-12 DIAGNOSIS — L905 Scar conditions and fibrosis of skin: Secondary | ICD-10-CM | POA: Diagnosis not present

## 2018-04-30 ENCOUNTER — Telehealth: Payer: Self-pay | Admitting: *Deleted

## 2018-04-30 NOTE — Telephone Encounter (Signed)
Oncology Nurse Navigator Documentation  Oncology Nurse Navigator Flowsheets 04/30/2018  Navigator Location [facility]  Navigator Encounter Type Telephone/I called to follow up on Jacqueline Hammond's smoking cessation.  She states she has almost quit [facility].  I asked if she would like to have a one on one conversation to go over more tips to help quit completely.  She [facility]  I encouraged her to keep those classes up and to call if needed more assistance.   Telephone Outgoing Call  [employer] Needs Education  Education Smoking cessation  Interventions Education  Education Method Verbal  Acuity Level 1  Time Spent with Patient 15

## 2018-05-01 ENCOUNTER — Telehealth: Payer: Self-pay

## 2018-05-01 NOTE — Telephone Encounter (Signed)
Returned pt's call regarding she needs to cancel her appt with Dr Denman George next Friday and rescheduled for tomorrow (10-2 at 10:15 am. ). Husband answered and said she will call back if rescheduling for tomorrow is not ok.  No other needs per him at this time.

## 2018-05-02 ENCOUNTER — Inpatient Hospital Stay: Payer: PPO | Attending: Gynecologic Oncology | Admitting: Gynecologic Oncology

## 2018-05-02 ENCOUNTER — Encounter: Payer: Self-pay | Admitting: Gynecologic Oncology

## 2018-05-02 VITALS — BP 126/81 | HR 79 | Temp 98.1°F | Resp 16 | Ht 63.0 in | Wt 133.4 lb

## 2018-05-02 DIAGNOSIS — F17210 Nicotine dependence, cigarettes, uncomplicated: Secondary | ICD-10-CM | POA: Diagnosis not present

## 2018-05-02 DIAGNOSIS — Z9221 Personal history of antineoplastic chemotherapy: Secondary | ICD-10-CM | POA: Diagnosis not present

## 2018-05-02 DIAGNOSIS — C541 Malignant neoplasm of endometrium: Secondary | ICD-10-CM | POA: Diagnosis not present

## 2018-05-02 DIAGNOSIS — Z90710 Acquired absence of both cervix and uterus: Secondary | ICD-10-CM | POA: Insufficient documentation

## 2018-05-02 DIAGNOSIS — Z90722 Acquired absence of ovaries, bilateral: Secondary | ICD-10-CM

## 2018-05-02 DIAGNOSIS — Z923 Personal history of irradiation: Secondary | ICD-10-CM | POA: Diagnosis not present

## 2018-05-02 NOTE — Patient Instructions (Signed)
Please notify Dr Denman George at phone number [phone] if you notice vaginal bleeding, new pelvic or abdominal pains, bloating, feeling full easy, or a change in bladder or bowel function.   Please contact Dr Serita Grit office at the above number after October, 2019 to schedule follow-up with her in April 2020.

## 2018-05-02 NOTE — Progress Notes (Signed)
Follow-up Note: Gyn-Onc   Jacqueline Hammond 67 y.o. female  Chief Complaint  Patient presents with  . Endometrial cancer, FIGO stage IIIC (HCC)    Assessment :Stage IIIC1 (with ITC's) serous high grade endometrial adenocarcioma. Cervical stromal involvement. s/p adjuvant therapy (chemotherapy and radiation) completed in September, 2017 with no evidence of recurrence.  Plan   She has no significant persistent toxicities to therapy. Post treatment imaging unremarkable.  I discussed symptoms concerning for maligancy and notified her that she should inform us of these should they develop.   I will see her every 6 months.  The patient was explained that sometimes post-radiation hematuria and frequency can be secondary to radiation cystitis.  HPI: 67 year old white married female gravida 2 seen in consultation request of Dr. Abbie Hammond regarding management of a newly diagnosed endometrial carcinoma. The patient initially had some spotting and a Pap smear revealing atypical glandular cells. She underwent sonohysterogram and endometrial biopsy on 10/19/2015 revealing an endometrial adenocarcinoma with serous features (high-grade) the patient is currently not having any bleeding and has no pelvic symptoms. The uterus measured 7.4 x 3.5 x 3.0 cm with an endometrial stripe of 6.8 mm.  She has no significant past gynecologic history although she had a tubal ligation through a Pfannenstiel incision. Subsequently she underwent diagnostic laparoscopy and lysis of adhesions for pelvic pain which is now resolved. She claims her Pap smears the past and been normal. She has no other significant medical history.  On 10/29/15 she underwent a robotic hysterectomy, BSO and sentinel lymph node biopsy of pelvic and PA nodes at [facility] with Dr Jacqueline Hammond. Surgery was uncomplicated. Postoperative pathology revealed a 33mm tumor of high grade serous adenocarcinoma involving the cervical stroma with no LVSI. There was outer  half myometrial infiltration (12 of 43mm). The left and right pelvic SLN's were positive for ITC's on IHC.  Interval Hx:   She commenced "sandwich" adjuvant therapy with 6 cycles (total) of chemotherapy with carboplatin and paclitaxel completed on 04/21/16, and pelvic external beam radiation (with brachytherapy) completed on 02-23-16.  She did very well during treatment.   Persistent toxicities include mild neuropathy. Diarrhea has resolved.  She reports pain with deep intercourse due to vaginal shortening - is using dilators.    Review of Systems:10 point review of systems is negative except as noted in interval history.   Vitals: Blood pressure 126/81, pulse 79, temperature 98.1 F (36.7 C), temperature source Oral, resp. rate 16, height 5\' 3"  (1.6 m), weight 133 lb 6.4 oz (60.5 kg), SpO2 99 %.  Physical Exam: General : The patient is a healthy woman in no acute distress.  HEENT: normocephalic, extraoccular movements normal; neck is supple without thyromegally  Lynphnodes: Supraclavicular and inguinal nodes not enlarged  Abdomen: Soft, non-tender, no ascites, no organomegally, no masses, no hernias , well-healed laparoscopic incisions. Pelvic:  EGBUS: Normal female  Vagina: vaginal cuff in tact. Shortened but grossly normal and free of disease. Urethra and Bladder: Normal, non-tender Cervix: surgically absent Uterus: surgically absent Bi-manual examination: Non-tender; no masses Rectal: deferred Lower extremities: No edema or varicosities. Normal range of motion      Allergies  Allergen Reactions  . Codeine Sulfate     REACTION: nausea, pass out  . Neomycin-Bacitracin Zn-Polymyx     REACTION: redness    Past Medical History:  Diagnosis Date  . Adenocarcinoma of endometrium (Jacqueline Hammond) 09/2015  . Allergy    mild seasonal  . Atypical glandular cells of undetermined significance (AGUS) on cervical  Pap smear 09/2015  . GERD (gastroesophageal reflux disease)   . Heart murmur     as teenager but not mentioned since  . History of chemotherapy    completed nov 2017  . History of radiation therapy 01/19/16-03/24/16   pelvis 45 Gy in 25 fx, vaginal cuff 18 Gy in 3 fx completed nov 2017  . Hyperlipidemia   . Osteopenia 09/2015   T score -1.4 FRAX 8.5%/0.8%    Past Surgical History:  Procedure Laterality Date  . COLONOSCOPY    . IR GENERIC HISTORICAL  03/02/2016   IR US GUIDE VASC ACCESS RIGHT 03/02/2016 Jacqueline Cleveland, MD [facility]INTER[facility]  . IR GENERIC HISTORICAL  03/02/2016   IR FLUORO GUIDE CV LINE RIGHT 03/02/2016 Jacqueline Cleveland, MD [facility]  . LAPAROSCOPY    . POLYPECTOMY    . ROBOTIC ASSISTED TOTAL HYSTERECTOMY WITH BILATERAL SALPINGO OOPHERECTOMY  10/29/15   robotic hysterectomy, BSO and sentinel lymph node biopsy of pelvic and PA nodes at [facility] with Dr Jacqueline Hammond.   . TUBAL LIGATION      Current Outpatient Medications  Medication Sig Dispense Refill  . atorvastatin (LIPITOR) 20 MG tablet Take 1 tablet (20 mg total) by mouth daily. 90 tablet 3  . b complex vitamins capsule Take 1 capsule by mouth daily.    Marland Kitchen conjugated estrogens (PREMARIN) vaginal cream Place 1 Applicatorful vaginally 3 (three) times a week. 30 g 0  . Multiple Vitamin (MULTIVITAMIN) tablet Take 1 tablet by mouth daily.    . traZODone (DESYREL) 50 MG tablet Take 0.5-1 tablets (25-50 mg total) by mouth at bedtime as needed for sleep. 30 tablet 3   No current facility-administered medications for this visit.     Social History   Socioeconomic History  . Marital status: Married    Spouse name: Not on file  . Number of children: 2  . Years of education: Not on file  . Highest education level: Not on file  Occupational History  . Not on file  Social Needs  . Financial resource strain: Not on file  . Food insecurity:    Worry: Not on file    Inability: Not on file  . Transportation needs:    Medical: Not on file    Non-medical: Not on file  Tobacco Use  . Smoking status: Current Some Day Smoker     Packs/day: 1.00    Years: 32.00    Pack years: 32.00    Types: Cigarettes  . Smokeless tobacco: Never Used  . Tobacco comment: 3 cigarettes/ day as of 06-30-16  Substance and Sexual Activity  . Alcohol use: No    Alcohol/week: 0.0 standard drinks  . Drug use: No  . Sexual activity: Yes    Comment: 1st intercourse 4 yo-1 partner  Lifestyle  . Physical activity:    Days per week: Not on file    Minutes per session: Not on file  . Stress: Not on file  Relationships  . Social connections:    Talks on phone: Not on file    Gets together: Not on file    Attends religious service: Not on file    Active member of club or organization: Not on file    Attends meetings of clubs or organizations: Not on file    Relationship status: Not on file  . Intimate partner violence:    Fear of current or ex partner: Not on file    Emotionally abused: Not on file    Physically abused:  Not on file    Forced sexual activity: Not on file  Other Topics Concern  . Not on file  Social History Narrative   Married 1970 (Jacqueline Hammond is a patient here). 2 Hammond. 3 grandchildren. 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 Cancer Mother        larynx  . Heart failure Father   . Heart attack Father 34       smoker, war vet  . Melanoma Father   . Heart attack Brother 46  . Colon polyps Brother   . Colon cancer Paternal Grandmother 80  . Colon polyps Sister   . Esophageal cancer Neg Hx   . Rectal cancer Neg Hx   . Stomach cancer Neg Hx      Thereasa Solo, MD 05/02/2018, 10:58 AM

## 2018-05-11 ENCOUNTER — Ambulatory Visit: Payer: PPO | Admitting: Gynecologic Oncology

## 2018-06-13 ENCOUNTER — Telehealth: Payer: Self-pay | Admitting: *Deleted

## 2018-06-13 NOTE — Telephone Encounter (Signed)
Returned the patient's call and scheduled a follow up appt for April 

## 2018-07-10 DIAGNOSIS — Z1231 Encounter for screening mammogram for malignant neoplasm of breast: Secondary | ICD-10-CM | POA: Diagnosis not present

## 2018-07-10 LAB — HM MAMMOGRAPHY

## 2018-07-11 ENCOUNTER — Encounter: Payer: Self-pay | Admitting: Family Medicine

## 2018-07-29 ENCOUNTER — Other Ambulatory Visit: Payer: Self-pay | Admitting: Family Medicine

## 2018-07-30 NOTE — Telephone Encounter (Signed)
Since 2 years- please check in with her to see why new request. If just uses sparingly- ok to refill

## 2018-07-31 NOTE — Telephone Encounter (Signed)
Called and left a voicemail message asking patient to either send a My Chart message or return my vall to clarify if she wants this filled

## 2018-08-03 NOTE — Telephone Encounter (Signed)
Patient called back to let Roselyn Reef know that she did request a refill on the  traZODone (DESYREL) 50 MG tablet. She stated that she was still using that which she had but only uses it once in a while but finally ran out. So she did request the refill from the pharmacy. Any questions please call    Ph# [phone]

## 2018-08-03 NOTE — Telephone Encounter (Signed)
Team-you may refill this for patient

## 2018-08-03 NOTE — Telephone Encounter (Signed)
See note

## 2018-10-08 ENCOUNTER — Telehealth: Payer: Self-pay | Admitting: Family Medicine

## 2018-10-08 NOTE — Telephone Encounter (Signed)
Returned call to pt and husband regarding th flu vaccine. Appointment scheduled per request.

## 2018-10-09 ENCOUNTER — Encounter: Payer: Self-pay | Admitting: Family Medicine

## 2018-10-09 ENCOUNTER — Ambulatory Visit: Payer: PPO | Admitting: Family Medicine

## 2018-10-09 ENCOUNTER — Telehealth: Payer: Self-pay | Admitting: *Deleted

## 2018-10-09 ENCOUNTER — Ambulatory Visit (INDEPENDENT_AMBULATORY_CARE_PROVIDER_SITE_OTHER): Payer: PPO | Admitting: *Deleted

## 2018-10-09 DIAGNOSIS — Z23 Encounter for immunization: Secondary | ICD-10-CM | POA: Diagnosis not present

## 2018-10-09 NOTE — Telephone Encounter (Signed)
Patient called back and moved her appt with Dr. Denman George from April to June. She see Dr. Sondra Come in April.

## 2018-10-09 NOTE — Telephone Encounter (Signed)
Attempted to return the patient's call, left a message to call the office back. Patient stated that she needs to reschedule her appt

## 2018-11-15 ENCOUNTER — Ambulatory Visit: Payer: PPO | Admitting: Gynecologic Oncology

## 2018-11-22 ENCOUNTER — Encounter: Payer: Self-pay | Admitting: Radiation Oncology

## 2018-11-22 ENCOUNTER — Ambulatory Visit
Admission: RE | Admit: 2018-11-22 | Discharge: 2018-11-22 | Disposition: A | Discharge status: Home / Self Care | Payer: PPO | Source: Ambulatory Visit | Attending: Radiation Oncology | Admitting: Radiation Oncology

## 2018-11-22 ENCOUNTER — Other Ambulatory Visit: Payer: Self-pay

## 2018-11-22 VITALS — BP 135/91 | HR 98 | Temp 99.0°F | Resp 18 | Ht 63.0 in | Wt 134.2 lb

## 2018-11-22 DIAGNOSIS — Z79899 Other long term (current) drug therapy: Secondary | ICD-10-CM | POA: Insufficient documentation

## 2018-11-22 DIAGNOSIS — C7982 Secondary malignant neoplasm of genital organs: Secondary | ICD-10-CM

## 2018-11-22 DIAGNOSIS — Z8542 Personal history of malignant neoplasm of other parts of uterus: Secondary | ICD-10-CM | POA: Diagnosis not present

## 2018-11-22 DIAGNOSIS — Z923 Personal history of irradiation: Secondary | ICD-10-CM | POA: Diagnosis not present

## 2018-11-22 DIAGNOSIS — Z8544 Personal history of malignant neoplasm of other female genital organs: Secondary | ICD-10-CM | POA: Diagnosis not present

## 2018-11-22 DIAGNOSIS — C775 Secondary and unspecified malignant neoplasm of intrapelvic lymph nodes: Secondary | ICD-10-CM

## 2018-11-22 DIAGNOSIS — C541 Malignant neoplasm of endometrium: Secondary | ICD-10-CM

## 2018-11-22 DIAGNOSIS — Z8579 Personal history of other malignant neoplasms of lymphoid, hematopoietic and related tissues: Secondary | ICD-10-CM | POA: Diagnosis not present

## 2018-11-22 DIAGNOSIS — Z08 Encounter for follow-up examination after completed treatment for malignant neoplasm: Secondary | ICD-10-CM | POA: Diagnosis not present

## 2018-11-22 NOTE — Progress Notes (Signed)
Pt presents today for f/u with Dr. Sondra Come. Pt denies c/o pain. Pt denies dysuria/hematuria. Pt denies vaginal bleeding/discharge. Pt denies rectal bleeding, diarrhea/constipation. Pt denies abdominal bloating, N/V.  BP (!) 135/91 (BP Location: Left Arm, Patient Position: Sitting)   Pulse 98   Temp 99 F (37.2 C) (Oral)   Resp 18   Ht 5\' 3"  (1.6 m)   Wt 134 lb 4 oz (60.9 kg)   SpO2 99%   BMI 23.78 kg/m   Wt Readings from Last 3 Encounters:  11/22/18 134 lb 4 oz (60.9 kg)  05/02/18 133 lb 6.4 oz (60.5 kg)  02/15/18 129 lb (58.5 kg)   Loma Sousa, RN BSN

## 2018-11-22 NOTE — Progress Notes (Signed)
Radiation Oncology         [phone] ________________________________  Name: Jacqueline Hammond MRN: [id number]  Date: 11/22/2018  DOB: 05/02/1951  Follow-Up Visit Note  CC: Marin Olp, MD  Dorothyann Gibbs, NP    ICD-10-CM   1. Endometrial cancer, FIGO stage IIIC (HCC) C54.1   2. Secondary malignant neoplasm of cervix ([facility]) C79.82   3. 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  She last saw Dr. Denman George in October 2019 with no evidence of disease.   On review of systems, the patient denies any pain. She denies dysuria or hematuria. She denies vaginal bleeding or discharge. She denies rectal bleeding, diarrhea, or constipation. She denies abdominal bloating, nausea, or vomiting.                        ALLERGIES:  is allergic to codeine sulfate and neomycin-bacitracin zn-polymyx.  Meds: Current Outpatient Medications  Medication Sig Dispense Refill  . Ascorbic Acid (VITAMIN C) 100 MG tablet Take 100 mg by mouth daily.    Marland Kitchen atorvastatin (LIPITOR) 20 MG tablet Take 1 tablet (20 mg total) by mouth daily. 90 tablet 3  . b complex vitamins capsule Take 1 capsule by mouth daily.    Marland Kitchen conjugated estrogens (PREMARIN) vaginal cream Place 1 Applicatorful vaginally 3 (three) times a week. 30 g 0  . Multiple Vitamin (MULTIVITAMIN) tablet Take 1 tablet by mouth daily.    . traZODone (DESYREL) 50 MG tablet TAKE 1/2 TO 1 TABLET(25 TO 50 MG) BY MOUTH AT BEDTIME AS NEEDED FOR SLEEP 30 tablet 3  . zinc gluconate 50 MG tablet Take 50  mg by mouth daily.     No current facility-administered medications for this encounter.     Physical Findings: The patient is in no acute distress. Patient is alert and oriented.  height is 5\' 3"  (1.6 m) and weight is 134 lb 4 oz (60.9 kg). Her oral temperature is 99 F (37.2 C). Her blood pressure is 135/91 (abnormal) and her pulse is 98. Her respiration is 18 and oxygen saturation is 99%.   Lungs are clear to auscultation bilaterally. Heart has regular rate and rhythm. No palpable cervical, supraclavicular, or axillary adenopathy. Abdomen soft, non-tender, normal bowel sounds.  On pelvic examination the external genitalia were unremarkable. A speculum exam was performed. There are no mucosal lesions noted in the vaginal vault.  On bimanual  examination there were no pelvic masses appreciated.  Lab Findings: Lab Results  Component Value Date   WBC 9.2 11/21/2017   HGB 13.8 11/21/2017   HCT 40.7 11/21/2017   MCV 104.1 (H) 11/21/2017   PLT 302.0  11/21/2017    Radiographic Findings: No results found.  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 No evidence of recurrence on clinical exam.  Plan:  Patient will follow up with Dr. Denman George in 6 months. Follow up in radiation oncology in 1 year.  ____________________________________  Blair Promise, PhD, MD  This document serves as a record of services personally performed by Gery Pray, MD. It was created on his behalf by Rae Lips, a trained medical scribe. The creation of this record is based on the scribe's personal observations and the provider's statements to them. This document has been checked and approved by the attending provider.

## 2018-11-22 NOTE — Patient Instructions (Signed)
Coronavirus (COVID-19) Are you at risk?  Are you at risk for the Coronavirus (COVID-19)?  To be considered HIGH RISK for Coronavirus (COVID-19), you have to meet the following criteria:  . Traveled to [place], [place], [place], [place] or [place]; or in the [place] to [place], [place], [place], or [place]; and have fever, cough, and shortness of breath within the last 2 weeks of travel OR . Been in close contact with a person diagnosed with COVID-19 within the last 2 weeks and have fever, cough, and shortness of breath . IF YOU DO NOT MEET THESE CRITERIA, YOU ARE CONSIDERED LOW RISK FOR COVID-19.  What to do if you are HIGH RISK for COVID-19?  . If you are having a medical emergency, call 911. . Seek medical care right away. Before you go to a doctor's office, urgent care or emergency department, call ahead and tell them about your recent travel, contact with someone diagnosed with COVID-19, and your symptoms. You should receive instructions from your physician's office regarding next steps of care.  . When you arrive at healthcare provider, tell the healthcare staff immediately you have returned from visiting [place], [place], [place], [place] or [place]; or traveled in the [place] to [place], [place], [place], or [place]; in the last two weeks or you have been in close contact with a person diagnosed with COVID-19 in the last 2 weeks.   . Tell the health care staff about your symptoms: fever, cough and shortness of breath. . After you have been seen by a medical provider, you will be either: o Tested for (COVID-19) and discharged home on quarantine except to seek medical care if symptoms worsen, and asked to  - Stay home and avoid contact with others until you get your results (4-5 days)  - Avoid travel on public transportation if possible (such as bus, train, or airplane) or o Sent to the Emergency Department by EMS for evaluation, COVID-19 testing, and possible  admission depending on your condition and test results.  What to do if you are LOW RISK for COVID-19?  Reduce your risk of any infection by using the same precautions used for avoiding the common cold or flu:  . Wash your hands often with soap and warm water for at least 20 seconds.  If soap and water are not readily available, use an alcohol-based hand sanitizer with at least 60% alcohol.  . If coughing or sneezing, cover your mouth and nose by coughing or sneezing into the elbow areas of your shirt or coat, into a tissue or into your sleeve (not your hands). . Avoid shaking hands with others and consider head nods or verbal greetings only. . Avoid touching your eyes, nose, or mouth with unwashed hands.  . Avoid close contact with people who are sick. . Avoid places or events with large numbers of people in one location, like concerts or sporting events. . Carefully consider travel plans you have or are making. . If you are planning any travel outside or inside the [place], visit the CDC's Travelers' Health webpage for the latest health notices. . If you have some symptoms but not all symptoms, continue to monitor at home and seek medical attention if your symptoms worsen. . If you are having a medical emergency, call 911.   ADDITIONAL HEALTHCARE OPTIONS FOR PATIENTS  [facility] / e-Visit: [email or web address]         [facility]: [phone]  [facility]: [phone]                   [facility]: [phone]   

## 2018-12-28 ENCOUNTER — Telehealth: Payer: Self-pay | Admitting: Family Medicine

## 2018-12-28 NOTE — Telephone Encounter (Signed)
Patient scheduled.

## 2018-12-28 NOTE — Telephone Encounter (Signed)
Please advise on pneumonia shot. We have not been advised that we are scheduling flu shots yet due to [place].   Copied from [place] [id number]. Topic: Appointment Scheduling - Scheduling Inquiry for Clinic >> Dec 28, 2018 12:23 PM Margot Ables wrote: Reason for CRM: Pt called and states she is past due for second pneumonia shot. Please call if ok to schedule

## 2018-12-31 ENCOUNTER — Ambulatory Visit (INDEPENDENT_AMBULATORY_CARE_PROVIDER_SITE_OTHER): Payer: PPO | Admitting: Family Medicine

## 2018-12-31 DIAGNOSIS — Z23 Encounter for immunization: Secondary | ICD-10-CM | POA: Diagnosis not present

## 2018-12-31 NOTE — Progress Notes (Signed)
Per orders of Dr. Yong Channel, injection of Pneumococcal 23 given by Brendell L Tyus in left deltoid. Patient tolerated injection well.   I have reviewed and agree with note, evaluation, plan.   [name], MD

## 2018-12-31 NOTE — Patient Instructions (Signed)
Health Maintenance Due  Topic Date Due  . PNA vac Low Risk Adult (2 of 2 - PPSV23) 11/08/2018    Depression screen [facility] 2/9 11/07/2017 08/17/2017 02/09/2017  Decreased Interest 0 0 0  Down, Depressed, Hopeless 0 0 0  PHQ - 2 Score 0 0 0

## 2019-01-21 DIAGNOSIS — Z85828 Personal history of other malignant neoplasm of skin: Secondary | ICD-10-CM | POA: Diagnosis not present

## 2019-01-21 DIAGNOSIS — L821 Other seborrheic keratosis: Secondary | ICD-10-CM | POA: Diagnosis not present

## 2019-01-21 DIAGNOSIS — L308 Other specified dermatitis: Secondary | ICD-10-CM | POA: Diagnosis not present

## 2019-01-21 DIAGNOSIS — L814 Other melanin hyperpigmentation: Secondary | ICD-10-CM | POA: Diagnosis not present

## 2019-01-21 DIAGNOSIS — D225 Melanocytic nevi of trunk: Secondary | ICD-10-CM | POA: Diagnosis not present

## 2019-01-21 DIAGNOSIS — D1801 Hemangioma of skin and subcutaneous tissue: Secondary | ICD-10-CM | POA: Diagnosis not present

## 2019-01-25 ENCOUNTER — Ambulatory Visit: Payer: PPO | Admitting: Gynecologic Oncology

## 2019-01-29 ENCOUNTER — Ambulatory Visit: Payer: Self-pay

## 2019-01-29 NOTE — Telephone Encounter (Signed)
Patient called and says she and her husband went to [facility] on June 11-15th and asked if they could have the covid test done. She says she saw on the news to have a test done for high risk conditions. She says he has high risk conditions and her husband does to. She says her daughter has MS and she lives with them. She says she or her husband are not having symptoms. She asked about having an antibody test, to see if they have it. I advised I will send these questions to Dr. Yong Channel and someone will call with his recommendation, she verbalized understanding.

## 2019-01-30 ENCOUNTER — Telehealth: Payer: Self-pay | Admitting: Physician Assistant

## 2019-01-30 ENCOUNTER — Encounter: Payer: Self-pay | Admitting: Physician Assistant

## 2019-01-30 ENCOUNTER — Other Ambulatory Visit: Payer: PPO

## 2019-01-30 ENCOUNTER — Ambulatory Visit (INDEPENDENT_AMBULATORY_CARE_PROVIDER_SITE_OTHER): Payer: PPO | Admitting: Physician Assistant

## 2019-01-30 DIAGNOSIS — R6889 Other general symptoms and signs: Secondary | ICD-10-CM | POA: Diagnosis not present

## 2019-01-30 DIAGNOSIS — Z20828 Contact with and (suspected) exposure to other viral communicable diseases: Secondary | ICD-10-CM | POA: Diagnosis not present

## 2019-01-30 DIAGNOSIS — Z7189 Other specified counseling: Secondary | ICD-10-CM | POA: Diagnosis not present

## 2019-01-30 DIAGNOSIS — Z20822 Contact with and (suspected) exposure to covid-19: Secondary | ICD-10-CM

## 2019-01-30 NOTE — Telephone Encounter (Signed)
Please call and schedule patient for COVID-19 testing.  Inda Coke PA-C

## 2019-01-30 NOTE — Progress Notes (Signed)
TELEPHONE ENCOUNTER   Patient verbally agreed to telephone visit and is aware that copayment and coinsurance may apply. Patient was treated using telemedicine according to accepted telemedicine protocols.  Location of the patient: home Location of provider: [place] Names of all persons participating in the telemedicine service and role in the encounter: Inda Coke, [place], Ferdie Ping  Subjective:   Chief Complaint  Patient presents with  . COVID-19 testing     HPI   Patient was with husband at [facility] two weeks ago. She is hoping for COVID-19 testing as this is a "hot spot" of COVID-19, which she learned after the fact of going there.  She denies: fever, cough, chills, SOB, unusual loss of taste/smell, sore throat  Patient Active Problem List   Diagnosis Date Noted  . Chemotherapy-induced peripheral neuropathy ([place]) 04/09/2016  . Restless legs 12/18/2015  . Constipation 11/28/2015  . Continuous tobacco abuse 11/13/2015  . Macrocytosis without anemia 11/13/2015  . Osteopenia determined by x-ray 11/13/2015  . Ectatic abdominal aorta ([place]) 11/13/2015  . Hx of adenomatous colonic polyps 11/13/2015  . Endometrial cancer, FIGO stage IIIC ([place]) 11/09/2015  . Secondary malignant neoplasm of cervix ([place]) 11/09/2015  . 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 Family history of melanoma 10/10/2014  . Former smoker 10/10/2014  . Insomnia 10/10/2014  . Hematuria 12/04/2012  . Hyperlipidemia 05/08/2008   Social History   Tobacco Use  . Smoking status: Current Some Day Smoker    Packs/day: 1.00    Years: 32.00    Pack years: 32.00    Types: Cigarettes  . Smokeless tobacco: Never Used  . Tobacco comment: 3 cigarettes/ day as of 06-30-16  Substance Use Topics  . Alcohol use: No    Alcohol/week: 0.0 standard drinks    Current Outpatient Medications:  .  Ascorbic Acid (VITAMIN C) 100 MG tablet, Take 100 mg by mouth daily.,  Disp: , Rfl:  .  atorvastatin (LIPITOR) 20 MG tablet, Take 1 tablet (20 mg total) by mouth daily., Disp: 90 tablet, Rfl: 3 .  b complex vitamins capsule, Take 1 capsule by mouth daily., Disp: , Rfl:  .  Multiple Vitamin (MULTIVITAMIN) tablet, Take 1 tablet by mouth daily., Disp: , Rfl:  .  traZODone (DESYREL) 50 MG tablet, TAKE 1/2 TO 1 TABLET(25 TO 50 MG) BY MOUTH AT BEDTIME AS NEEDED FOR SLEEP, Disp: 30 tablet, Rfl: 3 .  zinc gluconate 50 MG tablet, Take 50 mg by mouth daily., Disp: , Rfl:  Allergies  Allergen Reactions  . Codeine Sulfate     REACTION: nausea, pass out  . Neomycin-Bacitracin Zn-Polymyx     REACTION: redness    Assessment & Plan:   1. Advice Given About Covid-19 Virus Infection   2. Exposure to Covid-19 Virus    No red flags on discussion. COVID-19 testing to be arranged by [facility]. We also discussed antibody testing and we will consider that at later date.   I did review with patient that if symptoms develop, to seek medical attention and if severe, seek ER treatment.  No orders of the defined types were placed in this encounter.  No orders of the defined types were placed in this encounter.   Inda Coke, [place] 01/30/2019  Time spent with the patient: 6 minutes, spent in obtaining information about her symptoms, reviewing her previous labs, evaluations, and treatments, counseling her about her condition (please see the discussed topics above), and developing a plan  to further investigate it; she had a number of questions which I addressed.

## 2019-01-30 NOTE — Telephone Encounter (Signed)
LVM ABOUT SCHEDULING A DOXY.ME APPT.

## 2019-01-30 NOTE — Addendum Note (Signed)
Addended by: Denyce Robert on: 01/30/2019 10:16 AM   Modules accepted: Orders

## 2019-01-30 NOTE — Telephone Encounter (Signed)
Scheduled patient for COVID 19 test today at 3:30 pm at [facility].  Testing protocol reviewed.

## 2019-01-30 NOTE — Telephone Encounter (Signed)
Front Office, please schedule virtual appointment for the patient to discuss testing.  Clinical team be advised.

## 2019-01-30 NOTE — Telephone Encounter (Signed)
Noted  

## 2019-02-04 LAB — NOVEL CORONAVIRUS, NAA: SARS-CoV-2, NAA: NOT DETECTED

## 2019-03-13 ENCOUNTER — Telehealth: Payer: Self-pay | Admitting: *Deleted

## 2019-03-13 NOTE — Telephone Encounter (Signed)
Received a message from [place] in radiation to move the appt from September to October.

## 2019-03-14 ENCOUNTER — Telehealth: Payer: Self-pay | Admitting: *Deleted

## 2019-03-14 NOTE — Telephone Encounter (Signed)
Called patient to inform of appt. with Dr. Denman George on 05-06-19 - arrival time - 12:45 pm, spoke with patient and she is aware of this appt.

## 2019-04-26 ENCOUNTER — Ambulatory Visit: Payer: PPO | Admitting: Gynecologic Oncology

## 2019-05-06 ENCOUNTER — Other Ambulatory Visit: Payer: Self-pay

## 2019-05-06 ENCOUNTER — Encounter: Payer: Self-pay | Admitting: Gynecologic Oncology

## 2019-05-06 ENCOUNTER — Inpatient Hospital Stay: Payer: PPO | Attending: Gynecologic Oncology | Admitting: Gynecologic Oncology

## 2019-05-06 VITALS — BP 121/77 | HR 90 | Temp 98.3°F | Resp 18 | Ht 63.0 in | Wt 131.4 lb

## 2019-05-06 DIAGNOSIS — C541 Malignant neoplasm of endometrium: Secondary | ICD-10-CM | POA: Diagnosis not present

## 2019-05-06 DIAGNOSIS — Z923 Personal history of irradiation: Secondary | ICD-10-CM | POA: Diagnosis not present

## 2019-05-06 DIAGNOSIS — Z90710 Acquired absence of both cervix and uterus: Secondary | ICD-10-CM | POA: Insufficient documentation

## 2019-05-06 DIAGNOSIS — C7982 Secondary malignant neoplasm of genital organs: Secondary | ICD-10-CM | POA: Diagnosis not present

## 2019-05-06 DIAGNOSIS — Z9221 Personal history of antineoplastic chemotherapy: Secondary | ICD-10-CM

## 2019-05-06 DIAGNOSIS — F17210 Nicotine dependence, cigarettes, uncomplicated: Secondary | ICD-10-CM | POA: Insufficient documentation

## 2019-05-06 DIAGNOSIS — C775 Secondary and unspecified malignant neoplasm of intrapelvic lymph nodes: Secondary | ICD-10-CM | POA: Insufficient documentation

## 2019-05-06 DIAGNOSIS — Z90722 Acquired absence of ovaries, bilateral: Secondary | ICD-10-CM | POA: Insufficient documentation

## 2019-05-06 NOTE — Patient Instructions (Signed)
Please notify Dr Denman George at phone number [phone] if you notice vaginal bleeding, new pelvic or abdominal pains, bloating, feeling full easy, or a change in bladder or bowel function.   Please contact Dr Serita Grit office (at [phone]) in January, 2021 to request an appointment with her for April, 2021.

## 2019-05-06 NOTE — Progress Notes (Signed)
Follow-up Note: Gyn-Onc   Lillette Boxer 68 y.o. female  Chief Complaint  Patient presents with  . Secondary malignant neoplasm of cervix ([place])  . 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 Cervical stromal involvement. s/p adjuvant therapy (chemotherapy and radiation) completed in September, 2017 with no evidence of recurrence.  Plan   She has no significant persistent toxicities to therapy. Post treatment imaging unremarkable.  I discussed symptoms concerning for maligancy and notified her that she should inform us of these should they develop.   I will see her every 6 months.  The patient was explained that sometimes post-radiation hematuria and frequency can be secondary to radiation cystitis.  HPI: 68 year old white married female gravida 2 seen in consultation request of Dr. Abbie Sons regarding management of a newly diagnosed endometrial carcinoma. The patient initially had some spotting and a Pap smear revealing atypical glandular cells. She underwent sonohysterogram and endometrial biopsy on 10/19/2015 revealing an endometrial adenocarcinoma with serous features (high-grade) the patient is currently not having any bleeding and has no pelvic symptoms. The uterus measured 7.4 x 3.5 x 3.0 cm with an endometrial stripe of 6.8 mm.  She has no significant past gynecologic history although she had a tubal ligation through a Pfannenstiel incision. Subsequently she underwent diagnostic laparoscopy and lysis of adhesions for pelvic pain which is now resolved. She claims her Pap smears the past and been normal. She has no other significant medical history.  On 10/29/15 she underwent a robotic hysterectomy, BSO and sentinel lymph node biopsy of pelvic and PA nodes at [facility] with Dr Clarene Essex. Surgery was uncomplicated. Postoperative pathology revealed a 44mm tumor of high grade serous adenocarcinoma  involving the cervical stroma with no LVSI. There was outer half myometrial infiltration (12 of 3mm). The left and right pelvic SLN's were positive for ITC's on IHC.  Interval Hx:   She commenced "sandwich" adjuvant therapy with 6 cycles (total) of chemotherapy with carboplatin and paclitaxel completed on 04/21/16, and pelvic external beam radiation (with brachytherapy) completed on 02-23-16.  She did very well during treatment.   Persistent toxicities include mild neuropathy.  Her son is in the ICU with esophageal cancer.    Review of Systems:10 point review of systems is negative except as noted in interval history.   Vitals: Blood pressure 121/77, pulse 90, temperature 98.3 F (36.8 C), temperature source Oral, resp. rate 18, height 5\' 3"  (1.6 m), weight 131 lb 6.4 oz (59.6 kg), SpO2 100 %.  Physical Exam: General : The patient is a healthy woman in no acute distress.  HEENT: normocephalic, extraoccular movements normal; neck is supple without thyromegally  Lynphnodes: Supraclavicular and inguinal nodes not enlarged  Abdomen: Soft, non-tender, no ascites, no organomegally, no masses, no hernias , well-healed laparoscopic incisions. Pelvic:  EGBUS: Normal female  Vagina: vaginal cuff in tact. Shortened but grossly normal and free of disease. Urethra and Bladder: Normal, non-tender Cervix: surgically absent Uterus: surgically absent Bi-manual examination: Non-tender; no masses Rectal: deferred Lower extremities: No edema or varicosities. Normal range of motion      Allergies  Allergen Reactions  . Codeine Sulfate     REACTION: nausea, pass out  . Neomycin-Bacitracin Zn-Polymyx     REACTION: redness    Past Medical History:  Diagnosis Date  . Adenocarcinoma of endometrium ([place]) 09/2015  . Allergy    mild seasonal  . Atypical glandular cells of undetermined significance (AGUS)  on cervical Pap smear 09/2015  . GERD (gastroesophageal reflux disease)   . Heart murmur     as teenager but not mentioned since  . History of chemotherapy    completed nov 2017  . History of radiation therapy 01/19/16-03/24/16   pelvis 45 Gy in 25 fx, vaginal cuff 18 Gy in 3 fx completed nov 2017  . Hyperlipidemia   . Osteopenia 09/2015   T score -1.4 FRAX 8.5%/0.8%    Past Surgical History:  Procedure Laterality Date  . COLONOSCOPY    . IR GENERIC HISTORICAL  03/02/2016   IR US GUIDE VASC ACCESS RIGHT 03/02/2016 Arne Cleveland, MD [facility]INTER[facility] RAD  . IR GENERIC HISTORICAL  03/02/2016   IR FLUORO GUIDE CV LINE RIGHT 03/02/2016 Arne Cleveland, MD [facility]INTER[facility]  . LAPAROSCOPY    . POLYPECTOMY    . ROBOTIC ASSISTED TOTAL HYSTERECTOMY WITH BILATERAL SALPINGO OOPHERECTOMY  10/29/15   robotic hysterectomy, BSO and sentinel lymph node biopsy of pelvic and PA nodes at [facility] with Dr Clarene Essex.   . TUBAL LIGATION      Current Outpatient Medications  Medication Sig Dispense Refill  . Ascorbic Acid (VITAMIN C) 100 MG tablet Take 100 mg by mouth daily.    Marland Kitchen atorvastatin (LIPITOR) 20 MG tablet Take 1 tablet (20 mg total) by mouth daily. 90 tablet 3  . b complex vitamins capsule Take 1 capsule by mouth daily.    . Multiple Vitamin (MULTIVITAMIN) tablet Take 1 tablet by mouth daily.    . traZODone (DESYREL) 50 MG tablet TAKE 1/2 TO 1 TABLET(25 TO 50 MG) BY MOUTH AT BEDTIME AS NEEDED FOR SLEEP 30 tablet 3  . zinc gluconate 50 MG tablet Take 50 mg by mouth daily.     No current facility-administered medications for this visit.     Social History   Socioeconomic History  . Marital status: Married    Spouse name: Not on file  . Number of children: 2  . Years of education: Not on file  . Highest education level: Not on file  Occupational History  . Not on file  Social Needs  . Financial resource strain: Not on file  . Food insecurity    Worry: Not on file    Inability: Not on file  . Transportation needs    Medical: Not on file    Non-medical: Not on file  Tobacco Use  . Smoking status:  Current Some Day Smoker    Packs/day: 1.00    Years: 32.00    Pack years: 32.00    Types: Cigarettes  . Smokeless tobacco: Never Used  . Tobacco comment: 3 cigarettes/ day as of 06-30-16  Substance and Sexual Activity  . Alcohol use: No    Alcohol/week: 0.0 standard drinks  . Drug use: No  . Sexual activity: Yes    Comment: 1st intercourse 33 yo-1 partner  Lifestyle  . Physical activity    Days per week: Not on file    Minutes per session: Not on file  . Stress: Not on file  Relationships  . Social Herbalist on phone: Not on file    Gets together: Not on file    Attends religious service: Not on file    Active member of club or organization: Not on file    Attends meetings of clubs or organizations: Not on file    Relationship status: Not on file  . Intimate partner violence    Fear of current or ex  partner: Not on file    Emotionally abused: Not on file    Physically abused: Not on file    Forced sexual activity: Not on file  Other Topics Concern  . Not on file  Social History Narrative   Married 1970 ([name] is a patient here). 2 sons. 3 grandchildren. 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 Cancer Mother        larynx  . Heart failure Father   . Heart attack Father 73       smoker, war vet  . Melanoma Father   . Heart attack Brother 60  . Colon polyps Brother   . Colon cancer Paternal Grandmother 21  . Colon polyps Sister   . Esophageal cancer Neg Hx   . Rectal cancer Neg Hx   . Stomach cancer Neg Hx      Thereasa Solo, MD 05/06/2019, 1:44 PM

## 2019-05-08 ENCOUNTER — Encounter: Payer: Self-pay | Admitting: Gynecology

## 2019-05-21 ENCOUNTER — Telehealth: Payer: Self-pay | Admitting: Family Medicine

## 2019-05-21 NOTE — Telephone Encounter (Signed)
[place], can you take the patient off of the call list for [facility] to schedule AWV? I am not sure if this is possible

## 2019-05-21 NOTE — Telephone Encounter (Signed)
Could you note somewhere for [place] and AWV to not call this patient by accident to schedule??

## 2019-05-21 NOTE — Telephone Encounter (Signed)
I left a message asking the patient to call and schedule Medicare AWV with Courtney ([facility]).  If patient calls back, please schedule Medicare Wellness Visit (initial) at next available opening.  VDM (Dee-Dee) 

## 2019-05-21 NOTE — Telephone Encounter (Signed)
See note

## 2019-05-21 NOTE — Telephone Encounter (Signed)
Patient states her son is currently in palliative care and states that she is not ready to schedule AWV. She requests that the office stop calling her to schedule as she has a lot on her plate at this time. Patient will call office when she is ready to schedule.

## 2019-05-22 NOTE — Telephone Encounter (Signed)
Our C3 team has a report that we work from, and I can make a note so that we don't call her or her husband.  I'm not sure if there's a way we can note/flag this in Epic also.

## 2019-08-06 DIAGNOSIS — Z1231 Encounter for screening mammogram for malignant neoplasm of breast: Secondary | ICD-10-CM | POA: Diagnosis not present

## 2019-08-08 ENCOUNTER — Encounter: Payer: Self-pay | Admitting: *Deleted

## 2019-09-09 ENCOUNTER — Ambulatory Visit: Payer: PPO | Attending: Internal Medicine

## 2019-09-09 DIAGNOSIS — Z23 Encounter for immunization: Secondary | ICD-10-CM | POA: Insufficient documentation

## 2019-09-09 NOTE — Progress Notes (Signed)
   Covid-19 Vaccination Clinic  Name:  Jacqueline Hammond    MRN: [id number] DOB: 10/23/1950  09/09/2019  Jacqueline Hammond was observed post Covid-19 immunization for 15 minutes without incidence. She was provided with Vaccine Information Sheet and instruction to access the V-Safe system.   Jacqueline Hammond was instructed to call 911 with any severe reactions post vaccine: Marland Kitchen Difficulty breathing  . Swelling of your face and throat  . A fast heartbeat  . A bad rash all over your body  . Dizziness and weakness    Immunizations Administered    Name Date Dose VIS Date Route   Pfizer COVID-19 Vaccine 09/09/2019  5:58 PM 0.3 mL 07/12/2019 Intramuscular   Manufacturer: [place]   Lot: [id number]   [place]: [id number]

## 2019-10-04 ENCOUNTER — Ambulatory Visit: Payer: PPO | Attending: Internal Medicine

## 2019-10-04 DIAGNOSIS — Z23 Encounter for immunization: Secondary | ICD-10-CM

## 2019-10-04 NOTE — Progress Notes (Signed)
   Covid-19 Vaccination Clinic  Name:  Jacqueline Hammond    MRN: [id number] DOB: Aug 19, 1950  10/04/2019  [name] was observed post Covid-19 immunization for 15 minutes without incident. She was provided with Vaccine Information Sheet and instruction to access the V-Safe system.   [name] was instructed to call 911 with any severe reactions post vaccine: Marland Kitchen Difficulty breathing  . Swelling of face and throat  . A fast heartbeat  . A bad rash all over body  . Dizziness and weakness   Immunizations Administered    Name Date Dose VIS Date Route   Pfizer COVID-19 Vaccine 10/04/2019  5:00 AM 0.3 mL 07/12/2019 Intramuscular   Manufacturer: [place]   Lot: [id number]   [place]: [id number]

## 2019-10-23 ENCOUNTER — Other Ambulatory Visit: Payer: Self-pay

## 2019-10-23 ENCOUNTER — Ambulatory Visit (INDEPENDENT_AMBULATORY_CARE_PROVIDER_SITE_OTHER): Payer: PPO

## 2019-10-23 DIAGNOSIS — Z23 Encounter for immunization: Secondary | ICD-10-CM | POA: Diagnosis not present

## 2019-11-25 ENCOUNTER — Other Ambulatory Visit: Payer: Self-pay

## 2019-11-25 ENCOUNTER — Ambulatory Visit
Admission: RE | Admit: 2019-11-25 | Discharge: 2019-11-25 | Disposition: A | Discharge status: Home / Self Care | Payer: PPO | Source: Ambulatory Visit | Attending: Radiation Oncology | Admitting: Radiation Oncology

## 2019-11-25 ENCOUNTER — Encounter: Payer: Self-pay | Admitting: Radiation Oncology

## 2019-11-25 VITALS — BP 135/77 | HR 82 | Temp 99.1°F | Resp 18 | Ht 63.0 in | Wt 133.4 lb

## 2019-11-25 DIAGNOSIS — Z923 Personal history of irradiation: Secondary | ICD-10-CM | POA: Diagnosis not present

## 2019-11-25 DIAGNOSIS — Z79899 Other long term (current) drug therapy: Secondary | ICD-10-CM | POA: Insufficient documentation

## 2019-11-25 DIAGNOSIS — Z08 Encounter for follow-up examination after completed treatment for malignant neoplasm: Secondary | ICD-10-CM | POA: Diagnosis not present

## 2019-11-25 DIAGNOSIS — C7982 Secondary malignant neoplasm of genital organs: Secondary | ICD-10-CM

## 2019-11-25 DIAGNOSIS — Z8542 Personal history of malignant neoplasm of other parts of uterus: Secondary | ICD-10-CM | POA: Insufficient documentation

## 2019-11-25 DIAGNOSIS — C541 Malignant neoplasm of endometrium: Secondary | ICD-10-CM

## 2019-11-25 NOTE — Progress Notes (Signed)
Radiation Oncology         [phone] ________________________________  Name: Jacqueline Hammond MRN: [id number]  Date: 11/25/2019  DOB: 1950/10/16  Follow-Up Visit Note  CC: Marin Olp, MD  Dorothyann Gibbs, NP    ICD-10-CM   1. Endometrial cancer, FIGO stage IIIC (HCC)  C54.1   2. Secondary malignant neoplasm of cervix (HCC)  C79.82     Diagnosis: Stage IIIC1 high grade serous carcinoma of endometrium  Interval Since Last Radiation: Three years, eight months, and two days.  Radiation treatment dates: 1) 01/19/2016-02/23/2016                                                 2) 03/08/2016, 03/17/2016, 03/24/2016 Site/dose:   1) Pelvis / 45 Gy in 25 fractions                      2) Vaginal cuff / 18 Gy in 3 fractions  Narrative:  The patient returns today for routine follow-up. She was last seen by Dr. Denman George on 05/06/2019, during which time there was no evidence of recurrence.  The patient underwent a bilateral screening mammogram on 08/06/2019 at [facility]. There was no mammographic evidence of malignancy.  Of note, the patient received her COVID-19 vaccinations on 09/09/2019 and 10/04/2019.  On review of systems, she reports no complaints. She denies pain, bowel/bladder issues, rectal bleeding, vaginal bleeding/discharge, and vaginal odor or itching.  ALLERGIES:  is allergic to codeine sulfate and neomycin-bacitracin zn-polymyx.  Meds: Current Outpatient Medications  Medication Sig Dispense Refill  . Ascorbic Acid (VITAMIN C) 100 MG tablet Take 100 mg by mouth daily.    Marland Kitchen b complex vitamins capsule Take 1 capsule by mouth daily.    . Multiple Vitamin (MULTIVITAMIN) tablet Take 1 tablet by mouth daily.    . traZODone (DESYREL) 50 MG tablet TAKE 1/2 TO 1 TABLET(25 TO 50 MG) BY MOUTH AT BEDTIME AS NEEDED FOR SLEEP 30 tablet 3  . zinc gluconate 50 MG tablet Take 50 mg by mouth daily.    Marland Kitchen atorvastatin (LIPITOR) 20 MG tablet Take 1 tablet (20 mg total) by mouth daily.  (Patient not taking: Reported on 11/25/2019) 90 tablet 3   No current facility-administered medications for this encounter.    Physical Findings: The patient is in no acute distress. Patient is alert and oriented.  height is 5\' 3"  (1.6 m) and weight is 133 lb 6.4 oz (60.5 kg). Her temperature is 99.1 F (37.3 C). Her blood pressure is 135/77 and her pulse is 82. Her respiration is 18 and oxygen saturation is 100%.   Lungs are clear to auscultation bilaterally. Heart has regular rate and rhythm. No palpable cervical, supraclavicular, or axillary adenopathy. Abdomen soft, non-tender, normal bowel sounds.  On pelvic examination the external genitalia were unremarkable. A speculum exam was performed. There are no mucosal lesions noted in the vaginal vault.  On bimanual examination there were no pelvic masses appreciated.  Vaginal cuff intact  Lab Findings: Lab Results  Component Value Date   WBC 9.2 11/21/2017   HGB 13.8 11/21/2017   HCT 40.7 11/21/2017   MCV 104.1 (H) 11/21/2017   PLT 302.0 11/21/2017    Radiographic Findings: No results found.  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   No evidence of recurrence on  clinical exam.  Plan:  Patient will follow up with Dr. Denman George in six months. Follow up with radiation oncology in one year.  ____________________________________  Blair Promise, PhD, MD  This document serves as a record of services personally performed by Gery Pray, MD. It was created on his behalf by Clerance Lav, a trained medical scribe. The creation of this record is based on the scribe's personal observations and the provider's statements to them. This document has been checked and approved by the attending provider.

## 2019-11-25 NOTE — Progress Notes (Signed)
Weight and vitals stable. Denies pain. Denies any bowel or bladder issues. Denies any rectal or vaginal bleeding. Denies any vaginal odor, itching or discharge. Patient without any complaints today. Patient understands she should follow up with Dr. Denman George in six months.   BP 135/77   Pulse 82   Temp 99.1 F (37.3 C)   Resp 18   Ht 5\' 3"  (1.6 m)   Wt 133 lb 6.4 oz (60.5 kg)   SpO2 100%   BMI 23.63 kg/m  Wt Readings from Last 3 Encounters:  11/25/19 133 lb 6.4 oz (60.5 kg)  05/06/19 131 lb 6.4 oz (59.6 kg)  11/22/18 134 lb 4 oz (60.9 kg)

## 2020-01-21 DIAGNOSIS — D485 Neoplasm of uncertain behavior of skin: Secondary | ICD-10-CM | POA: Diagnosis not present

## 2020-01-21 DIAGNOSIS — C44622 Squamous cell carcinoma of skin of right upper limb, including shoulder: Secondary | ICD-10-CM | POA: Diagnosis not present

## 2020-01-21 DIAGNOSIS — L814 Other melanin hyperpigmentation: Secondary | ICD-10-CM | POA: Diagnosis not present

## 2020-01-21 DIAGNOSIS — L905 Scar conditions and fibrosis of skin: Secondary | ICD-10-CM | POA: Diagnosis not present

## 2020-01-21 DIAGNOSIS — D229 Melanocytic nevi, unspecified: Secondary | ICD-10-CM | POA: Diagnosis not present

## 2020-01-21 DIAGNOSIS — L819 Disorder of pigmentation, unspecified: Secondary | ICD-10-CM | POA: Diagnosis not present

## 2020-01-21 DIAGNOSIS — L821 Other seborrheic keratosis: Secondary | ICD-10-CM | POA: Diagnosis not present

## 2020-01-21 DIAGNOSIS — Z85828 Personal history of other malignant neoplasm of skin: Secondary | ICD-10-CM | POA: Diagnosis not present

## 2020-02-10 ENCOUNTER — Telehealth: Payer: Self-pay | Admitting: *Deleted

## 2020-02-10 NOTE — Telephone Encounter (Signed)
Called and left the patient a message with her follow up appt date/time

## 2020-02-25 ENCOUNTER — Other Ambulatory Visit: Payer: Self-pay

## 2020-03-03 DIAGNOSIS — L905 Scar conditions and fibrosis of skin: Secondary | ICD-10-CM | POA: Diagnosis not present

## 2020-03-03 DIAGNOSIS — Z85828 Personal history of other malignant neoplasm of skin: Secondary | ICD-10-CM | POA: Diagnosis not present

## 2020-03-03 DIAGNOSIS — L570 Actinic keratosis: Secondary | ICD-10-CM | POA: Diagnosis not present

## 2020-03-12 ENCOUNTER — Other Ambulatory Visit: Payer: Self-pay | Admitting: Family Medicine

## 2020-04-14 DIAGNOSIS — Z03818 Encounter for observation for suspected exposure to other biological agents ruled out: Secondary | ICD-10-CM | POA: Diagnosis not present

## 2020-04-14 DIAGNOSIS — J069 Acute upper respiratory infection, unspecified: Secondary | ICD-10-CM | POA: Diagnosis not present

## 2020-05-06 NOTE — Progress Notes (Signed)
Phone [phone] In person visit   Subjective:   Jacqueline Hammond is a 69 y.o. year old very pleasant female patient who presents for/with See problem oriented charting Chief Complaint  Patient presents with  . Cancer Care   This visit occurred during the SARS-CoV-2 public health emergency.  Safety protocols were in place, including screening questions prior to the visit, additional usage of staff PPE, and extensive cleaning of exam room while observing appropriate contact time as indicated for disinfecting solutions.   Past Medical History-  Patient Active Problem List   Diagnosis Date Noted  . Chemotherapy-induced peripheral neuropathy ([place]) 04/09/2016    Priority: High  . Continuous tobacco abuse 11/13/2015    Priority: High  . Endometrial cancer, FIGO stage IIIC ([place]) 11/09/2015    Priority: High  . Aortic atherosclerosis ([place]) 05/07/2020    Priority: Medium  . Osteopenia determined by x-ray 11/13/2015    Priority: Medium  . Ectatic abdominal aorta ([place]) 11/13/2015    Priority: Medium  . Hx of adenomatous colonic polyps 11/13/2015    Priority: Medium  . Hematuria 12/04/2012    Priority: Medium  . Hyperlipidemia 05/08/2008    Priority: Medium  . Family history of melanoma 10/10/2014    Priority: Low  . Former smoker 10/10/2014    Priority: Low  . Insomnia 10/10/2014    Priority: Low  . Restless legs 12/18/2015  . Constipation 11/28/2015  . Macrocytosis without anemia 11/13/2015    Medications- reviewed and updated Current Outpatient Medications  Medication Sig Dispense Refill  . Ascorbic Acid (VITAMIN C) 1000 MG tablet Take 1,000 mg by mouth daily.    Marland Kitchen b complex vitamins capsule Take 1 capsule by mouth daily.    . Multiple Vitamin (MULTIVITAMIN) tablet Take 1 tablet by mouth daily.    . Omega-3 Fatty Acids (FISH OIL PO) Take by mouth daily.    . traZODone (DESYREL) 50 MG tablet TAKE 1/2 TO 1 TABLET(25 TO 50 MG) BY MOUTH AT BEDTIME AS NEEDED FOR SLEEP 30 tablet  3   No current facility-administered medications for this visit.     Objective:  BP 118/84   Pulse 91   Temp 97.8 F (36.6 C) (Temporal)   Resp 18   Ht 5\' 3"  (1.6 m)   Wt 132 lb 9.6 oz (60.1 kg)   SpO2 96%   BMI 23.49 kg/m  Gen: NAD, resting comfortably CV: RRR no murmurs rubs or gallops Lungs: CTAB no crackles, wheeze, rhonchi Ext: no edema Skin: warm, dry     Assessment and Plan   #social update- son died of esophageal cancer (really hard on her obviously)  # Cancer Care Follow up-history of endometrial cancer #chemotherapy induced neuropathy S:Patient stated that she has 2 more visits with the oncology and she wanted to follow up with [place] due her not having any recent bloodwork.   Patient with history of endometrial cancer stage IIIc and history of metastasis and neuropathy from chemotherapy. Patient has done incredibly well since finished treatments in 2017. Ongoing neuropathy from chemotherapy. A/P: patient has had excellent care and has just 2 more visits then released back fully to primary care.   Neuropathy stable without meds- continue to monitor   #hyperlipidemia with history of some myalgias/ aortic atherosclerosis S: Medication: in the past Atorvastatin 20 mg, fish oil  Lab Results  Component Value Date   CHOL 190 11/21/2017   HDL 63.60 11/21/2017   LDLCALC 114 (H) 11/21/2017   LDLDIRECT 123 (H) 10/10/2014  TRIG 65.0 11/21/2017   CHOLHDL 3 11/21/2017   A/P: last #s high and patient off meds- we will update lipid panel to see where things stand.    #Insomnia S: Medication: Trazodone as needed very rarely.  A/P: doing well does not need refill yet- she can reach out if she needs this   #Tobacco abuse S: do to 3 cigarettes a day. Trying to cut out middle one- down from breakfast, lunch, and dinner.  -Patient has been nervous about hallucinations on Chantix. She has tried nicotine replacement in the past without success.  We have previously discussed  lung cancer screening in 2019-today she states interested  A/P: tobacco abuse has made significant strides- congratulated on being down to 3 a day. Has not tolerated medication interventions in past. Encouraged her to continue to cut back as she has done well with that.  Goal of 2 or less in 6 months   #Ectatic abdominal aorta S: Abdominal CT 10/28/2015 with focal ectasia of the infrarenal abdominal aorta up to 2.9 cm and they noted a mural thrombus. Suggestion was to repeat imaging in 5 years. She also had some aortic atherosclerosis noted.  A/P: luckily bp well controlled. Need to look at lipids and encouraged quitting smoking. Recheck likely next visit.     # Low Bone density (formerly osteopenia) S: Last DEXA: [facility] 10/27/2015  Calcium: 1200mg  (through diet ok) recommended  Vitamin D: 1000 units a day recommended Last vitamin D No results found for: 25OHVITD2, 25OHVITD3, VD25OH   A/P: recommended follow up with GYN so dexa can be on same machine. Recommended calcium/vitamin/weight bearing exercise.    #history of fever around mid last month- would like to do covid 19 antibodies. Has some lingering fatigue which is improving  # anemia and macrocytic anemia- will check b12, folate rbc, iron stores  Recommended follow up: Return in about 6 months (around 11/05/2020) for physical or sooner if needed. Future Appointments  Date Time Provider [place]  05/15/2020  1:30 PM Everitt Amber, MD [facility] None  11/26/2020 11:00 AM Gery Pray, MD [facility] None    Lab/Order associations:   ICD-10-CM   1. Macrocytic anemia  D53.9 Vitamin B12    Folate RBC  2. Chemotherapy-induced peripheral neuropathy (HCC) Chronic G62.0    T45.1X5A   3. Ectatic abdominal aorta (HCC) Chronic I77.811   4. Aortic atherosclerosis (HCC)  I70.0   5. Hyperlipidemia, unspecified hyperlipidemia type  E78.5 CBC With Differential/Platelet    Lipid Panel (Refl)    COMPLETE METABOLIC  PANEL WITH GFR    POCT Urinalysis Dipstick (Automated)    TSH  6. Anemia, unspecified type  D64.9 Iron, TIBC and Ferritin Panel  7. History of fever  Z87.898 SARS CoV2 Serology(COVID19) AB(IgG,IgM),Immunoassay   Return precautions advised.  [name], MD

## 2020-05-06 NOTE — Patient Instructions (Addendum)
Health Maintenance Due  Topic Date Due  . TETANUS/TDAP - recommend at the pharmacy (wait 2-4 weeks) 11/13/2019  . INFLUENZA VACCINE In office flu shot  03/01/2020   Calcium: 1200mg  (through diet ok) recommended  Vitamin D: 1000 units a day recommended  Please stop by lab before you go If you have mychart- we will send your results within 3 business days of [place] receiving them.  If you do not have mychart- we will call you about results within 5 business days of [place] receiving them.  *please note we are currently using [facility] which has a longer processing time than [place] typically so labs may not come back as quickly as in the past *please also note that you will see labs on mychart as soon as they post. I will later go in and write notes on them- will say "notes from Dr. Yong Channel"

## 2020-05-07 ENCOUNTER — Encounter: Payer: Self-pay | Admitting: Family Medicine

## 2020-05-07 ENCOUNTER — Other Ambulatory Visit: Payer: Self-pay

## 2020-05-07 ENCOUNTER — Ambulatory Visit (INDEPENDENT_AMBULATORY_CARE_PROVIDER_SITE_OTHER): Payer: PPO | Admitting: Family Medicine

## 2020-05-07 VITALS — BP 118/84 | HR 91 | Temp 97.8°F | Resp 18 | Ht 63.0 in | Wt 132.6 lb

## 2020-05-07 DIAGNOSIS — I77811 Abdominal aortic ectasia: Secondary | ICD-10-CM

## 2020-05-07 DIAGNOSIS — D539 Nutritional anemia, unspecified: Secondary | ICD-10-CM

## 2020-05-07 DIAGNOSIS — Z87898 Personal history of other specified conditions: Secondary | ICD-10-CM

## 2020-05-07 DIAGNOSIS — D649 Anemia, unspecified: Secondary | ICD-10-CM

## 2020-05-07 DIAGNOSIS — I700 Atherosclerosis of aorta: Secondary | ICD-10-CM | POA: Diagnosis not present

## 2020-05-07 DIAGNOSIS — Z23 Encounter for immunization: Secondary | ICD-10-CM | POA: Diagnosis not present

## 2020-05-07 DIAGNOSIS — E785 Hyperlipidemia, unspecified: Secondary | ICD-10-CM

## 2020-05-07 DIAGNOSIS — T451X5A Adverse effect of antineoplastic and immunosuppressive drugs, initial encounter: Secondary | ICD-10-CM | POA: Diagnosis not present

## 2020-05-07 DIAGNOSIS — G620 Drug-induced polyneuropathy: Secondary | ICD-10-CM | POA: Diagnosis not present

## 2020-05-07 NOTE — Addendum Note (Signed)
Addended by: Thomes Cake on: 05/07/2020 03:21 PM   Modules accepted: Orders

## 2020-05-08 ENCOUNTER — Telehealth: Payer: Self-pay

## 2020-05-08 ENCOUNTER — Other Ambulatory Visit: Payer: Self-pay

## 2020-05-08 DIAGNOSIS — E785 Hyperlipidemia, unspecified: Secondary | ICD-10-CM

## 2020-05-08 DIAGNOSIS — R319 Hematuria, unspecified: Secondary | ICD-10-CM

## 2020-05-08 LAB — COMPLETE METABOLIC PANEL WITH GFR
AG Ratio: 1.4 (calc) (ref 1.0–2.5)
ALT: 13 U/L (ref 6–29)
AST: 17 U/L (ref 10–35)
Albumin: 4.1 g/dL (ref 3.6–5.1)
Alkaline phosphatase (APISO): 110 U/L (ref 37–153)
BUN/Creatinine Ratio: 12 (calc) (ref 6–22)
BUN: 16 mg/dL (ref 7–25)
CO2: 27 mmol/L (ref 20–32)
Calcium: 10 mg/dL (ref 8.6–10.4)
Chloride: 101 mmol/L (ref 98–110)
Creat: 1.32 mg/dL — ABNORMAL HIGH (ref 0.50–0.99)
GFR, Est African American: 48 mL/min/{1.73_m2} — ABNORMAL LOW (ref >=60)
GFR, Est Non African American: 41 mL/min/{1.73_m2} — ABNORMAL LOW (ref >=60)
Globulin: 2.9 g/dL (calc) (ref 1.9–3.7)
Glucose, Bld: 90 mg/dL (ref 65–99)
Potassium: 4.2 mmol/L (ref 3.5–5.3)
Sodium: 138 mmol/L (ref 135–146)
Total Bilirubin: 0.3 mg/dL (ref 0.2–1.2)
Total Protein: 7 g/dL (ref 6.1–8.1)

## 2020-05-08 LAB — POC URINALSYSI DIPSTICK (AUTOMATED)
Bilirubin, UA: NEGATIVE
Blood, UA: POSITIVE
Glucose, UA: NEGATIVE
Ketones, UA: NEGATIVE
Leukocytes, UA: NEGATIVE
Nitrite, UA: POSITIVE
Protein, UA: NEGATIVE
Spec Grav, UA: 1.015 (ref 1.010–1.025)
Urobilinogen, UA: 0.2 E.U./dL
pH, UA: 6 (ref 5.0–8.0)

## 2020-05-08 LAB — SARS COV-2 SEROLOGY(COVID-19)AB(IGG,IGM),IMMUNOASSAY
SARS CoV-2 AB IgG: NEGATIVE
SARS CoV-2 IgM: NEGATIVE

## 2020-05-08 LAB — LIPID PANEL (REFL)
Cholesterol: 286 mg/dL — ABNORMAL HIGH (ref <200)
HDL: 53 mg/dL (ref >=50)
LDL Cholesterol (Calc): 193 mg/dL (calc) — ABNORMAL HIGH
Non-HDL Cholesterol (Calc): 233 mg/dL (calc) — ABNORMAL HIGH (ref <130)
Total CHOL/HDL Ratio: 5.4 (calc) — ABNORMAL HIGH (ref <5.0)
Triglycerides: 210 mg/dL — ABNORMAL HIGH (ref <150)

## 2020-05-08 LAB — CBC WITH DIFFERENTIAL/PLATELET
Absolute Monocytes: 631 cells/uL (ref 200–950)
Basophils Absolute: 53 cells/uL (ref 0–200)
Basophils Relative: 0.7 %
Eosinophils Absolute: 122 cells/uL (ref 15–500)
Eosinophils Relative: 1.6 %
HCT: 39.3 % (ref 35.0–45.0)
Hemoglobin: 13 g/dL (ref 11.7–15.5)
Lymphs Abs: 2364 cells/uL (ref 850–3900)
MCH: 33.9 pg — ABNORMAL HIGH (ref 27.0–33.0)
MCHC: 33.1 g/dL (ref 32.0–36.0)
MCV: 102.6 fL — ABNORMAL HIGH (ref 80.0–100.0)
MPV: 9.9 fL (ref 7.5–12.5)
Monocytes Relative: 8.3 %
Neutro Abs: 4431 cells/uL (ref 1500–7800)
Neutrophils Relative %: 58.3 %
Platelets: 409 10*3/uL — ABNORMAL HIGH (ref 140–400)
RBC: 3.83 10*6/uL (ref 3.80–5.10)
RDW: 12.4 % (ref 11.0–15.0)
Total Lymphocyte: 31.1 %
WBC: 7.6 10*3/uL (ref 3.8–10.8)

## 2020-05-08 LAB — IRON,TIBC AND FERRITIN PANEL
%SAT: 26 % (calc) (ref 16–45)
Ferritin: 132 ng/mL (ref 16–288)
Iron: 90 ug/dL (ref 45–160)
TIBC: 346 mcg/dL (calc) (ref 250–450)

## 2020-05-08 LAB — FOLATE RBC: RBC Folate: 723 ng/mL RBC (ref >280)

## 2020-05-08 LAB — TSH: TSH: 0.91 mIU/L (ref 0.40–4.50)

## 2020-05-08 LAB — VITAMIN B12: Vitamin B-12: 460 pg/mL (ref 200–1100)

## 2020-05-08 NOTE — Telephone Encounter (Signed)
Patient is returning Keba's call about lab results. 

## 2020-05-11 ENCOUNTER — Other Ambulatory Visit: Payer: Self-pay

## 2020-05-11 ENCOUNTER — Other Ambulatory Visit: Payer: PPO

## 2020-05-11 ENCOUNTER — Telehealth: Payer: Self-pay

## 2020-05-11 DIAGNOSIS — R319 Hematuria, unspecified: Secondary | ICD-10-CM

## 2020-05-11 MED ORDER — ATORVASTATIN CALCIUM 20 MG PO TABS: 20.0000 mg | ORAL_TABLET | Freq: Every day | ORAL | 3 refills | Status: DC

## 2020-05-11 NOTE — Telephone Encounter (Signed)
Refill sent in

## 2020-05-11 NOTE — Telephone Encounter (Signed)
Pt asked for Dr. Yong Channel to prescribe her Atorvastatin. It does not look like the pt has been prescribed this. Please advise.

## 2020-05-12 LAB — URINALYSIS, MICROSCOPIC ONLY: Hyaline Cast: NONE SEEN /LPF

## 2020-05-13 ENCOUNTER — Other Ambulatory Visit: Payer: Self-pay

## 2020-05-13 ENCOUNTER — Other Ambulatory Visit: Payer: PPO

## 2020-05-13 DIAGNOSIS — R350 Frequency of micturition: Secondary | ICD-10-CM

## 2020-05-13 DIAGNOSIS — R3915 Urgency of urination: Secondary | ICD-10-CM

## 2020-05-13 MED ORDER — CEPHALEXIN 500 MG PO CAPS: 500.0000 mg | ORAL_CAPSULE | Freq: Three times a day (TID) | ORAL | 0 refills | Status: DC

## 2020-05-15 ENCOUNTER — Other Ambulatory Visit: Payer: Self-pay

## 2020-05-15 ENCOUNTER — Inpatient Hospital Stay: Payer: PPO | Attending: Gynecologic Oncology | Admitting: Gynecologic Oncology

## 2020-05-15 ENCOUNTER — Encounter: Payer: Self-pay | Admitting: Gynecologic Oncology

## 2020-05-15 VITALS — BP 139/86 | HR 90 | Temp 97.3°F | Resp 18 | Wt 135.6 lb

## 2020-05-15 DIAGNOSIS — E785 Hyperlipidemia, unspecified: Secondary | ICD-10-CM | POA: Insufficient documentation

## 2020-05-15 DIAGNOSIS — F17210 Nicotine dependence, cigarettes, uncomplicated: Secondary | ICD-10-CM | POA: Diagnosis not present

## 2020-05-15 DIAGNOSIS — R011 Cardiac murmur, unspecified: Secondary | ICD-10-CM | POA: Insufficient documentation

## 2020-05-15 DIAGNOSIS — Z90710 Acquired absence of both cervix and uterus: Secondary | ICD-10-CM | POA: Diagnosis not present

## 2020-05-15 DIAGNOSIS — M8580 Other specified disorders of bone density and structure, unspecified site: Secondary | ICD-10-CM | POA: Diagnosis not present

## 2020-05-15 DIAGNOSIS — C541 Malignant neoplasm of endometrium: Secondary | ICD-10-CM | POA: Diagnosis not present

## 2020-05-15 DIAGNOSIS — K219 Gastro-esophageal reflux disease without esophagitis: Secondary | ICD-10-CM | POA: Insufficient documentation

## 2020-05-15 DIAGNOSIS — Z923 Personal history of irradiation: Secondary | ICD-10-CM | POA: Diagnosis not present

## 2020-05-15 DIAGNOSIS — Z90722 Acquired absence of ovaries, bilateral: Secondary | ICD-10-CM | POA: Diagnosis not present

## 2020-05-15 DIAGNOSIS — C7982 Secondary malignant neoplasm of genital organs: Secondary | ICD-10-CM

## 2020-05-15 DIAGNOSIS — Z79899 Other long term (current) drug therapy: Secondary | ICD-10-CM | POA: Diagnosis not present

## 2020-05-15 DIAGNOSIS — Z9221 Personal history of antineoplastic chemotherapy: Secondary | ICD-10-CM

## 2020-05-15 LAB — URINE CULTURE
MICRO NUMBER:: 11066806
SPECIMEN QUALITY:: ADEQUATE

## 2020-05-15 NOTE — Patient Instructions (Signed)
Please notify Dr Denman George at phone number [phone] if you notice vaginal bleeding, new pelvic or abdominal pains, bloating, feeling full easy, or a change in bladder or bowel function.   Please have Dr Clabe Seal office contact Dr Serita Grit office (at [phone]) in April after your appointment with him to request an appointment with Dr Denman George for October, 2022.

## 2020-05-15 NOTE — Progress Notes (Signed)
Follow-up Note: Gyn-Onc   Jacqueline Hammond 69 y.o. female  Chief Complaint  Patient presents with  . Secondary malignant neoplasm of cervix  . Endometrial cancer, FIGO stage lllC    Assessment :Stage IIIC1 (with ITC's) serous high grade endometrial adenocarcioma. Cervical stromal involvement. s/p adjuvant therapy (chemotherapy and radiation) completed in September, 2017 with no evidence of recurrence.  Plan   She has no significant persistent toxicities to therapy. Post treatment imaging unremarkable.  I discussed symptoms concerning for maligancy and notified her that she should inform us of these should they develop.   I will see her in 12 months.  HPI: 69 year old white married female gravida 2 seen in consultation request of Dr. Abbie Sons regarding management of a newly diagnosed endometrial carcinoma. The patient initially had some spotting and a Pap smear revealing atypical glandular cells. She underwent sonohysterogram and endometrial biopsy on 10/19/2015 revealing an endometrial adenocarcinoma with serous features (high-grade) the patient is currently not having any bleeding and has no pelvic symptoms. The uterus measured 7.4 x 3.5 x 3.0 cm with an endometrial stripe of 6.8 mm.  She has no significant past gynecologic history although she had a tubal ligation through a Pfannenstiel incision. Subsequently she underwent diagnostic laparoscopy and lysis of adhesions for pelvic pain which is now resolved. She claims her Pap smears the past and been normal. She has no other significant medical history.  On 10/29/15 she underwent a robotic hysterectomy, BSO and sentinel lymph node biopsy of pelvic and PA nodes at [facility] with Dr Clarene Essex. Surgery was uncomplicated. Postoperative pathology revealed a 32mm tumor of high grade serous adenocarcinoma involving the cervical stroma with no LVSI. There was outer half myometrial infiltration (12 of 83mm). The left and right pelvic SLN's were positive  for ITC's on IHC.  Interval Hx:   She commenced "sandwich" adjuvant therapy with 6 cycles (total) of chemotherapy with carboplatin and paclitaxel completed on 04/21/16, and pelvic external beam radiation (with brachytherapy) completed on 02-23-16.  She did very well during treatment.   She has no symptoms of recurrence.   Review of Systems:10 point review of systems is negative except as noted in interval history.   Vitals: Blood pressure 139/86, pulse 90, temperature (!) 97.3 F (36.3 C), temperature source Tympanic, resp. rate 18, weight 135 lb 9.6 oz (61.5 kg), SpO2 99 %.  Physical Exam: General : The patient is a healthy woman in no acute distress.  HEENT: normocephalic, extraoccular movements normal; neck is supple without thyromegally  Lynphnodes: Supraclavicular and inguinal nodes not enlarged  Abdomen: Soft, non-tender, no ascites, no organomegally, no masses, no hernias , well-healed laparoscopic incisions. Pelvic:  EGBUS: Normal female  Vagina: vaginal cuff in tact. Shortened but grossly normal and free of disease. Urethra and Bladder: Normal, non-tender Cervix: surgically absent Uterus: surgically absent Bi-manual examination: Non-tender; no masses Rectal: deferred Lower extremities: No edema or varicosities. Normal range of motion      Allergies  Allergen Reactions  . Codeine Sulfate     REACTION: nausea, pass out  . Neomycin-Bacitracin Zn-Polymyx     REACTION: redness    Past Medical History:  Diagnosis Date  . Adenocarcinoma of endometrium ([place]) 09/2015  . Allergy    mild seasonal  . Atypical glandular cells of undetermined significance (AGUS) on cervical Pap smear 09/2015  . GERD (gastroesophageal reflux disease)   . Heart murmur    as teenager but not mentioned since  . History of chemotherapy    completed nov  2017  . History of radiation therapy 01/19/16-03/24/16   pelvis 45 Gy in 25 fx, vaginal cuff 18 Gy in 3 fx completed nov 2017  . Hyperlipidemia    . Osteopenia 09/2015   T score -1.4 FRAX 8.5%/0.8%    Past Surgical History:  Procedure Laterality Date  . COLONOSCOPY    . IR GENERIC HISTORICAL  03/02/2016   IR US GUIDE VASC ACCESS RIGHT 03/02/2016 Arne Cleveland, MD [facility]INTER[facility]  . IR GENERIC HISTORICAL  03/02/2016   IR FLUORO GUIDE CV LINE RIGHT 03/02/2016 Arne Cleveland, MD [facility]INTER[facility]  . LAPAROSCOPY    . POLYPECTOMY    . ROBOTIC ASSISTED TOTAL HYSTERECTOMY WITH BILATERAL SALPINGO OOPHERECTOMY  10/29/15   robotic hysterectomy, BSO and sentinel lymph node biopsy of pelvic and PA nodes at [facility] with Dr Clarene Essex.   . TUBAL LIGATION      Current Outpatient Medications  Medication Sig Dispense Refill  . Ascorbic Acid (VITAMIN C) 1000 MG tablet Take 1,000 mg by mouth daily.    Marland Kitchen atorvastatin (LIPITOR) 20 MG tablet Take 1 tablet (20 mg total) by mouth daily. 90 tablet 3  . b complex vitamins capsule Take 1 capsule by mouth daily.    . cephALEXin (KEFLEX) 500 MG capsule Take 1 capsule (500 mg total) by mouth 3 (three) times daily. 21 capsule 0  . Multiple Vitamin (MULTIVITAMIN) tablet Take 1 tablet by mouth daily.    . Omega-3 Fatty Acids (FISH OIL PO) Take by mouth daily.    . traZODone (DESYREL) 50 MG tablet TAKE 1/2 TO 1 TABLET(25 TO 50 MG) BY MOUTH AT BEDTIME AS NEEDED FOR SLEEP 30 tablet 3   No current facility-administered medications for this visit.    Social History   Socioeconomic History  . Marital status: Married    Spouse name: Not on file  . Number of children: 2  . Years of education: Not on file  . Highest education level: Not on file  Occupational History  . Not on file  Tobacco Use  . Smoking status: Current Some Day Smoker    Packs/day: 1.00    Years: 32.00    Pack years: 32.00    Types: Cigarettes  . Smokeless tobacco: Never Used  . Tobacco comment: 3 cigarettes/ day as of 06-30-16  Vaping Use  . Vaping Use: Never used  Substance and Sexual Activity  . Alcohol use: No    Alcohol/week: 0.0 standard drinks   . Drug use: No  . Sexual activity: Yes    Comment: 1st intercourse 60 yo-1 partner  Other Topics Concern  . Not on file  Social History Narrative   Married 1970 (Jacqueline Hammond is a patient here). 1 living son (1 son died of esophageal cancer- had daughter Marolyn Hammock and they were closed). 3 grandchildren. 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 Difficulty of Paying Living Expenses: Not on [place]:   . Worried About Charity fundraiser in the Last Year: Not on file  . Ran Out of Food in the Last Year: Not on file  Transportation Needs:   . Lack of Transportation (Medical): Not on file  . Lack of Transportation (Non-Medical): Not on file  Physical Activity:   . Days of Exercise per Week: Not on file  . Minutes of Exercise per Session: Not on file  Stress:   . Feeling of Stress : Not on file  Social Connections:   . Frequency of Communication with Friends and Family: Not on file  . Frequency of Social Gatherings with Friends and Family: Not on file  . Attends Religious Services: Not on file  . Active Member of Clubs or Organizations: Not on file  . Attends Archivist Meetings: Not on file  . Marital Status: Not on file  Intimate Partner Violence:   . Fear of Current or Ex-Partner: Not on file  . Emotionally Abused: Not on file  . Physically Abused: Not on file  . Sexually Abused: Not on file    Family History  Problem Relation Age of Onset  . Cancer Mother        larynx  . Heart failure Father   . Heart attack Father 49       smoker, war vet  . Melanoma Father   . Heart attack Brother 51  . Colon polyps Brother   . Colon cancer Paternal Grandmother 39  . Colon polyps Sister   . Esophageal  cancer Son   . Rectal cancer Neg Hx   . Stomach cancer Neg Hx      Thereasa Solo, MD 05/15/2020, 2:12 PM

## 2020-07-06 ENCOUNTER — Ambulatory Visit: Payer: Self-pay | Admitting: Obstetrics and Gynecology

## 2020-07-06 DIAGNOSIS — Z0289 Encounter for other administrative examinations: Secondary | ICD-10-CM

## 2020-08-06 DIAGNOSIS — Z1231 Encounter for screening mammogram for malignant neoplasm of breast: Secondary | ICD-10-CM | POA: Diagnosis not present

## 2020-08-11 ENCOUNTER — Encounter: Payer: Self-pay | Admitting: Obstetrics and Gynecology

## 2020-08-11 ENCOUNTER — Ambulatory Visit (INDEPENDENT_AMBULATORY_CARE_PROVIDER_SITE_OTHER): Payer: PPO | Admitting: Obstetrics and Gynecology

## 2020-08-11 ENCOUNTER — Other Ambulatory Visit: Payer: Self-pay

## 2020-08-11 VITALS — BP 122/80 | Ht 63.0 in | Wt 133.0 lb

## 2020-08-11 DIAGNOSIS — Z1272 Encounter for screening for malignant neoplasm of vagina: Secondary | ICD-10-CM

## 2020-08-11 DIAGNOSIS — M8580 Other specified disorders of bone density and structure, unspecified site: Secondary | ICD-10-CM | POA: Diagnosis not present

## 2020-08-11 DIAGNOSIS — Z01419 Encounter for gynecological examination (general) (routine) without abnormal findings: Secondary | ICD-10-CM | POA: Diagnosis not present

## 2020-08-11 NOTE — Progress Notes (Signed)
   ERMINIE FOULKS 11/13/1950 [id number]  SUBJECTIVE:  70 y.o. G2P0001 female for a routine breast and pelvic exam and Pap smear.  Last seen in this office 09/2015 at which time she had atypical glandular cells on a Pap smear, then had subsequent diagnosis of endometrial cancer.  She is a survivor of stage IIIC1 serous high-grade endometrial adenocarcinoma. She has no gynecologic concerns.  Current Outpatient Medications  Medication Sig Dispense Refill  . Ascorbic Acid (VITAMIN C) 1000 MG tablet Take 1,000 mg by mouth daily.    Marland Kitchen atorvastatin (LIPITOR) 20 MG tablet Take 1 tablet (20 mg total) by mouth daily. 90 tablet 3  . b complex vitamins capsule Take 1 capsule by mouth daily.    . Multiple Vitamin (MULTIVITAMIN) tablet Take 1 tablet by mouth daily.    . Omega-3 Fatty Acids (FISH OIL PO) Take by mouth daily.    . traZODone (DESYREL) 50 MG tablet TAKE 1/2 TO 1 TABLET(25 TO 50 MG) BY MOUTH AT BEDTIME AS NEEDED FOR SLEEP 30 tablet 3   No current facility-administered medications for this visit.   Allergies: Codeine sulfate and Neomycin-bacitracin zn-polymyx  No LMP recorded. Patient is postmenopausal.  Past medical history,surgical history, problem list, medications, allergies, family history and social history were all reviewed and documented as reviewed in the EPIC chart.  ROS: Pertinent positives and negatives as reviewed in HPI   OBJECTIVE:  BP 122/80 (BP Location: Right Arm, Patient Position: Sitting, Cuff Size: Normal)   Ht 5\' 3"  (1.6 m)   Wt 133 lb (60.3 kg)   BMI 23.56 kg/m  The patient appears well, alert, oriented, in no distress.  BREAST EXAM: breasts appear normal, no suspicious masses, no skin or nipple changes or axillary nodes  PELVIC EXAM: VULVA: normal appearing vulva with atrophic changes, no masses, tenderness or lesions, VAGINA: normal appearing vagina with atrophic changes, normal color and discharge, mild postradiation changes and cuff scarring, no lesions,  CERVIX: Surgically absent, UTERUS: Surgically absent, vaginal cuff normal, ADNEXA: normal adnexa in size, nontender and no masses, PAP: Pap smear done today, thin-prep method  Chaperone: Wandra Scot Bonham present during the examination  ASSESSMENT:  70 y.o. G2P0001 here for annual gynecologic exam  PLAN:   1. Postmenopausal.  No significant menopausal symptoms at this point. 2. Pap smear 09/2015 showed atypical glandular cells NOS. This led to an endometrial biopsy with finding of endometrial adenocarcinoma.  She subsequently had a robotic hysterectomy BSO on sentinel lymph node biopsy 09/2015 at [facility] with Dr. Clarene Essex.  Stage IIIC1 lesion.  She completed chemotherapy and radiation.  Follows with Dr. Denman George annually.  Went ahead and collected a Pap smear from the vaginal cuff today and if normal she does not need to necessarily continue with Pap smears moving forward.  Exam NED today. 3. Mammogram 08/2020.  Normal breast exam today.  Will continue with annual mammograms. 4. Colonoscopy 2018.  She will follow up at the recommended interval.  5. Osteopenia. DEXA 09/2015.  Overdue for DEXA so she will plan to get that scheduled.  6. Health maintenance.  No labs today as she normally has these completed with her primary care doctor.  Return annually or sooner, prn.  Joseph Pierini MD 08/11/20

## 2020-08-12 ENCOUNTER — Encounter: Payer: Self-pay | Admitting: Obstetrics and Gynecology

## 2020-08-12 LAB — PAP IG W/ RFLX HPV ASCU

## 2020-09-15 ENCOUNTER — Other Ambulatory Visit: Payer: Self-pay | Admitting: Obstetrics and Gynecology

## 2020-09-15 ENCOUNTER — Other Ambulatory Visit: Payer: Self-pay

## 2020-09-15 ENCOUNTER — Ambulatory Visit (INDEPENDENT_AMBULATORY_CARE_PROVIDER_SITE_OTHER): Payer: PPO

## 2020-09-15 DIAGNOSIS — Z780 Asymptomatic menopausal state: Secondary | ICD-10-CM

## 2020-09-15 DIAGNOSIS — M8589 Other specified disorders of bone density and structure, multiple sites: Secondary | ICD-10-CM | POA: Diagnosis not present

## 2020-09-15 DIAGNOSIS — M8580 Other specified disorders of bone density and structure, unspecified site: Secondary | ICD-10-CM

## 2020-09-15 DIAGNOSIS — Z01419 Encounter for gynecological examination (general) (routine) without abnormal findings: Secondary | ICD-10-CM

## 2020-11-05 NOTE — Patient Instructions (Addendum)
Please stop by lab before you go If you have mychart- we will send your results within 3 business days of [place] receiving them.  If you do not have mychart- we will call you about results within 5 business days of [place] receiving them.  *please also note that you will see labs on mychart as soon as they post. I will later go in and write notes on them- will say "notes from Dr. Yong Channel"  Health Maintenance Due  Topic Date Due  . TETANUS/TDAP Needs to schedule an appointment at her pharmacy.  11/13/2019   Please check with your pharmacy to see if they have the shingrix vaccine. If they do- please get this immunization and update [place] by phone call or mychart with dates you receive the vaccine  Recommended follow up: Return in about 1 year (around 11/06/2021) for physical or sooner if needed.

## 2020-11-05 NOTE — Progress Notes (Signed)
Phone [phone]   Subjective:  Patient presents today for their annual physical. Chief complaint-noted.   See problem oriented charting- ROS- full  review of systems was completed and negative except for: chills sporadic but not worsening, heat intolerance- doesn't tolerate being out in the heat, urinary urgency, seasonal allergies- with some cough from this, numbness  The following were reviewed and entered/updated in epic: Past Medical History:  Diagnosis Date  . Adenocarcinoma of endometrium ([place]) 09/2015  . Allergy    mild seasonal  . Atypical glandular cells of undetermined significance (AGUS) on cervical Pap smear 09/2015  . GERD (gastroesophageal reflux disease)   . Heart murmur    as teenager but not mentioned since  . History of chemotherapy    completed nov 2017  . History of radiation therapy 01/19/16-03/24/16   pelvis 45 Gy in 25 fx, vaginal cuff 18 Gy in 3 fx completed nov 2017  . Hyperlipidemia   . Osteopenia 09/2015   T score -1.4 FRAX 8.5%/0.8%   Patient Active Problem List   Diagnosis Date Noted  . Chemotherapy-induced peripheral neuropathy ([place]) 04/09/2016    Priority: High  . Continuous tobacco abuse 11/13/2015    Priority: High  . Endometrial cancer, FIGO stage IIIC ([place]) 11/09/2015    Priority: High  . Aortic atherosclerosis ([place]) 05/07/2020    Priority: Medium  . Osteopenia determined by x-ray 11/13/2015    Priority: Medium  . Ectatic abdominal aorta ([place]) 11/13/2015    Priority: Medium  . Hx of adenomatous colonic polyps 11/13/2015    Priority: Medium  . Hematuria 12/04/2012    Priority: Medium  . Hyperlipidemia 05/08/2008    Priority: Medium  . Family history of melanoma 10/10/2014    Priority: Low  . Former smoker 10/10/2014    Priority: Low  . Insomnia 10/10/2014    Priority: Low  . Restless legs 12/18/2015  . Constipation 11/28/2015  . Macrocytosis without anemia 11/13/2015   Past Surgical History:  Procedure Laterality Date  .  COLONOSCOPY    . IR GENERIC HISTORICAL  03/02/2016   IR US GUIDE VASC ACCESS RIGHT 03/02/2016 Arne Cleveland, MD [facility]  . IR GENERIC HISTORICAL  03/02/2016   IR FLUORO GUIDE CV LINE RIGHT 03/02/2016 Arne Cleveland, MD [facility]  . LAPAROSCOPY    . POLYPECTOMY    . ROBOTIC ASSISTED TOTAL HYSTERECTOMY WITH BILATERAL SALPINGO OOPHERECTOMY  10/29/15   robotic hysterectomy, BSO and sentinel lymph node biopsy of pelvic and PA nodes at [facility] with Dr Clarene Essex.   . TUBAL LIGATION      Family History  Problem Relation Age of Onset  . Cancer Mother        larynx  . Heart failure Father   . Heart attack Father 83       smoker, war vet  . Melanoma Father   . Heart attack Brother 76  . Colon polyps Brother   . Colon cancer Paternal Grandmother 73  . Colon polyps Sister   . Esophageal cancer Son   . Rectal cancer Neg Hx   . Stomach cancer Neg Hx     Medications- reviewed and updated Current Outpatient Medications  Medication Sig Dispense Refill  . atorvastatin (LIPITOR) 20 MG tablet Take 1 tablet (20 mg total) by mouth daily. 90 tablet 3  . b complex vitamins capsule Take 1 capsule by mouth daily.    . Multiple Vitamin (MULTIVITAMIN) tablet Take 1 tablet by mouth daily.    . traZODone (DESYREL) 50 MG  tablet TAKE 1/2 TO 1 TABLET(25 TO 50 MG) BY MOUTH AT BEDTIME AS NEEDED FOR SLEEP 30 tablet 3  . VITAMIN D PO Take by mouth.     No current facility-administered medications for this visit.    Allergies-reviewed and updated Allergies  Allergen Reactions  . Codeine Sulfate     REACTION: nausea, pass out  . Neomycin-Bacitracin Zn-Polymyx     REACTION: redness    Social History   Social History Narrative   Married 1970 [name] is a patient here). 1 living son (1 son died of esophageal cancer- had daughter Marolyn Hammock and they were closed). 3 grandchildren. 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 Oropharynx normal Neck: no thyromegaly CV: RRR no murmurs rubs or gallops Lungs: CTAB no crackles, wheeze, rhonchi Abdomen: soft/nontender/nondistended/normal bowel sounds. No rebound or guarding.  Ext: no edema Skin: warm, dry Neuro: grossly normal, moves all extremities, PERRLA   Assessment and Plan   70 y.o. female presenting for annual physical.  Health Maintenance counseling: 1. Anticipatory guidance: Patient counseled regarding regular dental exams - dentures, eye exams - yearly,  avoiding smoking and second hand smoke- see below , limiting alcohol to 1 beverage per day- doesn't drink, no illicit drugs .   2. Risk factor reduction:  Advised patient of need for regular exercise and diet rich and fruits and vegetables to reduce risk of heart attack and stroke. Exercise- starting back up- walking and yardwork. Diet-reasonably healthy diet- and more consistent with lipitor.  Wt Readings from Last 3 Encounters:  11/06/20 134 lb 9.6 oz (61.1 kg)  08/11/20 133 lb (60.3 kg)  05/15/20 135 lb 9.6 oz (61.5 kg)  3. Immunizations/screenings/ancillary studies- tdap and shingrix at pharmacy Immunization History  Administered Date(s) Administered  . Fluad Quad(high Dose 65+) 10/23/2019, 05/07/2020  . Influenza Split 04/12/2012  . Influenza, High Dose Seasonal PF 10/09/2018  . Influenza,inj,Quad PF,6+ Mos 07/14/2015, 06/30/2016  . Influenza-Unspecified 08/15/2014  . PFIZER(Purple Top)SARS-COV-2 Vaccination 09/09/2019, 10/04/2019, 06/10/2020  . Pneumococcal Conjugate-13 11/07/2017  . Pneumococcal Polysaccharide-23 12/31/2018  . Td 11/12/2009  . Zoster 12/04/2012   4. Cervical cancer screening- Saw Dr. Delilah Shan in january did pap on vaginal cuff- was advised di not have to have repeat but she may consider in the future 5. Breast cancer screening-  breast exam  With Dr. Delilah Shan and mammogram 08/06/20 with 1 year repeat planned 6. Colon cancer screening - 02/20/17 with 5 year repeat due to polyp history 7. Skin cancer screening- sees derm yearly at least. advised regular sunscreen use. Denies worrisome, changing, or new skin lesions.  8. Birth control/STD check- only active with husband and hysterectomy/postmenopausal 9. Osteoporosis screening at 37- patient with osteopenia but on calcium and vitamin D-last imaging was done [facility] and we encouraged follow-up with them for repeat so it can be on the same machine - she had another in 09/15/20 and worst t score -2.3 but risk still not high enough for  additional meds . Discussed weight bearing exercise as well - smoker-last visit patient was down to 3 cigarettes a day-has not tolerated medication interventions in the past. Stable around 3-4 per day- strongly encouraged full cessation  Status of chronic or acute concerns   #Social update-patient still mourning the loss of her son to esophageal cancer around 2020  #Cancer care follow-up-history of endometrial cancer/chemotherapy-induced neuropathy S: Patient with history of endometrial cancer stage IIIc and history of metastasis and neuropathy from chemotherapy.  Patient has done very well after treatments in 2017 other than the neuropathy-which is stable time medications -thick supportive  shoes help A/P: not painful- hold off on meds   #hyperlipidemia with history of myalgias/aortic atherosclerosis S: Medication: Atorvastatin 20Mg  daily restarted at last visit with LDL above 190, OMEGA 3 Lab Results  Component Value Date   CHOL 286 (H) 05/07/2020   HDL 53 05/07/2020   LDLCALC 193 (H) 05/07/2020   LDLDIRECT 123 (H) 10/10/2014   TRIG  210 (H) 05/07/2020   CHOLHDL 5.4 (H) 05/07/2020   A/P: Ideally we would get LDL below 70 with aortic atherosclerosis-we will update LDL today-on the other hand with history of myalgias likely will maintain current dose  #Insomnia S: Medication: Trazodone as needed but uses very rarely- like once a month- slight hangover even with half a pill A/P: Stable. Continue current medications for sparing use    #Ectatic abdominal aorta S: Abdominal CT 10/28/2015 with focal ectasia of infrarenal abdominal aorta up to 2.9 cm and possible mural thrombus-suggestion was to repeat imaging in 5 years A/P: Patient now due for repeat examination-this was ordered today-patient needs to quit smoking of any control lipids better as well   #Anemia/macrocytic anemia-B12, folate, iron stores largely normal. I wonder if chemo could have been a cause- improving yearly since 2017   Recommended follow up: No follow-ups on file. Future Appointments  Date Time Provider [place]  11/26/2020 11:00 AM Gery Pray, MD [facility] None   Lab/Order associations:NOt fasting   ICD-10-CM   1. Preventative health care  Z00.00   2. Hyperlipidemia, unspecified hyperlipidemia type  E78.5   3. Former smoker  Z87.891     No orders of the defined types were placed in this encounter.   Return precautions advised.  [name], MD

## 2020-11-06 ENCOUNTER — Encounter: Payer: Self-pay | Admitting: Family Medicine

## 2020-11-06 ENCOUNTER — Other Ambulatory Visit: Payer: Self-pay

## 2020-11-06 ENCOUNTER — Ambulatory Visit (INDEPENDENT_AMBULATORY_CARE_PROVIDER_SITE_OTHER): Payer: PPO | Admitting: Family Medicine

## 2020-11-06 VITALS — BP 126/88 | HR 89 | Temp 98.2°F | Ht 63.0 in | Wt 134.6 lb

## 2020-11-06 DIAGNOSIS — Z0000 Encounter for general adult medical examination without abnormal findings: Secondary | ICD-10-CM | POA: Diagnosis not present

## 2020-11-06 DIAGNOSIS — Z87891 Personal history of nicotine dependence: Secondary | ICD-10-CM | POA: Diagnosis not present

## 2020-11-06 DIAGNOSIS — E785 Hyperlipidemia, unspecified: Secondary | ICD-10-CM

## 2020-11-06 DIAGNOSIS — I77811 Abdominal aortic ectasia: Secondary | ICD-10-CM | POA: Diagnosis not present

## 2020-11-06 DIAGNOSIS — T451X5A Adverse effect of antineoplastic and immunosuppressive drugs, initial encounter: Secondary | ICD-10-CM | POA: Diagnosis not present

## 2020-11-06 DIAGNOSIS — G620 Drug-induced polyneuropathy: Secondary | ICD-10-CM

## 2020-11-06 LAB — CBC WITH DIFFERENTIAL/PLATELET
Basophils Absolute: 0.1 10*3/uL (ref 0.0–0.1)
Basophils Relative: 0.7 % (ref 0.0–3.0)
Eosinophils Absolute: 0.1 10*3/uL (ref 0.0–0.7)
Eosinophils Relative: 1.2 % (ref 0.0–5.0)
HCT: 38.6 % (ref 36.0–46.0)
Hemoglobin: 13.1 g/dL (ref 12.0–15.0)
Lymphocytes Relative: 17 % (ref 12.0–46.0)
Lymphs Abs: 1.7 10*3/uL (ref 0.7–4.0)
MCHC: 33.8 g/dL (ref 30.0–36.0)
MCV: 103.6 fl — ABNORMAL HIGH (ref 78.0–100.0)
Monocytes Absolute: 0.5 10*3/uL (ref 0.1–1.0)
Monocytes Relative: 5.5 % (ref 3.0–12.0)
Neutro Abs: 7.5 10*3/uL (ref 1.4–7.7)
Neutrophils Relative %: 75.6 % (ref 43.0–77.0)
Platelets: 280 10*3/uL (ref 150.0–400.0)
RBC: 3.73 Mil/uL — ABNORMAL LOW (ref 3.87–5.11)
RDW: 13.2 % (ref 11.5–15.5)
WBC: 9.9 10*3/uL (ref 4.0–10.5)

## 2020-11-06 LAB — COMPREHENSIVE METABOLIC PANEL
ALT: 15 U/L (ref 0–35)
AST: 15 U/L (ref 0–37)
Albumin: 4.2 g/dL (ref 3.5–5.2)
Alkaline Phosphatase: 100 U/L (ref 39–117)
BUN: 14 mg/dL (ref 6–23)
CO2: 30 mEq/L (ref 19–32)
Calcium: 9.6 mg/dL (ref 8.4–10.5)
Chloride: 103 mEq/L (ref 96–112)
Creatinine, Ser: 0.93 mg/dL (ref 0.40–1.20)
GFR: 63.03 mL/min (ref >60.00)
Glucose, Bld: 92 mg/dL (ref 70–99)
Potassium: 3.9 mEq/L (ref 3.5–5.1)
Sodium: 138 mEq/L (ref 135–145)
Total Bilirubin: 0.5 mg/dL (ref 0.2–1.2)
Total Protein: 6.7 g/dL (ref 6.0–8.3)

## 2020-11-06 LAB — LDL CHOLESTEROL, DIRECT: Direct LDL: 88 mg/dL

## 2020-11-17 ENCOUNTER — Encounter: Payer: Self-pay | Admitting: Radiology

## 2020-11-26 ENCOUNTER — Other Ambulatory Visit: Payer: Self-pay

## 2020-11-26 ENCOUNTER — Encounter: Payer: Self-pay | Admitting: Radiation Oncology

## 2020-11-26 ENCOUNTER — Ambulatory Visit
Admission: RE | Admit: 2020-11-26 | Discharge: 2020-11-26 | Disposition: A | Discharge status: Home / Self Care | Payer: PPO | Source: Ambulatory Visit | Attending: Radiation Oncology | Admitting: Radiation Oncology

## 2020-11-26 DIAGNOSIS — C541 Malignant neoplasm of endometrium: Secondary | ICD-10-CM

## 2020-11-26 DIAGNOSIS — Z923 Personal history of irradiation: Secondary | ICD-10-CM | POA: Insufficient documentation

## 2020-11-26 DIAGNOSIS — Z08 Encounter for follow-up examination after completed treatment for malignant neoplasm: Secondary | ICD-10-CM | POA: Diagnosis not present

## 2020-11-26 DIAGNOSIS — Z79899 Other long term (current) drug therapy: Secondary | ICD-10-CM | POA: Insufficient documentation

## 2020-11-26 DIAGNOSIS — Z8542 Personal history of malignant neoplasm of other parts of uterus: Secondary | ICD-10-CM | POA: Diagnosis not present

## 2020-11-26 NOTE — Progress Notes (Signed)
Radiation Oncology         ([phone]) [phone] ________________________________  Name: Jacqueline Hammond MRN: [id number]  Date: 11/26/2020  DOB: 09/29/1950  Follow-Up Visit Note  CC: Marin Olp, MD  Dorothyann Gibbs, NP    ICD-10-CM   1. Endometrial cancer, FIGO stage IIIC (H[facility])  C54.1     Diagnosis: Stage IIIC1 high grade serous carcinoma of endometrium  Interval Since Last Radiation: Four years, eight months, and four days  Radiation treatment dates: 1) 01/19/2016-02/23/2016                                                 2) 03/08/2016, 03/17/2016, 03/24/2016 Site/dose:   1) Pelvis / 45 Gy in 25 fractions                      2) Vaginal cuff / 18 Gy in 3 fractions  Narrative:  The patient returns today for routine follow-up. She was last seen by Dr. Denman George on 05/15/2020, at which time there was no evidence of recurrence. PAP smear on 08/11/2020 was normal.  Of note, she underwent a bilateral screening mammogram on 08/06/2020 that was normal.  On review of systems, she reports doing well without any new medical issues. She denies pelvic pain abdominal bloating vaginal bleeding or discharge.  She denies any hematuria or rectal bleeding..  She did see Dr. Delilah Shan with [facility] .  He did pelvic exam and Pap smear both of which were normal.  ALLERGIES:  is allergic to codeine sulfate and neomycin-bacitracin zn-polymyx.  Meds: Current Outpatient Medications  Medication Sig Dispense Refill  . atorvastatin (LIPITOR) 20 MG tablet Take 1 tablet (20 mg total) by mouth daily. 90 tablet 3  . b complex vitamins capsule Take 1 capsule by mouth daily.    . Multiple Vitamin (MULTIVITAMIN) tablet Take 1 tablet by mouth daily.    . traZODone (DESYREL) 50 MG tablet TAKE 1/2 TO 1 TABLET(25 TO 50 MG) BY MOUTH AT BEDTIME AS NEEDED FOR SLEEP 30 tablet 3  . VITAMIN D PO Take by mouth.     No current facility-administered medications for this encounter.    Physical Findings: The patient is in no acute  distress. Patient is alert and oriented.  height is 5\' 3"  (1.6 m) and weight is 135 lb 6.4 oz (61.4 kg). Her temperature is 97.5 F (36.4 C) (abnormal). Her blood pressure is 143/86 (abnormal) and her pulse is 80. Her respiration is 20 and oxygen saturation is 100%.   Lungs are clear to auscultation bilaterally. Heart has regular rate and rhythm. No palpable cervical, supraclavicular, or axillary adenopathy. Abdomen soft, non-tender, normal bowel sounds.  On pelvic examination the external genitalia were unremarkable. A speculum exam was performed. There are no mucosal lesions noted in the vaginal vault.  On bimanual examination there were no pelvic masses appreciated.  Vaginal cuff intact.  Mild radiation changes noted at the vaginal cuff.  Lab Findings: Lab Results  Component Value Date   WBC 9.9 11/06/2020   HGB 13.1 11/06/2020   HCT 38.6 11/06/2020   MCV 103.6 (H) 11/06/2020   PLT 280.0 11/06/2020    Radiographic Findings: No results found.  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   No evidence of recurrence on clinical exam.  I congratulated the patient on her  success, pending exam with Dr. Denman George in October.  She will follow-up in radiation oncology on a as needed basis  Plan:  Patient will follow up with Dr. Denman George in October of this year.  Since the patient will then be 5 years out from her treatment she will then proceed with yearly follow-ups with her gynecologist.   Total time spent in this encounter was 20 minutes which included reviewing the patient's most recent follow-up with Dr. Denman George, PAP smear, physical examination, and documentation. ____________________________________  Blair Promise, PhD, MD  This document serves as a record of services personally performed by Gery Pray, MD. It was created on his behalf by Clerance Lav, a trained medical scribe. The creation of this record is based on the scribe's personal observations and the  provider's statements to them. This document has been checked and approved by the attending provider.

## 2020-11-26 NOTE — Progress Notes (Signed)
Jacqueline Hammond is here today for follow up post radiation to the pelvic.  They completed their radiation on: 03/24/16   Does the patient complain of any of the following:  . Pain: Patient denies pain . Abdominal bloating: No  . Diarrhea/Constipation: No . Nausea/Vomiting: no . Vaginal Discharge: no . Blood in Urine or Stool: no . Urinary Issues (dysuria/incomplete emptying/ incontinence/ increased frequency/urgency): Patient reports urgency.  . Does patient report using vaginal dilator 2-3 times a week and/or sexually active 2-3 weeks: patient no longer using dilator.  Marland Kitchen Post radiation skin changes: no    Additional comments if applicable:  Vitals:   40/08/67 1128  BP: (!) 143/86  Pulse: 80  Resp: 20  Temp: (!) 97.5 F (36.4 C)  SpO2: 100%  Weight: 135 lb 6.4 oz (61.4 kg)  Height: 5\' 3"  (1.6 m)

## 2020-11-30 ENCOUNTER — Ambulatory Visit
Admission: RE | Admit: 2020-11-30 | Discharge: 2020-11-30 | Disposition: A | Discharge status: Home / Self Care | Payer: PPO | Source: Ambulatory Visit | Attending: Family Medicine | Admitting: Family Medicine

## 2020-11-30 DIAGNOSIS — I77811 Abdominal aortic ectasia: Secondary | ICD-10-CM | POA: Diagnosis not present

## 2021-04-09 ENCOUNTER — Telehealth: Payer: Self-pay | Admitting: *Deleted

## 2021-04-09 NOTE — Telephone Encounter (Signed)
Called and moved the patient's appt from 9/21 to 9/12

## 2021-04-11 NOTE — Progress Notes (Signed)
Follow-up Note: Gyn-Onc   Jacqueline Hammond 70 y.o. female  Chief Complaint  Patient presents with   Secondary malignant neoplasm of cervix ([place])   Endometrial cancer, FIGO stage IIIC ([place])    Assessment :Stage IIIC1 (with ITC's) serous high grade serous endometrial adenocarcioma. Cervical stromal involvement. s/p adjuvant therapy (chemotherapy and radiation) completed in September, 2017 with no evidence of recurrence.  Plan   She has no significant persistent toxicities to therapy. Post treatment imaging unremarkable.  I discussed symptoms concerning for maligancy and notified her that she should inform us of these should they develop.   I will order CT imaging of the abdomen and pelvis. If these show enduring radiographic complete response, she can suspend routine scheduled surveillance visits at the [facility] as 5 years has elapsed since completing therapy.   HPI: 70 year old white married female gravida 2 seen in consultation request of Dr. Abbie Sons regarding management of a newly diagnosed endometrial carcinoma. The patient initially had some spotting and a Pap smear revealing atypical glandular cells. She underwent sonohysterogram and endometrial biopsy on 10/19/2015 revealing an endometrial adenocarcinoma with serous features (high-grade) the patient is currently not having any bleeding and has no pelvic symptoms. The uterus measured 7.4 x 3.5 x 3.0 cm with an endometrial stripe of 6.8 mm.  She has no significant past gynecologic history although she had a tubal ligation through a Pfannenstiel incision. Subsequently she underwent diagnostic laparoscopy and lysis of adhesions for pelvic pain which is now resolved. She claims her Pap smears the past and been normal. She has no other significant medical history.  On 10/29/15 she underwent a robotic hysterectomy, BSO and sentinel lymph node biopsy of pelvic and PA nodes at [facility] with Dr Clarene Essex. Surgery was uncomplicated. Postoperative  pathology revealed a 63m tumor of high grade serous adenocarcinoma involving the cervical stroma with no LVSI. There was outer half myometrial infiltration (12 of 117m. The left and right pelvic SLN's were positive for ITC's on IHC.  Interval Hx:   She commenced "sandwich" adjuvant therapy with 6 cycles (total) of chemotherapy with carboplatin and paclitaxel completed on 04/21/16, and pelvic external beam radiation (with brachytherapy) completed on 02-23-16.  She did very well during treatment.   She has no symptoms of recurrence.   Review of Systems:10 point review of systems is negative except as noted in interval history.   Vitals: Blood pressure 132/82, pulse 89, temperature 98.1 F (36.7 C), temperature source Oral, resp. rate 18, height '5\' 3"'$  (1.6 m), weight 131 lb 3.2 oz (59.5 kg), SpO2 98 %.  Physical Exam: General : The patient is a healthy woman in no acute distress.  HEENT: normocephalic, extraoccular movements normal; neck is supple without thyromegally  Lynphnodes: Supraclavicular and inguinal nodes not enlarged  Abdomen: Soft, non-tender, no ascites, no organomegally, no masses, no hernias , well-healed laparoscopic incisions. Pelvic:  EGBUS: Normal female  Vagina: vaginal cuff in tact. Shortened but grossly normal and free of disease. Urethra and Bladder: Normal, non-tender Cervix: surgically absent Uterus: surgically absent Bi-manual examination: Non-tender; no masses Rectal: deferred Lower extremities: No edema or varicosities. Normal range of motion      Allergies  Allergen Reactions   Codeine Sulfate     REACTION: nausea, pass out   Neomycin-Bacitracin Zn-Polymyx     REACTION: redness    Past Medical History:  Diagnosis Date   A[place] of [place]rium [place]   Allergy    mild seasonal   Atypical glandular cells of undetermined  significance (AGUS) on cervical Pap smear 09/2015   GERD (gastroesophageal reflux disease)    Heart murmur    as  teenager but not mentioned since   History of chemotherapy    completed nov 2017   History of radiation therapy 01/14/2016-02/23/2016   pelvis 45 Gy in 25 fx   History of radiation therapy 03/08/2016, 03/17/2016, 03/24/2016   vaginal cuff 18 Gy in 3 fx   Hyperlipidemia    Osteopenia 09/2015   T score -1.4 FRAX 8.5%/0.8%    Past Surgical History:  Procedure Laterality Date   COLONOSCOPY     IR GENERIC HISTORICAL  03/02/2016   IR US GUIDE VASC ACCESS RIGHT 03/02/2016 Arne Cleveland, MD [facility]   IR GENERIC HISTORICAL  03/02/2016   IR FLUORO GUIDE CV LINE RIGHT 03/02/2016 Arne Cleveland, MD [facility]   LAPAROSCOPY     POLYPECTOMY     ROBOTIC ASSISTED TOTAL HYSTERECTOMY WITH BILATERAL SALPINGO OOPHERECTOMY  10/29/15   robotic hysterectomy, BSO and sentinel lymph node biopsy of pelvic and PA nodes at [facility] with Dr Clarene Essex.    TUBAL LIGATION      Current Outpatient Medications  Medication Sig Dispense Refill   atorvastatin (LIPITOR) 20 MG tablet Take 1 tablet (20 mg total) by mouth daily. 90 tablet 3   b complex vitamins capsule Take 1 capsule by mouth daily.     Multiple Vitamin (MULTIVITAMIN) tablet Take 1 tablet by mouth daily.     traZODone (DESYREL) 50 MG tablet TAKE 1/2 TO 1 TABLET(25 TO 50 MG) BY MOUTH AT BEDTIME AS NEEDED FOR SLEEP 30 tablet 3   VITAMIN D PO Take by mouth.     No current facility-administered medications for this visit.    Social History   Socioeconomic History   Marital status: Married    Spouse name: Not on file   Number of children: 2   Years of education: Not on file   Highest education level: Not on file  Occupational History   Not on file  Tobacco Use   Smoking status: Some Days    Packs/day: 1.00    Years: 32.00    Pack years: 32.00    Types: Cigarettes   Smokeless tobacco: Never   Tobacco comments:    3 cigarettes/ day as of 06-30-16  Vaping Use   Vaping Use: Never used  Substance and Sexual Activity   Alcohol use: No    Alcohol/week: 0.0  standard drinks   Drug use: No   Sexual activity: Not Currently    Comment: 1st intercourse 57 yo-1 partner  Other Topics Concern   Not on file  Social History Narrative   Married 1970 ([name] is a patient here). 1 living son (1 son died of esophageal cancer- had daughter Marolyn Hammock and they were closed). 3 grandchildren. 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[employer], owned Probation officer)      Hobbies: travel, Animator sports, yardwork, Scientist, research (life sciences), sewing, crafts with grandkids, Psychologist, occupational         Social Determinants of Radio broadcast assistant Strain: Not on [employer] Insecurity: Not on file  Transportation Needs: Not on file  Physical Activity: Not on file  Stress: Not on file  Social Connections: Not on file  Intimate Partner Violence: Not on file    Family History  Problem Relation Age of Onset   Cancer Mother        larynx  Heart failure Father    Heart attack Father 34       smoker, war vet   Melanoma Father    Heart attack Brother 11   Colon polyps Brother    Colon cancer Paternal Grandmother 22   Colon polyps Sister    Esophageal cancer Son    Rectal cancer Neg Hx    Stomach cancer Neg Hx      Thereasa Solo, MD 04/12/2021, 11:16 AM

## 2021-04-12 ENCOUNTER — Other Ambulatory Visit: Payer: Self-pay

## 2021-04-12 ENCOUNTER — Inpatient Hospital Stay: Payer: PPO | Attending: Gynecologic Oncology | Admitting: Gynecologic Oncology

## 2021-04-12 ENCOUNTER — Inpatient Hospital Stay: Payer: PPO

## 2021-04-12 ENCOUNTER — Encounter: Payer: Self-pay | Admitting: Gynecologic Oncology

## 2021-04-12 VITALS — BP 132/82 | HR 89 | Temp 98.1°F | Resp 18 | Ht 63.0 in | Wt 131.2 lb

## 2021-04-12 DIAGNOSIS — C7982 Secondary malignant neoplasm of genital organs: Secondary | ICD-10-CM

## 2021-04-12 DIAGNOSIS — Z90710 Acquired absence of both cervix and uterus: Secondary | ICD-10-CM | POA: Insufficient documentation

## 2021-04-12 DIAGNOSIS — C541 Malignant neoplasm of endometrium: Secondary | ICD-10-CM

## 2021-04-12 DIAGNOSIS — Z9221 Personal history of antineoplastic chemotherapy: Secondary | ICD-10-CM | POA: Insufficient documentation

## 2021-04-12 DIAGNOSIS — Z8542 Personal history of malignant neoplasm of other parts of uterus: Secondary | ICD-10-CM | POA: Diagnosis not present

## 2021-04-12 DIAGNOSIS — Z90722 Acquired absence of ovaries, bilateral: Secondary | ICD-10-CM | POA: Insufficient documentation

## 2021-04-12 DIAGNOSIS — Z923 Personal history of irradiation: Secondary | ICD-10-CM | POA: Diagnosis not present

## 2021-04-12 LAB — BASIC METABOLIC PANEL
Anion gap: 9 (ref 5–15)
BUN: 12 mg/dL (ref 8–23)
CO2: 26 mmol/L (ref 22–32)
Calcium: 10 mg/dL (ref 8.9–10.3)
Chloride: 105 mmol/L (ref 98–111)
Creatinine, Ser: 1.14 mg/dL — ABNORMAL HIGH (ref 0.44–1.00)
GFR, Estimated: 52 mL/min — ABNORMAL LOW (ref >60)
Glucose, Bld: 92 mg/dL (ref 70–99)
Potassium: 4.3 mmol/L (ref 3.5–5.1)
Sodium: 140 mmol/L (ref 135–145)

## 2021-04-12 NOTE — Patient Instructions (Signed)
Congratulations, You have graduated from returning for scheduled follow-up visits at the cancer center. However, if you develop any new concerning symptoms (particularly pelvic symptoms), please contact [phone] and schedule an appointment with one of Dr Serita Grit partners.  Dr Denman George is ordering a CT scan to confirm that there is no evidence of disease prior to discontinuing your surveillance visits.

## 2021-04-16 ENCOUNTER — Ambulatory Visit (HOSPITAL_COMMUNITY)
Admission: RE | Admit: 2021-04-16 | Discharge: 2021-04-16 | Disposition: A | Discharge status: Home / Self Care | Payer: PPO | Source: Ambulatory Visit | Attending: Gynecologic Oncology | Admitting: Gynecologic Oncology

## 2021-04-16 ENCOUNTER — Other Ambulatory Visit: Payer: Self-pay

## 2021-04-16 DIAGNOSIS — I714 Abdominal aortic aneurysm, without rupture: Secondary | ICD-10-CM | POA: Diagnosis not present

## 2021-04-16 DIAGNOSIS — C541 Malignant neoplasm of endometrium: Secondary | ICD-10-CM | POA: Diagnosis not present

## 2021-04-16 MED ORDER — IOHEXOL 350 MG/ML SOLN
80.0000 mL | Freq: Once | INTRAVENOUS | Status: AC | PRN
  Administered 2021-04-16: 75 mL via INTRAVENOUS

## 2021-04-19 ENCOUNTER — Telehealth: Payer: Self-pay

## 2021-04-19 ENCOUNTER — Encounter: Payer: Self-pay | Admitting: Family Medicine

## 2021-04-19 NOTE — Telephone Encounter (Signed)
Spoke with Mrs. Orban and reviewed CT scan results. Patient reviewed saw results on mychart. No evidence of cancer on exam. Patient aware of Abdominal Aortic Aneurysm slightly increased 0.2 cm.  Patient is going to follow up with PCP Dr. Yong Channel.  Patient verbalized understanding and will call with any questions.

## 2021-04-21 ENCOUNTER — Ambulatory Visit: Payer: PPO | Admitting: Gynecologic Oncology

## 2021-05-11 DIAGNOSIS — L821 Other seborrheic keratosis: Secondary | ICD-10-CM | POA: Diagnosis not present

## 2021-05-11 DIAGNOSIS — D485 Neoplasm of uncertain behavior of skin: Secondary | ICD-10-CM | POA: Diagnosis not present

## 2021-05-11 DIAGNOSIS — C44622 Squamous cell carcinoma of skin of right upper limb, including shoulder: Secondary | ICD-10-CM | POA: Diagnosis not present

## 2021-05-18 DIAGNOSIS — C44622 Squamous cell carcinoma of skin of right upper limb, including shoulder: Secondary | ICD-10-CM | POA: Diagnosis not present

## 2021-06-15 ENCOUNTER — Other Ambulatory Visit: Payer: Self-pay | Admitting: Family Medicine

## 2021-07-12 ENCOUNTER — Telehealth: Payer: Self-pay | Admitting: *Deleted

## 2021-07-12 NOTE — Telephone Encounter (Signed)
-----   Message from Kathaleen Grinder sent at 07/12/2021  2:50 PM EST ----- Regarding: Notes Patient wants someone to look over notes from the Gynecology oncology to see if she needs to keep her AEX with TW.

## 2021-07-12 NOTE — Telephone Encounter (Signed)
Tiffany please see the below. It appears on 04/2020 visit she was released by [facility] oncology and she has annual exam scheduled with you on 08/12/2021. Please advise

## 2021-07-13 ENCOUNTER — Other Ambulatory Visit: Payer: Self-pay

## 2021-07-13 ENCOUNTER — Ambulatory Visit: Payer: PPO | Admitting: Family Medicine

## 2021-07-13 ENCOUNTER — Telehealth: Payer: Self-pay | Admitting: Family Medicine

## 2021-07-13 ENCOUNTER — Ambulatory Visit (INDEPENDENT_AMBULATORY_CARE_PROVIDER_SITE_OTHER): Payer: PPO

## 2021-07-13 VITALS — BP 144/90 | HR 103 | Ht 63.0 in | Wt 132.6 lb

## 2021-07-13 DIAGNOSIS — S62337A Displaced fracture of neck of fifth metacarpal bone, left hand, initial encounter for closed fracture: Secondary | ICD-10-CM

## 2021-07-13 DIAGNOSIS — S62617A Displaced fracture of proximal phalanx of left little finger, initial encounter for closed fracture: Secondary | ICD-10-CM

## 2021-07-13 DIAGNOSIS — S62309A Unspecified fracture of unspecified metacarpal bone, initial encounter for closed fracture: Secondary | ICD-10-CM | POA: Insufficient documentation

## 2021-07-13 DIAGNOSIS — M79642 Pain in left hand: Secondary | ICD-10-CM

## 2021-07-13 DIAGNOSIS — S62645A Nondisplaced fracture of proximal phalanx of left ring finger, initial encounter for closed fracture: Secondary | ICD-10-CM | POA: Diagnosis not present

## 2021-07-13 DIAGNOSIS — M7989 Other specified soft tissue disorders: Secondary | ICD-10-CM | POA: Diagnosis not present

## 2021-07-13 DIAGNOSIS — S62609A Fracture of unspecified phalanx of unspecified finger, initial encounter for closed fracture: Secondary | ICD-10-CM | POA: Insufficient documentation

## 2021-07-13 MED ORDER — TETANUS-DIPHTH-ACELL PERTUSSIS 5-2-15.5 LF-MCG/0.5 IM SUSP: 0.5000 mL | Freq: Once | INTRAMUSCULAR | 0 refills | Status: AC

## 2021-07-13 MED ORDER — TRAMADOL HCL 50 MG PO TABS: 50.0000 mg | ORAL_TABLET | Freq: Three times a day (TID) | ORAL | 0 refills | Status: DC | PRN

## 2021-07-13 NOTE — Telephone Encounter (Signed)
She can schedule annual visit in September 2023 at the one year mark since release from [place] if she prefers. Thank you.

## 2021-07-13 NOTE — Telephone Encounter (Signed)
Message sent to [place].

## 2021-07-13 NOTE — Telephone Encounter (Signed)
Tramadol sent.  This is an opiate-based pain medication which should provide better relief.

## 2021-07-13 NOTE — Telephone Encounter (Signed)
Called pt and informed her rx was sent to her pharmacy.

## 2021-07-13 NOTE — Patient Instructions (Addendum)
Thank you for coming in today.   Go to the pharmacy and get the Tdap vaccine.  Let me know if you have a problem with the splint.  Let me know if you need some pain medicine.  Recheck back in 1 week.

## 2021-07-13 NOTE — Telephone Encounter (Signed)
Patient called stating that she has decided she would like something for pain.  Can this be sent to [facility] on [address]?

## 2021-07-13 NOTE — Progress Notes (Signed)
I, Peterson Lombard, LAT, ATC acting as a scribe for Lynne Leader, MD.  Subjective:    CC: L hand pain  HPI: Pt is a 70 y/o female c/o L hand pain x 1 days. Pt was using the leaf blower, while her husband was mowing and was struck in the L hand by a projectile coming out of the lawnmower. Pt locates pain to proximal 5th finger and on the 5th MC. Last Td vaccine April 2011.   L hand swelling: yes Treatments tried: ice, IBU  Pertinent review of Systems: No fevers or chills  Relevant historical information: History of endometrial cancer.   Objective:    Vitals:   07/13/21 1334  BP: (!) 144/90  Pulse: (!) 103  SpO2: 98%   General: Well Developed, well nourished, and in no acute distress.   MSK: Left hand swelling and bruised at ulnar left hand.  Tender palpation proximal fifth finger. Fifth MCP is slightly depressed. Decreased motion. Sensation is intact distally. Abrasion present left hand.  No erythema.  Lab and Radiology Results  X-ray images left hand obtained today personally and independently interpreted. Comminuted transverse fracture proximal fifth proximal phalanx.  Distal end of fracture is angulated dorsally. Patient has a transverse fracture at the distal end of the fifth metacarpal with a distal and angulated volar. Patient has a nondisplaced fracture at the proximal fourth proximal phalanx. Await formal radiology review  Reduction attempt left fifth 5th finger. Obtained and timeout performed. Skin dorsal ulnar hand sterilized with isopropyl alcohol. Cold spray applied. 5 mL of lidocaine injected distal fifth metacarpal either side of the metacarpal head achieving a digital block and moderate anesthesia. Axial traction and dorsal angulation was applied to the fracture with a small pop felt.  Fracture appearance visually was slightly improved.  Fiberglass splint Following reduction attempt a custom formed ulnar gutter fiberglass splint was  applied.   Impression and Recommendations:    Assessment and Plan: 70 y.o. female with left hand fracture. Patient has a displaced fracture at the proximal fifth phalanx, a nondisplaced fracture of the proximal fourth phalanx and a minimally displaced fracture of the distal fifth metacarpal. Patient had a reduction attempt and was treated with a ulnar gutter splint.  Plan to recheck in about 1 week.  We will repeat x-rays then and look visually again.  If not adequately aligned either on x-ray or visually will refer to hand surgery.  Patient would very much like to avoid surgery therefore is willing to tolerate a little bit of nonideal alignment.  Discussed pain control.  She like to try Tylenol and ibuprofen at prescription strength for now.  If not adequate she will let me know and I can prescribe opiates.  Additionally patient is overdue for a Tdap vaccine and does have a hand abrasion.  Unfortunately I do not have a Tdap vaccine in clinic.  Will prescribe Tdap to her pharmacy and she will go get it now.  Recheck in about 1 week.  PDMP not reviewed this encounter. Orders Placed This Encounter  Procedures   DG Hand Complete Left    Standing Status:   Future    Number of Occurrences:   1    Standing Expiration Date:   07/13/2022    Order Specific Question:   Reason for Exam (SYMPTOM  OR DIAGNOSIS REQUIRED)    Answer:   left hand pain    Order Specific Question:   Preferred imaging location?    Answer:   [facility]   Meds ordered this encounter  Medications   Tdap (ADACEL) 11-30-13.5 LF-MCG/0.5 injection    Sig: Inject 0.5 mLs into the muscle once for 1 dose.    Dispense:  0.5 mL    Refill:  0    Discussed warning signs or symptoms. Please see discharge instructions. Patient expresses understanding.   The above documentation has been reviewed and is accurate and complete Lynne Leader, M.D.

## 2021-07-15 ENCOUNTER — Encounter: Payer: Self-pay | Admitting: Family Medicine

## 2021-07-15 NOTE — Progress Notes (Signed)
Left hand x-ray shows no fractures that we discussed in clinic.

## 2021-07-16 NOTE — Progress Notes (Signed)
° °  I, Wendy Poet, LAT, ATC, am serving as scribe for Dr. Lynne Leader.  Jacqueline Hammond is a 70 y.o. female who presents to [place] at [facility] today for f/u of L 5th MC and L 4th and 5th finger fractures that occurred on 07/12/21.  She was last seen by Dr. Georgina Snell on 07/13/21 and was placed into a custom formed fiberglass splint and prescribed Tramadol.  Today, pt reports that her L hand is feeling better except for her L 5th finger which is still quite painful.  She has not been using the Tramadol but is using Advil intermittently.  Diagnostic testing: L hand XR- 07/13/21   Pertinent review of systems: no fever or chills  Relevant historical information: hx endometrial cancer   Exam:  BP 110/70 (BP Location: Right Arm, Patient Position: Sitting, Cuff Size: Normal)    Pulse (!) 119    Ht 5\' 3"  (1.6 m)    Wt 133 lb (60.3 kg)    SpO2 93%    BMI 23.56 kg/m  General: Well Developed, well nourished, and in no acute distress.   MSK: Left hand significant bruising along ulnar aspect of hand and finger. Tender palpation at the fifth metacarpal proximal fifth digit and proximal fourth digit. No scissoring present with hand flexion visible.   Sensation and capillary refill are intact distally.    Lab and Radiology Results  X-ray images left hand obtained todaypersonally and independently interpreted. Fracture at the fifth metacarpal neck slightly displaced volarly not significantly changed. Comminuted fracture at the proximal fifth phalanx normally aligned. Transverse fracture at the base of the fourth proximal phalanx nondisplaced and unchanged. Await radiology review      Assessment and Plan: 70 y.o. female with multiple fractures left hand.  Clinically doing pretty well with reasonably good alignment.  Patient does have slightly volar angled fracture at the fifth metacarpal neck but within limits per my interpretation. Patient was fitted with an [place] boxer's  fracture cast and tolerated it well today.  Recheck in weeks.   PDMP not reviewed this encounter. Orders Placed This Encounter  Procedures   DG Hand Complete Left    Standing Status:   Future    Number of Occurrences:   1    Standing Expiration Date:   08/19/2021    Order Specific Question:   Reason for Exam (SYMPTOM  OR DIAGNOSIS REQUIRED)    Answer:   L hand fracture    Order Specific Question:   Preferred imaging location?    Answer:   [facility]   No orders of the defined types were placed in this encounter.    Discussed warning signs or symptoms. Please see discharge instructions. Patient expresses understanding.   The above documentation has been reviewed and is accurate and complete Lynne Leader, M.D.

## 2021-07-21 ENCOUNTER — Other Ambulatory Visit: Payer: Self-pay

## 2021-07-21 ENCOUNTER — Ambulatory Visit (INDEPENDENT_AMBULATORY_CARE_PROVIDER_SITE_OTHER): Payer: PPO

## 2021-07-21 ENCOUNTER — Ambulatory Visit: Payer: PPO | Admitting: Family Medicine

## 2021-07-21 ENCOUNTER — Encounter: Payer: Self-pay | Admitting: Family Medicine

## 2021-07-21 VITALS — BP 110/70 | HR 119 | Ht 63.0 in | Wt 133.0 lb

## 2021-07-21 DIAGNOSIS — S62337D Displaced fracture of neck of fifth metacarpal bone, left hand, subsequent encounter for fracture with routine healing: Secondary | ICD-10-CM | POA: Diagnosis not present

## 2021-07-21 DIAGNOSIS — S62617D Displaced fracture of proximal phalanx of left little finger, subsequent encounter for fracture with routine healing: Secondary | ICD-10-CM

## 2021-07-21 DIAGNOSIS — S62645D Nondisplaced fracture of proximal phalanx of left ring finger, subsequent encounter for fracture with routine healing: Secondary | ICD-10-CM | POA: Diagnosis not present

## 2021-07-21 DIAGNOSIS — S62307D Unspecified fracture of fifth metacarpal bone, left hand, subsequent encounter for fracture with routine healing: Secondary | ICD-10-CM | POA: Diagnosis not present

## 2021-07-21 NOTE — Patient Instructions (Addendum)
Good to see you.  I've made an Exos splint for you.  Refer to instructions regarding care for splint/brace.  Follow-up: 2 weeks and will plan to repeat X-ray

## 2021-07-22 NOTE — Progress Notes (Signed)
Left hand x-ray shows that the fractures are stable.

## 2021-08-04 ENCOUNTER — Ambulatory Visit (INDEPENDENT_AMBULATORY_CARE_PROVIDER_SITE_OTHER): Payer: PPO

## 2021-08-04 ENCOUNTER — Other Ambulatory Visit: Payer: Self-pay

## 2021-08-04 ENCOUNTER — Encounter: Payer: Self-pay | Admitting: Family Medicine

## 2021-08-04 ENCOUNTER — Ambulatory Visit: Payer: PPO | Admitting: Family Medicine

## 2021-08-04 VITALS — BP 110/80 | HR 102 | Ht 63.0 in | Wt 131.8 lb

## 2021-08-04 DIAGNOSIS — S62337D Displaced fracture of neck of fifth metacarpal bone, left hand, subsequent encounter for fracture with routine healing: Secondary | ICD-10-CM

## 2021-08-04 DIAGNOSIS — S62617D Displaced fracture of proximal phalanx of left little finger, subsequent encounter for fracture with routine healing: Secondary | ICD-10-CM

## 2021-08-04 DIAGNOSIS — S62645D Nondisplaced fracture of proximal phalanx of left ring finger, subsequent encounter for fracture with routine healing: Secondary | ICD-10-CM

## 2021-08-04 DIAGNOSIS — S62307D Unspecified fracture of fifth metacarpal bone, left hand, subsequent encounter for fracture with routine healing: Secondary | ICD-10-CM | POA: Diagnosis not present

## 2021-08-04 NOTE — Patient Instructions (Addendum)
Good to see you today.  Follow-up: 3 weeks and we'll re-Xray your hand at the beginning of your visit.  Happy New Year!

## 2021-08-04 NOTE — Progress Notes (Signed)
° °  I, Wendy Poet, LAT, ATC, am serving as scribe for Dr. Lynne Leader.  Jacqueline Hammond is a 71 y.o. female who presents to [place] at [facility] today for  f/u of L 5th MC and L 4th and 5th finger fractures that occurred on 07/12/21.  She was last seen by Dr. Georgina Snell on 07/21/21 and noted improvement in her symptoms.  She was taken out of the fiberglass splint and fitted w/ and Exos Boxer's fx splint/cast.  Today, pt reports that her L hand and fingers con't to improve.  She states that she is down to "one pain spot" that she locates between her L 4th and 5th MCP joints.  She is no longer taking any medication for pain.  Diagnostic testing: L hand XR- 07/21/21, 07/13/21 Pertinent review of systems: No fevers or chills  Relevant historical information: Prior cancer history   Exam:  BP 110/80 (BP Location: Right Arm, Patient Position: Sitting, Cuff Size: Normal)    Pulse (!) 102    Ht 5\' 3"  (1.6 m)    Wt 131 lb 12.8 oz (59.8 kg)    SpO2 95%    BMI 23.35 kg/m  General: Well Developed, well nourished, and in no acute distress.   MSK: Left hand swelling at fifth digit otherwise normal-appearing Tender palpation along the fifth MCP.  Motion not tested. Pulses capillary fill and sensation are intact distally.    Lab and Radiology Results  X-ray images left hand obtained today personally and independently interpreted. Comminuted fracture fifth proximal phalanx without significant callus formation. Transverse fracture fourth and fifth distal metacarpals.  Fifth metacarpal fracture slightly angulated volar. No significant change in angulation or displacement. Await formal radiology review     Assessment and Plan: 71 y.o. female with left hand fractures.  Doing well without great x-ray healing.  Clinically she is improving.  Plan to continue ulnar casting with Exos cast today.  Recheck in 3 weeks.   PDMP not reviewed this encounter. Orders Placed This Encounter   Procedures   DG Hand Complete Left    Standing Status:   Future    Number of Occurrences:   1    Standing Expiration Date:   09/04/2021    Order Specific Question:   Reason for Exam (SYMPTOM  OR DIAGNOSIS REQUIRED)    Answer:   L hand fracture    Order Specific Question:   Preferred imaging location?    Answer:   [facility]   No orders of the defined types were placed in this encounter.    Discussed warning signs or symptoms. Please see discharge instructions. Patient expresses understanding.   The above documentation has been reviewed and is accurate and complete Lynne Leader, M.D.

## 2021-08-05 NOTE — Progress Notes (Signed)
Fractures are unchanged appearing

## 2021-08-09 DIAGNOSIS — Z1231 Encounter for screening mammogram for malignant neoplasm of breast: Secondary | ICD-10-CM | POA: Diagnosis not present

## 2021-08-12 ENCOUNTER — Ambulatory Visit: Payer: PPO | Admitting: Nurse Practitioner

## 2021-08-18 DIAGNOSIS — R921 Mammographic calcification found on diagnostic imaging of breast: Secondary | ICD-10-CM | POA: Diagnosis not present

## 2021-08-18 DIAGNOSIS — R922 Inconclusive mammogram: Secondary | ICD-10-CM | POA: Diagnosis not present

## 2021-08-18 DIAGNOSIS — R928 Other abnormal and inconclusive findings on diagnostic imaging of breast: Secondary | ICD-10-CM | POA: Diagnosis not present

## 2021-08-18 LAB — HM MAMMOGRAPHY

## 2021-08-24 ENCOUNTER — Encounter: Payer: Self-pay | Admitting: Family Medicine

## 2021-08-24 NOTE — Progress Notes (Signed)
° °  I, Wendy Poet, LAT, ATC, am serving as scribe for Dr. Lynne Leader.  Jacqueline Hammond is a 71 y.o. female who presents to [place] at [facility] today for f/u of L 5th MC and L 4th and 5th finger fractures that occurred on 07/12/21.  She was last seen by Dr. Georgina Snell on 08/04/21 and noted con't improvement and was advised to con't wearing her Exos cast.  Today, pt reports that her L hand con't to improve.  Her main location of pain is at her distal L 5th MC.  She notes that her L hand is stiff.  Diagnostic testing: L hand XR- 08/25/21, 08/04/21,07/21/21, 07/13/21  Pertinent review of systems: no fever or chills  Relevant historical information: History of endometrial cancer   Exam:  BP 110/70 (BP Location: Right Arm, Patient Position: Sitting, Cuff Size: Normal)    Pulse (!) 180    Ht 5\' 3"  (1.6 m)    Wt 130 lb 12.8 oz (59.3 kg)    SpO2 98%    BMI 23.17 kg/m  General: Well Developed, well nourished, and in no acute distress.   MSK: Left hand: Swelling and mild redness at 5th MCP. TTP at this area. Reduced ROM at 5th MCP.  Sensation and pulses and cap refill intact.       Lab and Radiology Results  Xray left hand obtained today personally and independently interpreted Comminuted fracture at proximal phalanx proximal aspect 5th digit without displacement.  Transverse fracture with mild volar displacement distal end of 5th metacarpal. No callus is seen at either fracture.  Stable fx 4th phalanx.  Await formal radiology review.    Assessment and Plan: 71 y.o. female with Hand fracture involving 5th digit and 4th digit. Fracture occurred about 6 weeks ago. There is little sign of bone healing that I can see on xray today. Radiology over-read is still pending. I am not optimistic that this fracture will heal quickly and I think it may be worth her time to have a surgical consultation from a hand surgeon. She will think about it and let me know.  Otherwise continue  immobilization and recheck in 3 weeks.    PDMP not reviewed this encounter. Orders Placed This Encounter  Procedures   DG Hand Complete Left    Standing Status:   Future    Number of Occurrences:   1    Standing Expiration Date:   09/24/2021    Order Specific Question:   Reason for Exam (SYMPTOM  OR DIAGNOSIS REQUIRED)    Answer:   L hand pain    Order Specific Question:   Preferred imaging location?    Answer:   [facility]   No orders of the defined types were placed in this encounter.    Discussed warning signs or symptoms. Please see discharge instructions. Patient expresses understanding.   The above documentation has been reviewed and is accurate and complete Lynne Leader, M.D.

## 2021-08-25 ENCOUNTER — Ambulatory Visit: Payer: PPO | Admitting: Family Medicine

## 2021-08-25 ENCOUNTER — Ambulatory Visit (INDEPENDENT_AMBULATORY_CARE_PROVIDER_SITE_OTHER): Payer: PPO

## 2021-08-25 ENCOUNTER — Encounter: Payer: Self-pay | Admitting: Family Medicine

## 2021-08-25 ENCOUNTER — Other Ambulatory Visit: Payer: Self-pay

## 2021-08-25 VITALS — BP 110/70 | HR 180 | Ht 63.0 in | Wt 130.8 lb

## 2021-08-25 DIAGNOSIS — S62337D Displaced fracture of neck of fifth metacarpal bone, left hand, subsequent encounter for fracture with routine healing: Secondary | ICD-10-CM

## 2021-08-25 DIAGNOSIS — S62645D Nondisplaced fracture of proximal phalanx of left ring finger, subsequent encounter for fracture with routine healing: Secondary | ICD-10-CM

## 2021-08-25 DIAGNOSIS — S62617D Displaced fracture of proximal phalanx of left little finger, subsequent encounter for fracture with routine healing: Secondary | ICD-10-CM

## 2021-08-25 DIAGNOSIS — S62306D Unspecified fracture of fifth metacarpal bone, right hand, subsequent encounter for fracture with routine healing: Secondary | ICD-10-CM | POA: Diagnosis not present

## 2021-08-25 NOTE — Patient Instructions (Addendum)
Good to see you today.  Let me know if you'd like a referral to hand surgery for consultation  Follow-up: 3 weeks

## 2021-08-27 ENCOUNTER — Encounter: Payer: Self-pay | Admitting: Family Medicine

## 2021-08-27 DIAGNOSIS — S62337D Displaced fracture of neck of fifth metacarpal bone, left hand, subsequent encounter for fracture with routine healing: Secondary | ICD-10-CM

## 2021-08-27 DIAGNOSIS — S62617D Displaced fracture of proximal phalanx of left little finger, subsequent encounter for fracture with routine healing: Secondary | ICD-10-CM

## 2021-08-27 DIAGNOSIS — S62645D Nondisplaced fracture of proximal phalanx of left ring finger, subsequent encounter for fracture with routine healing: Secondary | ICD-10-CM

## 2021-08-27 NOTE — Progress Notes (Signed)
The radiologist does not see a lot of bone healing at the hand fractures.  I do think a surgical opinion with a hand surgeon is a good idea.  Let me know what you think.

## 2021-08-31 ENCOUNTER — Encounter: Payer: Self-pay | Admitting: Orthopedic Surgery

## 2021-08-31 ENCOUNTER — Ambulatory Visit: Payer: PPO | Admitting: Orthopedic Surgery

## 2021-08-31 ENCOUNTER — Other Ambulatory Visit: Payer: Self-pay

## 2021-08-31 ENCOUNTER — Ambulatory Visit: Payer: Self-pay

## 2021-08-31 VITALS — BP 127/84 | HR 77 | Ht 63.0 in | Wt 131.0 lb

## 2021-08-31 DIAGNOSIS — S62617A Displaced fracture of proximal phalanx of left little finger, initial encounter for closed fracture: Secondary | ICD-10-CM

## 2021-09-01 ENCOUNTER — Other Ambulatory Visit: Payer: Self-pay | Admitting: Radiology

## 2021-09-01 DIAGNOSIS — N6315 Unspecified lump in the right breast, overlapping quadrants: Secondary | ICD-10-CM | POA: Diagnosis not present

## 2021-09-01 DIAGNOSIS — R921 Mammographic calcification found on diagnostic imaging of breast: Secondary | ICD-10-CM | POA: Diagnosis not present

## 2021-09-02 DIAGNOSIS — D485 Neoplasm of uncertain behavior of skin: Secondary | ICD-10-CM | POA: Diagnosis not present

## 2021-09-02 DIAGNOSIS — Z85828 Personal history of other malignant neoplasm of skin: Secondary | ICD-10-CM | POA: Diagnosis not present

## 2021-09-02 DIAGNOSIS — L570 Actinic keratosis: Secondary | ICD-10-CM | POA: Diagnosis not present

## 2021-09-02 DIAGNOSIS — D2271 Melanocytic nevi of right lower limb, including hip: Secondary | ICD-10-CM | POA: Diagnosis not present

## 2021-09-02 DIAGNOSIS — L821 Other seborrheic keratosis: Secondary | ICD-10-CM | POA: Diagnosis not present

## 2021-09-02 DIAGNOSIS — Z86007 Personal history of in-situ neoplasm of skin: Secondary | ICD-10-CM | POA: Diagnosis not present

## 2021-09-02 DIAGNOSIS — L814 Other melanin hyperpigmentation: Secondary | ICD-10-CM | POA: Diagnosis not present

## 2021-09-02 DIAGNOSIS — D225 Melanocytic nevi of trunk: Secondary | ICD-10-CM | POA: Diagnosis not present

## 2021-09-02 DIAGNOSIS — Z08 Encounter for follow-up examination after completed treatment for malignant neoplasm: Secondary | ICD-10-CM | POA: Diagnosis not present

## 2021-09-08 NOTE — Progress Notes (Signed)
Office Visit Note   Patient: Jacqueline Hammond           Date of Birth: 11/29/1950           MRN: [id number] Visit Date: 08/31/2021              Requested by: Gregor Hams, MD [place],  [place] [address] PCP: Marin Olp, MD   Assessment & Plan: Visit Diagnoses:  1. Closed displaced fracture of proximal phalanx of left little finger, initial encounter     Plan: Patient is now approximately 7 weeks out from her left small and ring finger P1 fractures and 5th metacarpal neck fracture.  She has minimal pain today.  At this point, she can be out of the splint and start working on ROM.  We discussed formal therapy to help regain motion but patient would rather work on this on her own.  Radiographs show interval healing, however, radiographic healing often lags behind clinical healing in metacarpal and phalangeal fractures.  She can follow up as needed or if she wants to start formal therapy to help with ROM.   Follow-Up Instructions: No follow-ups on file.   Orders:  Orders Placed This Encounter  Procedures   XR Hand Complete Left   No orders of the defined types were placed in this encounter.     Procedures: No procedures performed   Clinical Data: No additional findings.   Subjective: Chief Complaint  Patient presents with   Left Hand - Follow-up, Fracture    DOI:07/12/22, pain 2-3/10, + swelling, she was blowing leaves and husband was mowing and a bolt flew from the mower and hit her hand, still sore, fingers are stiff due to splint    This is a 71 yo RHD F who presents for follow up of a left small and ring finger proximal phalanx fracture and 5th metacarpal neck fracture that occurred on 07/12/21.  Her husband was mowing the lawn when the mower ran over a bolt which was shot out and hit her in the left hand.  She has been in a splint since the injury.  Her pain continues to improve.  She still describes finger stiffness. Her swelling is much improved.     Review of Systems   Objective: Vital Signs: BP 127/84 (BP Location: Left Arm, Patient Position: Sitting)    Pulse 77    Ht 5\' 3"  (1.6 m)    Wt 131 lb (59.4 kg)    BMI 23.21 kg/m   Physical Exam Constitutional:      Appearance: Normal appearance.  Cardiovascular:     Rate and Rhythm: Normal rate.     Pulses: Normal pulses.  Pulmonary:     Effort: Pulmonary effort is normal.  Skin:    General: Skin is warm and dry.     Capillary Refill: Capillary refill takes less than 2 seconds.  Neurological:     Mental Status: She is alert.    Left Hand Exam   Tenderness  Left hand tenderness location: Minimal tenderness at small and ring finger proximal phalangeal base.   Other  Erythema: absent Sensation: normal Pulse: present  Comments:  Mild stiffness of the ring and small fingers secondary to immobilization.  No malrotation or pseudoclawing.      Specialty Comments:  No specialty comments available.  Imaging: No results found.   PMFS History: Patient Active Problem List   Diagnosis Date Noted   Finger fracture, left 07/13/2021  Metacarpal bone fracture 07/13/2021   Aortic atherosclerosis ([place]) 05/07/2020   Chemotherapy-induced peripheral neuropathy ([place]) 04/09/2016   Restless legs 12/18/2015   Constipation 11/28/2015   Continuous tobacco abuse 11/13/2015   Macrocytosis without anemia 11/13/2015   Osteopenia determined by x-ray 11/13/2015   Abdominal aortic aneurysm (AAA) 11/13/2015   Hx of adenomatous colonic polyps 11/13/2015   Endometrial cancer, FIGO stage IIIC ([place]) 11/09/2015   Family history of melanoma 10/10/2014   Former smoker 10/10/2014   Insomnia 10/10/2014   Hematuria 12/04/2012   Hyperlipidemia 05/08/2008   Past Medical History:  Diagnosis Date   Adenocarcinoma of endometrium ([place]) 09/2015   Allergy    mild seasonal   Atypical glandular cells of undetermined significance (AGUS) on cervical Pap smear 09/2015   GERD (gastroesophageal reflux  disease)    Heart murmur    as teenager but not mentioned since   History of chemotherapy    completed nov 2017   History of radiation therapy 01/14/2016-02/23/2016   pelvis 45 Gy in 25 fx   History of radiation therapy 03/08/2016, 03/17/2016, 03/24/2016   vaginal cuff 18 Gy in 3 fx   Hyperlipidemia    Osteopenia 09/2015   T score -1.4 FRAX 8.5%/0.8%    Family History  Problem Relation Age of Onset   Cancer Mother        larynx   Heart failure Father    Heart attack Father 38       smoker, war vet   Melanoma Father    Heart attack Brother 53   Colon polyps Brother    Colon cancer Paternal Grandmother 81   Colon polyps Sister    Esophageal cancer Son    Rectal cancer Neg Hx    Stomach cancer Neg Hx     Past Surgical History:  Procedure Laterality Date   COLONOSCOPY     IR GENERIC HISTORICAL  03/02/2016   IR US GUIDE VASC ACCESS RIGHT 03/02/2016 Arne Cleveland, MD [facility]   IR GENERIC HISTORICAL  03/02/2016   IR FLUORO GUIDE CV LINE RIGHT 03/02/2016 Arne Cleveland, MD [facility]   LAPAROSCOPY     POLYPECTOMY     ROBOTIC ASSISTED TOTAL HYSTERECTOMY WITH BILATERAL SALPINGO OOPHERECTOMY  10/29/15   robotic hysterectomy, BSO and sentinel lymph node biopsy of pelvic and PA nodes at [facility] with Dr Clarene Essex.    TUBAL LIGATION     Social History   Occupational History   Not on file  Tobacco Use   Smoking status: Some Days    Packs/day: 1.00    Years: 32.00    Pack years: 32.00    Types: Cigarettes   Smokeless tobacco: Never   Tobacco comments:    3 cigarettes/ day as of 06-30-16  Vaping Use   Vaping Use: Never used  Substance and Sexual Activity   Alcohol use: No    Alcohol/week: 0.0 standard drinks   Drug use: No   Sexual activity: Not Currently    Comment: 1st intercourse 85 yo-1 partner

## 2021-09-10 ENCOUNTER — Telehealth: Payer: Self-pay | Admitting: Family Medicine

## 2021-09-10 NOTE — Telephone Encounter (Signed)
Patient called stating that Dr Georgina Snell referred her to a surgeon because it did not seem to healing as quickly as he hoped (based on her 08/25/2021 visit with Dr Georgina Snell). She went to see Dr Tempie Donning 08/31/2021 and was told that at this point she could be out of the splint and start working on ROM. She was a little confused being that to [place], it appeared it wasn't healing and to them it was good enough to not wear her splint. She said that she still has trouble typing, etc using that hand.   Please advise.

## 2021-09-10 NOTE — Telephone Encounter (Signed)
I called and left a message on her phone. I agree with Dr. Tempie Donning.  I think clinically she is sounds like she is doing okay and she has a follow-up appointment with Dr. Tempie Donning on 28 February. I left some more detailed explanation about why my recommendations were different than his and how that I thought that actually makes sense. However if she has any questions or concerns I am happy to see her or talk to her.  She can call me back.

## 2021-09-14 ENCOUNTER — Ambulatory Visit: Payer: PPO | Admitting: Family Medicine

## 2021-09-28 ENCOUNTER — Other Ambulatory Visit: Payer: Self-pay

## 2021-09-28 ENCOUNTER — Ambulatory Visit: Payer: PPO | Admitting: Orthopedic Surgery

## 2021-09-28 ENCOUNTER — Ambulatory Visit (INDEPENDENT_AMBULATORY_CARE_PROVIDER_SITE_OTHER): Payer: PPO

## 2021-09-28 ENCOUNTER — Encounter: Payer: Self-pay | Admitting: Orthopedic Surgery

## 2021-09-28 VITALS — BP 104/71 | HR 87

## 2021-09-28 DIAGNOSIS — S62617A Displaced fracture of proximal phalanx of left little finger, initial encounter for closed fracture: Secondary | ICD-10-CM

## 2021-09-28 DIAGNOSIS — L570 Actinic keratosis: Secondary | ICD-10-CM | POA: Diagnosis not present

## 2021-09-28 NOTE — Progress Notes (Signed)
Office Visit Note   Patient: Jacqueline Hammond           Date of Birth: 11-15-1950           MRN: [id number] Visit Date: 09/28/2021              Requested by: Marin Olp, MD [place],  [place] [id number] PCP: Marin Olp, MD   Assessment & Plan: Visit Diagnoses:  1. Closed displaced fracture of proximal phalanx of left little finger, initial encounter     Plan: Discussed with patient that today's x-rays show continued healing of the fracture.  She has no pain today.  She does have some mild malrotation of the small finger relative to the ring finger.  This does not seem to be a problem for her functionally.  Discussed that correction of this wound involve a metacarpal osteotomy with fixation and prolonged postop recovery and rehab.  She is not interested in pursuing this as she has no problems with her hand currently.  We also discussed hand therapy to work on her ROM but she would rather work on this on her own.  I can see her back as needed.   Follow-Up Instructions: No follow-ups on file.   Orders:  Orders Placed This Encounter  Procedures   XR Finger Little Left   No orders of the defined types were placed in this encounter.     Procedures: No procedures performed   Clinical Data: No additional findings.   Subjective: Chief Complaint  Patient presents with   Left Little Finger - Follow-up    This is a 71 yo RHD F who presents for follow up of a left small and ring finger proximal phalanx fracture and 5th metacarpal neck fracture that occurred on 07/12/21.  Her husband was mowing the lawn when the mower ran over a bolt which was shot out and hit her in the left hand.  She is now approximately 10 weeks out from her injury.  She has no pain.  She demonstrate mild malrotation of the small finger relative to the ring finger but this does not bother her. She is happy to be able to do more tasks around the house.    Review of  Systems   Objective: Vital Signs: BP 104/71 (BP Location: Left Arm, Patient Position: Sitting, Cuff Size: Large)    Pulse 87    SpO2 96%   Physical Exam Constitutional:      Appearance: Normal appearance.  Cardiovascular:     Rate and Rhythm: Normal rate.     Pulses: Normal pulses.  Pulmonary:     Effort: Pulmonary effort is normal.  Skin:    General: Skin is warm and dry.     Capillary Refill: Capillary refill takes less than 2 seconds.  Neurological:     Mental Status: She is alert.    Left Hand Exam   Tenderness  The patient is experiencing no tenderness.   Other  Erythema: absent Sensation: normal Pulse: present  Comments:  Mild malrotation of the small finger relative to the ring finger with crossover.  Limited ROM of small and ring fingers secondary to stiffness.      Specialty Comments:  No specialty comments available.  Imaging: No results found.   PMFS History: Patient Active Problem List   Diagnosis Date Noted   Finger fracture, left 07/13/2021   Metacarpal bone fracture 07/13/2021   Aortic atherosclerosis ([place]) 05/07/2020   Chemotherapy-induced peripheral  neuropathy ([place]) 04/09/2016   Restless legs 12/18/2015   Constipation 11/28/2015   Continuous tobacco abuse 11/13/2015   Macrocytosis without anemia 11/13/2015   Osteopenia determined by x-ray 11/13/2015   Abdominal aortic aneurysm (AAA) 11/13/2015   Hx of adenomatous colonic polyps 11/13/2015   Endometrial cancer, FIGO stage IIIC ([place]) 11/09/2015   Family history of melanoma 10/10/2014   Former smoker 10/10/2014   Insomnia 10/10/2014   Hematuria 12/04/2012   Hyperlipidemia 05/08/2008   Past Medical History:  Diagnosis Date   Adenocarcinoma of endometrium ([place]) 09/2015   Allergy    mild seasonal   Atypical glandular cells of undetermined significance (AGUS) on cervical Pap smear 09/2015   GERD (gastroesophageal reflux disease)    Heart murmur    as teenager but not mentioned since    History of chemotherapy    completed nov 2017   History of radiation therapy 01/14/2016-02/23/2016   pelvis 45 Gy in 25 fx   History of radiation therapy 03/08/2016, 03/17/2016, 03/24/2016   vaginal cuff 18 Gy in 3 fx   Hyperlipidemia    Osteopenia 09/2015   T score -1.4 FRAX 8.5%/0.8%    Family History  Problem Relation Age of Onset   Cancer Mother        larynx   Heart failure Father    Heart attack Father 57       smoker, war vet   Melanoma Father    Heart attack Brother 24   Colon polyps Brother    Colon cancer Paternal Grandmother 26   Colon polyps Sister    Esophageal cancer Son    Rectal cancer Neg Hx    Stomach cancer Neg Hx     Past Surgical History:  Procedure Laterality Date   COLONOSCOPY     IR GENERIC HISTORICAL  03/02/2016   IR US GUIDE VASC ACCESS RIGHT 03/02/2016 Arne Cleveland, MD [facility]   IR GENERIC HISTORICAL  03/02/2016   IR FLUORO GUIDE CV LINE RIGHT 03/02/2016 Arne Cleveland, MD [facility]   LAPAROSCOPY     POLYPECTOMY     ROBOTIC ASSISTED TOTAL HYSTERECTOMY WITH BILATERAL SALPINGO OOPHERECTOMY  10/29/15   robotic hysterectomy, BSO and sentinel lymph node biopsy of pelvic and PA nodes at [facility] with Dr Clarene Essex.    TUBAL LIGATION     Social History   Occupational History   Not on file  Tobacco Use   Smoking status: Some Days    Packs/day: 1.00    Years: 32.00    Pack years: 32.00    Types: Cigarettes   Smokeless tobacco: Never   Tobacco comments:    3 cigarettes/ day as of 06-30-16  Vaping Use   Vaping Use: Never used  Substance and Sexual Activity   Alcohol use: No    Alcohol/week: 0.0 standard drinks   Drug use: No   Sexual activity: Not Currently    Comment: 1st intercourse 52 yo-1 partner

## 2021-10-26 DIAGNOSIS — D485 Neoplasm of uncertain behavior of skin: Secondary | ICD-10-CM | POA: Diagnosis not present

## 2021-10-26 DIAGNOSIS — C44722 Squamous cell carcinoma of skin of right lower limb, including hip: Secondary | ICD-10-CM | POA: Diagnosis not present

## 2021-11-11 ENCOUNTER — Encounter: Payer: PPO | Admitting: Family Medicine

## 2021-11-12 DIAGNOSIS — C44722 Squamous cell carcinoma of skin of right lower limb, including hip: Secondary | ICD-10-CM | POA: Diagnosis not present

## 2021-12-09 ENCOUNTER — Encounter: Payer: Self-pay | Admitting: Family Medicine

## 2021-12-09 ENCOUNTER — Ambulatory Visit (INDEPENDENT_AMBULATORY_CARE_PROVIDER_SITE_OTHER): Payer: PPO | Admitting: Family Medicine

## 2021-12-09 VITALS — BP 104/80 | HR 97 | Temp 97.7°F | Ht 63.0 in | Wt 130.2 lb

## 2021-12-09 DIAGNOSIS — Z0000 Encounter for general adult medical examination without abnormal findings: Secondary | ICD-10-CM | POA: Diagnosis not present

## 2021-12-09 DIAGNOSIS — I714 Abdominal aortic aneurysm, without rupture, unspecified: Secondary | ICD-10-CM | POA: Diagnosis not present

## 2021-12-09 DIAGNOSIS — G620 Drug-induced polyneuropathy: Secondary | ICD-10-CM

## 2021-12-09 DIAGNOSIS — T451X5A Adverse effect of antineoplastic and immunosuppressive drugs, initial encounter: Secondary | ICD-10-CM | POA: Diagnosis not present

## 2021-12-09 DIAGNOSIS — R319 Hematuria, unspecified: Secondary | ICD-10-CM

## 2021-12-09 DIAGNOSIS — F17200 Nicotine dependence, unspecified, uncomplicated: Secondary | ICD-10-CM

## 2021-12-09 DIAGNOSIS — M8580 Other specified disorders of bone density and structure, unspecified site: Secondary | ICD-10-CM | POA: Diagnosis not present

## 2021-12-09 DIAGNOSIS — I700 Atherosclerosis of aorta: Secondary | ICD-10-CM | POA: Diagnosis not present

## 2021-12-09 DIAGNOSIS — E785 Hyperlipidemia, unspecified: Secondary | ICD-10-CM

## 2021-12-09 LAB — CBC WITH DIFFERENTIAL/PLATELET
Basophils Absolute: 0.1 10*3/uL (ref 0.0–0.1)
Basophils Relative: 0.9 % (ref 0.0–3.0)
Eosinophils Absolute: 0.1 10*3/uL (ref 0.0–0.7)
Eosinophils Relative: 1.1 % (ref 0.0–5.0)
HCT: 40.5 % (ref 36.0–46.0)
Hemoglobin: 13.6 g/dL (ref 12.0–15.0)
Lymphocytes Relative: 23.8 % (ref 12.0–46.0)
Lymphs Abs: 1.7 10*3/uL (ref 0.7–4.0)
MCHC: 33.6 g/dL (ref 30.0–36.0)
MCV: 106.1 fl — ABNORMAL HIGH (ref 78.0–100.0)
Monocytes Absolute: 0.4 10*3/uL (ref 0.1–1.0)
Monocytes Relative: 6.1 % (ref 3.0–12.0)
Neutro Abs: 4.8 10*3/uL (ref 1.4–7.7)
Neutrophils Relative %: 68.1 % (ref 43.0–77.0)
Platelets: 234 10*3/uL (ref 150.0–400.0)
RBC: 3.81 Mil/uL — ABNORMAL LOW (ref 3.87–5.11)
RDW: 13.5 % (ref 11.5–15.5)
WBC: 7 10*3/uL (ref 4.0–10.5)

## 2021-12-09 LAB — LIPID PANEL
Cholesterol: 178 mg/dL (ref 0–200)
HDL: 57.8 mg/dL (ref >39.00)
LDL Cholesterol: 96 mg/dL (ref 0–99)
NonHDL: 119.9
Total CHOL/HDL Ratio: 3
Triglycerides: 118 mg/dL (ref 0.0–149.0)
VLDL: 23.6 mg/dL (ref 0.0–40.0)

## 2021-12-09 LAB — COMPREHENSIVE METABOLIC PANEL
ALT: 26 U/L (ref 0–35)
AST: 24 U/L (ref 0–37)
Albumin: 4.4 g/dL (ref 3.5–5.2)
Alkaline Phosphatase: 87 U/L (ref 39–117)
BUN: 16 mg/dL (ref 6–23)
CO2: 25 mEq/L (ref 19–32)
Calcium: 9.9 mg/dL (ref 8.4–10.5)
Chloride: 104 mEq/L (ref 96–112)
Creatinine, Ser: 1.03 mg/dL (ref 0.40–1.20)
GFR: 55.34 mL/min — ABNORMAL LOW (ref >60.00)
Glucose, Bld: 95 mg/dL (ref 70–99)
Potassium: 4 mEq/L (ref 3.5–5.1)
Sodium: 138 mEq/L (ref 135–145)
Total Bilirubin: 0.5 mg/dL (ref 0.2–1.2)
Total Protein: 7.2 g/dL (ref 6.0–8.3)

## 2021-12-09 LAB — POC URINALSYSI DIPSTICK (AUTOMATED)
Bilirubin, UA: NEGATIVE
Glucose, UA: NEGATIVE
Ketones, UA: NEGATIVE
Nitrite, UA: POSITIVE
Protein, UA: POSITIVE — AB
Spec Grav, UA: >=1.03 — AB (ref 1.010–1.025)
Urobilinogen, UA: 1 E.U./dL
pH, UA: 5.5 (ref 5.0–8.0)

## 2021-12-09 NOTE — Progress Notes (Signed)
?Phone [phone] ?  ?Subjective:  ?Patient presents today for their annual physical. Chief complaint-noted.  ? ?See problem oriented charting- ?ROS- full  review of systems was completed and negative ?except for: chills, congestion, post nasal drip and rhinorrhea, sneezing - working in the yard a lot ? ?The following were reviewed and entered/updated in epic: ?Past Medical History:  ?Diagnosis Date  ? Adenocarcinoma of endometrium ([place]) 09/2015  ? Allergy   ? mild seasonal  ? Atypical glandular cells of undetermined significance (AGUS) on cervical Pap smear 09/2015  ? GERD (gastroesophageal reflux disease)   ? Heart murmur   ? as teenager but not mentioned since  ? History of chemotherapy   ? completed nov 2017  ? History of radiation therapy 01/14/2016-02/23/2016  ? pelvis 45 Gy in 25 fx  ? History of radiation therapy 03/08/2016, 03/17/2016, 03/24/2016  ? vaginal cuff 18 Gy in 3 fx  ? Hyperlipidemia   ? Osteopenia 09/2015  ? T score -1.4 FRAX 8.5%/0.8%  ? ?Patient Active Problem List  ? Diagnosis Date Noted  ? Chemotherapy-induced peripheral neuropathy ([place]) 04/09/2016  ?  Priority: High  ? Continuous tobacco abuse 11/13/2015  ?  Priority: High  ? History of endometrial cancer. FIGO Stage IIIC1 (T2N1,[place]) 11/09/2015  ?  Priority: High  ? Aortic atherosclerosis ([place]) 05/07/2020  ?  Priority: Medium   ? Osteopenia determined by x-ray 11/13/2015  ?  Priority: Medium   ? Abdominal aortic aneurysm (AAA) ([place]) 11/13/2015  ?  Priority: Medium   ? Hx of adenomatous colonic polyps 11/13/2015  ?  Priority: Medium   ? Hematuria 12/04/2012  ?  Priority: Medium   ? Hyperlipidemia 05/08/2008  ?  Priority: Medium   ? Family history of melanoma 10/10/2014  ?  Priority: Low  ? Former smoker 10/10/2014  ?  Priority: Low  ? Insomnia 10/10/2014  ?  Priority: Low  ? Finger fracture, left 07/13/2021  ? Metacarpal bone fracture 07/13/2021  ? Restless legs 12/18/2015  ? Constipation 11/28/2015  ? Macrocytosis without anemia 11/13/2015  ? ?Past  Surgical History:  ?Procedure Laterality Date  ? COLONOSCOPY    ? IR GENERIC HISTORICAL  03/02/2016  ? IR US GUIDE VASC ACCESS RIGHT 03/02/2016 Arne Cleveland, MD [facility]  ? IR GENERIC HISTORICAL  03/02/2016  ? IR FLUORO GUIDE CV LINE RIGHT 03/02/2016 Arne Cleveland, MD [facility]  ? LAPAROSCOPY    ? POLYPECTOMY    ? ROBOTIC ASSISTED TOTAL HYSTERECTOMY WITH BILATERAL SALPINGO OOPHERECTOMY  10/29/15  ? robotic hysterectomy, BSO and sentinel lymph node biopsy of pelvic and PA nodes at [facility] with Dr Clarene Essex.   ? TUBAL LIGATION    ? ? ?Family History  ?Problem Relation Age of Onset  ? Cancer Mother   ?     larynx  ? Heart failure Father   ? Heart attack Father 74  ?     smoker, war vet  ? Melanoma Father   ? Heart attack Brother 30  ? Colon polyps Brother   ? Colon cancer Paternal Grandmother 39  ? Colon polyps Sister   ? Esophageal cancer Son   ? Rectal cancer Neg Hx   ? Stomach cancer Neg Hx   ? ? ?Medications- reviewed and updated ?Current Outpatient Medications  ?Medication Sig Dispense Refill  ? atorvastatin (LIPITOR) 20 MG tablet TAKE 1 TABLET(20 MG) BY MOUTH DAILY 90 tablet 3  ? b complex vitamins capsule Take 1 capsule by mouth daily.    ?  Multiple Vitamin (MULTIVITAMIN) tablet Take 1 tablet by mouth daily.    ? VITAMIN D PO Take by mouth.    ? ?No current facility-administered medications for this visit.  ? ? ?Allergies-reviewed and updated ?Allergies  ?Allergen Reactions  ? Codeine Sulfate   ?  REACTION: nausea, pass out  ? Neomycin-Bacitracin Zn-Polymyx   ?  REACTION: redness  ? ? ?Social History  ? ?Social History Narrative  ? Married 1970 ([name] is a patient here). 1 living son (1 son died of esophageal cancer- had daughter Marolyn Hammock and they were closed). 3 grandchildren. Granddaughter in [place] and rest [place] area  ?   ? 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   ? Hobbies: travel, spectator sports, yardwork, Scientist, research (life sciences), sewing, crafts with grandkids, volunteer  ?   ?    ? ?Objective  ?Objective:  ?BP 104/80   Pulse 97   Temp 97.7 ?F (36.5 ?C)   Ht '5\' 3"'$  (1.6 m)   Wt 130 lb 3.2 oz (59.1 kg)   SpO2 97%   BMI 23.06 kg/m?  ?Gen: NAD, resting comfortably ?HEENT: Mucous membranes are moist. Oropharynx normal ?Neck: no thyromegaly ?CV: RRR no murmurs rubs or gallops ?Lungs: CTAB no crackles, wheeze, rhonchi ?Abdomen: soft/nontender/nondistended/normal bowel sounds. No rebound or guarding.  ?Ext: no edema ?Skin: warm, dry ?Neuro: grossly normal, moves all extremities, PERRLA ?  ?Assessment and Plan  ? ?71 y.o. female presenting for annual physical.  ?Health Maintenance counseling: ?1. Anticipatory guidance: Patient counseled regarding regular dental exams --no as wears dentures, eye exams - yearly mostly,  avoiding smoking and second hand smoke ideally- usually 3-4 per day , limiting alcohol to 1 beverage per day-does not drink, no illicit drugs.   ?2. Risk factor reduction:  Advised patient of need for regular exercise and diet rich and fruits and vegetables to reduce risk of heart attack and stroke.  ?Exercise-last year had hoped for more consistent walking-hadnt felt well with her hand issues- hoping to start now.  ?Diet/weight management-reasonably healthy.  ?Wt Readings from Last 3 Encounters:  ?12/09/21 130 lb 3.2 oz (59.1 kg)  ?08/31/21 131 lb (59.4 kg)  ?08/25/21 130 lb 12.8 oz (59.3 kg)  ?3. Immunizations/screenings/ancillary studies-can get Shingrix at pharmacy as well as COVID bivalent vaccination if she chooses- is considering  ?Immunization History  ?Administered Date(s) Administered  ? Fluad Quad(high Dose 65+) 10/23/2019, 05/07/2020  ? Influenza Split 04/12/2012  ? Influenza, High Dose Seasonal PF 10/09/2018  ? Influenza,inj,Quad PF,6+ Mos 07/14/2015, 06/30/2016  ? Influenza-Unspecified 08/15/2014  ? PFIZER(Purple Top)SARS-COV-2 Vaccination 09/09/2019, 10/04/2019, 06/10/2020  ? Pneumococcal Conjugate-13 11/07/2017  ? Pneumococcal Polysaccharide-23 12/31/2018  ? Td  11/12/2009  ? Tdap 07/13/2021  ? Zoster, Live 12/04/2012  ?4. Cervical cancer screening- prior to last year had Pap of vaginal cuff and was advised to not have to repeat in the future by Dr. Delilah Shan. Dr. Denman George did one in 2022 as well.  ?5. Breast cancer screening-  breast exam with GYN and mammogram-08/18/21 and ended up with biopsy but benign ?6. Colon cancer screening - 02/20/2017 with 5-year repeat planned due to polyp history-will be due later this year  ?7. Skin cancer screening-family history of melanoma-follows with Dr. Allyson Sabal yearly- recent removal squamous cell Dr. Jonnie Kind got her into Dr. Winifred Olive Mohs- had good experience- witching over for long term care. advised regular sunscreen use. Denies worrisome, changing, or new skin lesions.  ?8. Birth control/STD check- postmenopausal and surgery, only active with  husband ?9. Osteoporosis screening at 14- osteopenia noted but patient on calcium vitamin D-most recent scan 09/15/2020 with gynecology continue current medication and weightbearing exercise worst t score -2.3 ?10. Smoking associated screening -  smoker-last year was at 3 to 4 cigarettes/day and strongly encouraged cessation-encouraged again the same today-not ready to quit - she wants to continue to gradually cut back ? ?Status of chronic or acute concerns  ? ?#Social update--healed from fracture in left 5th finger ? ?% History of endometrial cancer with metastasis-history of robotic surgery, chemotherapy and radiation-followed with Dr. Denman George ? ?#Chemotherapy-induced neuropathy ?S: Started after chemotherapy.  She has not desired treatment ?A/P: overall stable not on meds- wants to monitor  ?  ?#hyperlipidemia ?#Aortic atherosclerosis ?S: Medication: Atorvastatin 20 mg with over 50% improvement in LDL. ?-Family history of MI in 80s and brother and father ?-Technically with LDL goal under 70 but has had myalgias in the past so we have been cautious to increase ?Lab Results  ?Component Value Date  ? CHOL  286 (H) 05/07/2020  ? HDL 53 05/07/2020  ? LDLCALC 193 (H) 05/07/2020  ? LDLDIRECT 88.0 11/06/2020  ? TRIG 210 (H) 05/07/2020  ? CHOLHDL 5.4 (H) 05/07/2020  ?A/P: update lipids and she is going to think over i

## 2021-12-09 NOTE — Patient Instructions (Addendum)
Please check with your pharmacy to see if they have the shingrix vaccine. If they do- please get this immunization and update [place] by phone call or mychart with dates you receive the vaccine ? ?We will call you within two weeks about your referral to lung cancer screening program. If you do not hear within 2 weeks, give [place] a call.   ? ?Please stop by lab before you go ?If you have mychart- we will send your results within 3 business days of [place] receiving them.  ?If you do not have mychart- we will call you about results within 5 business days of [place] receiving them.  ?*please also note that you will see labs on mychart as soon as they post. I will later go in and write notes on them- will say "notes from Dr. Yong Channel"  ? ?Recommended follow up: Return in about 1 year (around 12/10/2022) for physical or sooner if needed.Schedule b4 you leave. ?

## 2021-12-09 NOTE — Addendum Note (Signed)
Addended by: Doran Clay A on: 12/09/2021 01:52 PM ? ? Modules accepted: Orders ? ?

## 2021-12-10 DIAGNOSIS — R319 Hematuria, unspecified: Secondary | ICD-10-CM | POA: Diagnosis not present

## 2021-12-12 LAB — URINE CULTURE
MICRO NUMBER:: 13388957
SPECIMEN QUALITY:: ADEQUATE

## 2021-12-13 ENCOUNTER — Other Ambulatory Visit: Payer: Self-pay | Admitting: Family Medicine

## 2021-12-13 MED ORDER — CEPHALEXIN 500 MG PO CAPS: 500.0000 mg | ORAL_CAPSULE | Freq: Three times a day (TID) | ORAL | 0 refills | Status: AC

## 2022-01-27 DIAGNOSIS — D485 Neoplasm of uncertain behavior of skin: Secondary | ICD-10-CM | POA: Diagnosis not present

## 2022-01-27 DIAGNOSIS — C44722 Squamous cell carcinoma of skin of right lower limb, including hip: Secondary | ICD-10-CM | POA: Diagnosis not present

## 2022-02-08 DIAGNOSIS — C44722 Squamous cell carcinoma of skin of right lower limb, including hip: Secondary | ICD-10-CM | POA: Diagnosis not present

## 2022-02-15 DIAGNOSIS — C44722 Squamous cell carcinoma of skin of right lower limb, including hip: Secondary | ICD-10-CM | POA: Diagnosis not present

## 2022-02-17 ENCOUNTER — Encounter: Payer: Self-pay | Admitting: Internal Medicine

## 2022-02-22 DIAGNOSIS — C44722 Squamous cell carcinoma of skin of right lower limb, including hip: Secondary | ICD-10-CM | POA: Diagnosis not present

## 2022-02-24 ENCOUNTER — Encounter: Payer: Self-pay | Admitting: Internal Medicine

## 2022-03-01 DIAGNOSIS — L538 Other specified erythematous conditions: Secondary | ICD-10-CM | POA: Diagnosis not present

## 2022-03-01 DIAGNOSIS — Z789 Other specified health status: Secondary | ICD-10-CM | POA: Diagnosis not present

## 2022-03-01 DIAGNOSIS — L820 Inflamed seborrheic keratosis: Secondary | ICD-10-CM | POA: Diagnosis not present

## 2022-03-01 DIAGNOSIS — D1801 Hemangioma of skin and subcutaneous tissue: Secondary | ICD-10-CM | POA: Diagnosis not present

## 2022-03-01 DIAGNOSIS — Z85828 Personal history of other malignant neoplasm of skin: Secondary | ICD-10-CM | POA: Diagnosis not present

## 2022-03-01 DIAGNOSIS — Z872 Personal history of diseases of the skin and subcutaneous tissue: Secondary | ICD-10-CM | POA: Diagnosis not present

## 2022-03-01 DIAGNOSIS — D485 Neoplasm of uncertain behavior of skin: Secondary | ICD-10-CM | POA: Diagnosis not present

## 2022-03-01 DIAGNOSIS — C44329 Squamous cell carcinoma of skin of other parts of face: Secondary | ICD-10-CM | POA: Diagnosis not present

## 2022-03-01 DIAGNOSIS — L814 Other melanin hyperpigmentation: Secondary | ICD-10-CM | POA: Diagnosis not present

## 2022-03-01 DIAGNOSIS — L298 Other pruritus: Secondary | ICD-10-CM | POA: Diagnosis not present

## 2022-03-01 DIAGNOSIS — L821 Other seborrheic keratosis: Secondary | ICD-10-CM | POA: Diagnosis not present

## 2022-03-01 DIAGNOSIS — Z08 Encounter for follow-up examination after completed treatment for malignant neoplasm: Secondary | ICD-10-CM | POA: Diagnosis not present

## 2022-03-11 DIAGNOSIS — D0439 Carcinoma in situ of skin of other parts of face: Secondary | ICD-10-CM | POA: Diagnosis not present

## 2022-03-17 ENCOUNTER — Encounter: Payer: Self-pay | Admitting: Oncology

## 2022-04-07 ENCOUNTER — Encounter: Payer: PPO | Admitting: Internal Medicine

## 2022-04-11 DIAGNOSIS — C44722 Squamous cell carcinoma of skin of right lower limb, including hip: Secondary | ICD-10-CM | POA: Diagnosis not present

## 2022-04-11 DIAGNOSIS — D485 Neoplasm of uncertain behavior of skin: Secondary | ICD-10-CM | POA: Diagnosis not present

## 2022-04-12 ENCOUNTER — Encounter: Payer: Self-pay | Admitting: Nurse Practitioner

## 2022-04-12 ENCOUNTER — Ambulatory Visit (INDEPENDENT_AMBULATORY_CARE_PROVIDER_SITE_OTHER): Payer: PPO | Admitting: Nurse Practitioner

## 2022-04-12 VITALS — BP 122/72 | HR 93 | Ht 63.0 in | Wt 133.0 lb

## 2022-04-12 DIAGNOSIS — Z01419 Encounter for gynecological examination (general) (routine) without abnormal findings: Secondary | ICD-10-CM | POA: Diagnosis not present

## 2022-04-12 DIAGNOSIS — Z780 Asymptomatic menopausal state: Secondary | ICD-10-CM

## 2022-04-12 DIAGNOSIS — Z8542 Personal history of malignant neoplasm of other parts of uterus: Secondary | ICD-10-CM

## 2022-04-12 DIAGNOSIS — M8589 Other specified disorders of bone density and structure, multiple sites: Secondary | ICD-10-CM

## 2022-04-12 NOTE — Progress Notes (Signed)
Jacqueline Hammond [birth year] [id number]   History:  71 y.o. G2P0001 presents for breast and pelvic exam. Postmenopausal - no HRT. 2017 endometrial cancer stage IIIC1, managed with robotic hysterectomy with BSO, chemo and radiation. Saw Gyn oncology x 5 years. Normal pap of vaginal cuff 08/2020. H/O skin cancer.   Gynecologic History No LMP recorded. Patient is postmenopausal.   Contraception: status post hysterectomy Sexually active: Yes  Health Maintenance Last Pap (vaginal): 08/11/2020. Results were: Normal Last mammogram: 08/06/2020. Results were: Benign right breast biopsy per patient Last colonoscopy: 02/20/2017. Results were: Tubular adenomas, 5-year recall Last Dexa: 09/15/2020. Results were: T-score-2.3, FRAX 13% / 2.8%  Past medical history, past surgical history, family history and social history were all reviewed and documented in the EPIC chart. Married. Retired. 1 daughter, 3 granddaughters. Son passed a few years ago.   ROS:  A ROS was performed and pertinent positives and negatives are included.  Exam:  Vitals:   04/12/22 0927  BP: 122/72  Pulse: 93  SpO2: 95%  Weight: 133 lb (60.3 kg)  Height: '5\' 3"'$  (1.6 m)   Body mass index is 23.56 kg/m.  General appearance:  Normal Thyroid:  Symmetrical, normal in size, without palpable masses or nodularity. Respiratory  Auscultation:  Clear without wheezing or rhonchi Cardiovascular  Auscultation:  Regular rate, without rubs, murmurs or gallops  Edema/varicosities:  Not grossly evident Abdominal  Soft,nontender, without masses, guarding or rebound.  Liver/spleen:  No organomegaly noted  Hernia:  None appreciated  Skin  Inspection:  Grossly normal Breasts: Examined lying and sitting.   Right: Without masses, retractions, nipple discharge or axillary adenopathy.   Left: Without masses, retractions, nipple discharge or axillary adenopathy. Genitourinary   Inguinal/mons:  Normal without inguinal adenopathy  External  genitalia:  Normal appearing vulva with no masses, tenderness, or lesions  BUS/Urethra/Skene's glands:  Normal  Vagina:  Normal appearing with normal color and discharge, no lesions. Atrophic changes  Cervix:  and uterus absent  Adnexa/parametria:     Rt: Normal in size, without masses or tenderness.   Lt: Normal in size, without masses or tenderness.  Anus and perineum: Normal  Digital rectal exam: Normal sphincter tone without palpated masses or tenderness  Patient informed chaperone available to be present for breast and pelvic exam. Patient has requested no chaperone to be present. Patient has been advised what will be completed during breast and pelvic exam.   Assessment/Plan:  71 y.o. G2P0001 for breast and pelvic exam.   Encounter for breast and pelvic examination - Education provided on SBEs, importance of preventative screenings, current guidelines, high calcium diet, regular exercise, and multivitamin daily. Labs with PCP.   Postmenopausal - no HRT  History of endometrial cancer. FIGO Stage IIIC1 (T2N1,[place]) - 2017 endometrial cancer stage IIIC1, managed with robotic hysterectomy with BSO, chemo and radiation. Saw Gyn oncology x 5 years.   Osteopenia of multiple sites - T-score -2.3 without elevated FRAX. Will schedule DXA in February. Continue Vitamin D and high calcium diet. Has not been as active due to skin surgeries.   Screening for cervical cancer - 2017 glandular cells, diagnosed with endometrial cancer. Vaginal pap last year normal. No longer screening per guidelines.   Screening for breast cancer - Normal mammogram history.  Continue annual screenings.  Normal breast exam today. Will request records.   Screening for colon cancer - 2018 colonoscopy. Had scheduled in July but canceled due to skin surgeries. Plans to rescheduled after that.  Return in 2 years  for breast and pelvic exam.     Tamela Gammon DNP, 10:03 AM 04/12/2022

## 2022-04-14 ENCOUNTER — Encounter: Payer: Self-pay | Admitting: Nurse Practitioner

## 2022-04-25 ENCOUNTER — Encounter: Payer: Self-pay | Admitting: *Deleted

## 2022-05-17 DIAGNOSIS — I872 Venous insufficiency (chronic) (peripheral): Secondary | ICD-10-CM | POA: Diagnosis not present

## 2022-05-17 DIAGNOSIS — C44722 Squamous cell carcinoma of skin of right lower limb, including hip: Secondary | ICD-10-CM | POA: Diagnosis not present

## 2022-05-23 ENCOUNTER — Other Ambulatory Visit: Payer: Self-pay | Admitting: *Deleted

## 2022-05-23 DIAGNOSIS — Z122 Encounter for screening for malignant neoplasm of respiratory organs: Secondary | ICD-10-CM

## 2022-05-23 DIAGNOSIS — Z87891 Personal history of nicotine dependence: Secondary | ICD-10-CM

## 2022-05-23 DIAGNOSIS — F17210 Nicotine dependence, cigarettes, uncomplicated: Secondary | ICD-10-CM

## 2022-06-01 DIAGNOSIS — Z4801 Encounter for change or removal of surgical wound dressing: Secondary | ICD-10-CM | POA: Diagnosis not present

## 2022-06-01 DIAGNOSIS — Z48817 Encounter for surgical aftercare following surgery on the skin and subcutaneous tissue: Secondary | ICD-10-CM | POA: Diagnosis not present

## 2022-06-29 ENCOUNTER — Encounter (HOSPITAL_COMMUNITY): Payer: Self-pay

## 2022-06-29 ENCOUNTER — Ambulatory Visit (HOSPITAL_COMMUNITY)
Admission: RE | Admit: 2022-06-29 | Discharge: 2022-06-29 | Disposition: A | Discharge status: Home / Self Care | Payer: PPO | Source: Ambulatory Visit | Attending: Acute Care | Admitting: Acute Care

## 2022-06-29 ENCOUNTER — Ambulatory Visit (INDEPENDENT_AMBULATORY_CARE_PROVIDER_SITE_OTHER): Payer: PPO | Admitting: Pulmonary Disease

## 2022-06-29 ENCOUNTER — Encounter: Payer: Self-pay | Admitting: Pulmonary Disease

## 2022-06-29 DIAGNOSIS — Z122 Encounter for screening for malignant neoplasm of respiratory organs: Secondary | ICD-10-CM

## 2022-06-29 DIAGNOSIS — Z87891 Personal history of nicotine dependence: Secondary | ICD-10-CM

## 2022-06-29 DIAGNOSIS — F17210 Nicotine dependence, cigarettes, uncomplicated: Secondary | ICD-10-CM

## 2022-06-29 NOTE — Patient Instructions (Signed)
Thank you for participating in the [facility] Screening Program. It was our pleasure to meet you today. We will call you with the results of your scan within the next few days. Your scan will be assigned a Lung RADS category score by the physicians reading the scans.  This Lung RADS score determines follow up scanning.  See below for description of categories, and follow up screening recommendations. We will be in touch to schedule your follow up screening annually or based on recommendations of our providers. We will fax a copy of your scan results to your Primary Care Physician, or the physician who referred you to the program, to ensure they have the results. Please call the office if you have any questions or concerns regarding your scanning experience or results.  Our office number is [phone]. Please speak with Denise Phelps, RN. , or  Denise Buckner RN, They are  our Lung Cancer Screening RN.'s If They are unavailable when you call, Please leave a message on the voice mail. We will return your call at our earliest convenience.This voice mail is monitored several times a day.  Remember, if your scan is normal, we will scan you annually as long as you continue to meet the criteria for the program. (Age 55-77, Current smoker or smoker who has quit within the last 15 years). If you are a smoker, remember, quitting is the single most powerful action that you can take to decrease your risk of lung cancer and other pulmonary, breathing related problems. We know quitting is hard, and we are here to help.  Please let us know if there is anything we can do to help you meet your goal of quitting. If you are a former smoker, congratulations. We are proud of you! Remain smoke free! Remember you can refer friends or family members through the number above.  We will screen them to make sure they meet criteria for the program. Thank you for helping us take better care of you by  participating in Lung Screening.  You can receive free nicotine replacement therapy ( patches, gum or mints) by calling 1-800-QUIT NOW. Please call so we can get you on the path to becoming  a non-smoker. I know it is hard, but you can do this!  Lung RADS Categories:  Lung RADS 1: no nodules or definitely non-concerning nodules.  Recommendation is for a repeat annual scan in 12 months.  Lung RADS 2:  nodules that are non-concerning in appearance and behavior with a very low likelihood of becoming an active cancer. Recommendation is for a repeat annual scan in 12 months.  Lung RADS 3: nodules that are probably non-concerning , includes nodules with a low likelihood of becoming an active cancer.  Recommendation is for a 6-month repeat screening scan. Often noted after an upper respiratory illness. We will be in touch to make sure you have no questions, and to schedule your 6-month scan.  Lung RADS 4 A: nodules with concerning findings, recommendation is most often for a follow up scan in 3 months or additional testing based on our provider's assessment of the scan. We will be in touch to make sure you have no questions and to schedule the recommended 3 month follow up scan.  Lung RADS 4 B:  indicates findings that are concerning. We will be in touch with you to schedule additional diagnostic testing based on our provider's  assessment of the scan.  Other options for assistance in smoking cessation (   As covered by your insurance benefits)  Hypnosis for smoking cessation  [employer] [phone]  Acupuncture for smoking cessation  [facility] [phone]   

## 2022-06-29 NOTE — Progress Notes (Signed)
Shared Decision Making Visit Lung Cancer Screening Program [phone])   Eligibility: Age 71 y.o. Pack Years Smoking History Calculation 60 (# packs/per year x # years smoked) Recent History of coughing up blood  no Unexplained weight loss? no ( >Than 15 pounds within the last 6 months ) Prior History Lung / other cancer yes (Diagnosis within the last 5 years already requiring surveillance chest CT Scans). Uterine in 2017.  Smoking Status Current Smoker  Visit Components: Discussion included one or more decision making aids. yes Discussion included risk/benefits of screening. yes Discussion included potential follow up diagnostic testing for abnormal scans. yes Discussion included meaning and risk of over diagnosis. yes Discussion included meaning and risk of False Positives. yes Discussion included meaning of total radiation exposure. yes  Counseling Included: Importance of adherence to annual lung cancer LDCT screening. yes Impact of comorbidities on ability to participate in the program. yes Ability and willingness to under diagnostic treatment. yes  Smoking Cessation Counseling: Current Smokers:  Discussed importance of smoking cessation. yes Information about tobacco cessation classes and interventions provided to patient. yes Asymptomatic Patient yes  Counseling (Intermediate counseling: > three minutes counseling) [id number] Information about tobacco cessation classes and interventions provided to patient. Yes Patient provided with "ticket" for LDCT Scan. yes Written Order for Lung Cancer Screening with LDCT placed in Epic. Yes (CT Chest Lung Cancer Screening Low Dose W/O CM) [id number] Z12.2-Screening of respiratory organs Z87.891-Personal history of nicotine dependence   Lauraine Rinne, NP

## 2022-07-05 DIAGNOSIS — S81801A Unspecified open wound, right lower leg, initial encounter: Secondary | ICD-10-CM | POA: Diagnosis not present

## 2022-07-13 ENCOUNTER — Ambulatory Visit (HOSPITAL_COMMUNITY)
Admission: RE | Admit: 2022-07-13 | Discharge: 2022-07-13 | Disposition: A | Discharge status: Home / Self Care | Payer: PPO | Source: Ambulatory Visit | Attending: Acute Care | Admitting: Acute Care

## 2022-07-13 DIAGNOSIS — Z122 Encounter for screening for malignant neoplasm of respiratory organs: Secondary | ICD-10-CM | POA: Insufficient documentation

## 2022-07-13 DIAGNOSIS — F17210 Nicotine dependence, cigarettes, uncomplicated: Secondary | ICD-10-CM | POA: Insufficient documentation

## 2022-07-13 DIAGNOSIS — Z87891 Personal history of nicotine dependence: Secondary | ICD-10-CM | POA: Diagnosis not present

## 2022-07-14 ENCOUNTER — Encounter: Payer: Self-pay | Admitting: *Deleted

## 2022-07-15 ENCOUNTER — Other Ambulatory Visit: Payer: Self-pay

## 2022-07-15 DIAGNOSIS — Z87891 Personal history of nicotine dependence: Secondary | ICD-10-CM

## 2022-07-15 DIAGNOSIS — F17210 Nicotine dependence, cigarettes, uncomplicated: Secondary | ICD-10-CM

## 2022-07-15 DIAGNOSIS — Z122 Encounter for screening for malignant neoplasm of respiratory organs: Secondary | ICD-10-CM

## 2022-08-01 HISTORY — PX: MOHS SURGERY: SUR867

## 2022-08-04 ENCOUNTER — Other Ambulatory Visit: Payer: Self-pay | Admitting: Family Medicine

## 2022-08-19 DIAGNOSIS — L905 Scar conditions and fibrosis of skin: Secondary | ICD-10-CM | POA: Diagnosis not present

## 2022-08-19 DIAGNOSIS — L300 Nummular dermatitis: Secondary | ICD-10-CM | POA: Diagnosis not present

## 2022-09-12 DIAGNOSIS — D485 Neoplasm of uncertain behavior of skin: Secondary | ICD-10-CM | POA: Diagnosis not present

## 2022-09-12 DIAGNOSIS — C44722 Squamous cell carcinoma of skin of right lower limb, including hip: Secondary | ICD-10-CM | POA: Diagnosis not present

## 2022-09-26 DIAGNOSIS — C44722 Squamous cell carcinoma of skin of right lower limb, including hip: Secondary | ICD-10-CM | POA: Diagnosis not present

## 2022-10-03 DIAGNOSIS — C44722 Squamous cell carcinoma of skin of right lower limb, including hip: Secondary | ICD-10-CM | POA: Diagnosis not present

## 2022-10-13 DIAGNOSIS — C44722 Squamous cell carcinoma of skin of right lower limb, including hip: Secondary | ICD-10-CM | POA: Diagnosis not present

## 2022-11-14 DIAGNOSIS — H2513 Age-related nuclear cataract, bilateral: Secondary | ICD-10-CM | POA: Diagnosis not present

## 2022-11-14 DIAGNOSIS — H524 Presbyopia: Secondary | ICD-10-CM | POA: Diagnosis not present

## 2022-11-16 DIAGNOSIS — S81802A Unspecified open wound, left lower leg, initial encounter: Secondary | ICD-10-CM | POA: Diagnosis not present

## 2022-11-16 DIAGNOSIS — Z48817 Encounter for surgical aftercare following surgery on the skin and subcutaneous tissue: Secondary | ICD-10-CM | POA: Diagnosis not present

## 2022-12-09 ENCOUNTER — Encounter: Payer: Self-pay | Admitting: Internal Medicine

## 2022-12-12 ENCOUNTER — Ambulatory Visit (INDEPENDENT_AMBULATORY_CARE_PROVIDER_SITE_OTHER): Payer: PPO | Admitting: Family Medicine

## 2022-12-12 ENCOUNTER — Encounter: Payer: Self-pay | Admitting: Family Medicine

## 2022-12-12 VITALS — BP 128/78 | HR 77 | Temp 98.6°F | Ht 63.0 in | Wt 129.6 lb

## 2022-12-12 DIAGNOSIS — T451X5A Adverse effect of antineoplastic and immunosuppressive drugs, initial encounter: Secondary | ICD-10-CM | POA: Diagnosis not present

## 2022-12-12 DIAGNOSIS — F17200 Nicotine dependence, unspecified, uncomplicated: Secondary | ICD-10-CM

## 2022-12-12 DIAGNOSIS — G620 Drug-induced polyneuropathy: Secondary | ICD-10-CM

## 2022-12-12 DIAGNOSIS — E785 Hyperlipidemia, unspecified: Secondary | ICD-10-CM

## 2022-12-12 DIAGNOSIS — J439 Emphysema, unspecified: Secondary | ICD-10-CM | POA: Diagnosis not present

## 2022-12-12 DIAGNOSIS — Z0000 Encounter for general adult medical examination without abnormal findings: Secondary | ICD-10-CM

## 2022-12-12 DIAGNOSIS — I700 Atherosclerosis of aorta: Secondary | ICD-10-CM | POA: Diagnosis not present

## 2022-12-12 MED ORDER — TRAZODONE HCL 50 MG PO TABS: 25.0000 mg | ORAL_TABLET | Freq: Every evening | ORAL | 3 refills | Status: DC | PRN

## 2022-12-12 NOTE — Patient Instructions (Addendum)
Let us know when you get your next [facility] vaccine and COVID shot  You are eligible to schedule your annual wellness visit with our nurse specialist Inetta Fermo.  Please consider scheduling this before you leave today  Please stop by lab before you go If you have mychart- we will send your results within 3 business days of [place] receiving them.  If you do not have mychart- we will call you about results within 5 business days of [place] receiving them.  *please also note that you will see labs on mychart as soon as they post. I will later go in and write notes on them- will say "notes from Dr. Durene Cal"   Recommended follow up: Return in about 1 year (around 12/12/2023) for physical or sooner if needed.Schedule b4 you leave.

## 2022-12-12 NOTE — Progress Notes (Signed)
Phone [phone]   Subjective:  Patient presents today for their annual physical. Chief complaint-noted.   See problem oriented charting- ROS- full  review of systems was completed and negative except for: fatigue, allergies with sinus pressure, sneezing, constipation, wound on leg healing, one episode of passing out/loss of consciousness towards end of bowel movement with constipation.   The following were reviewed and entered/updated in epic: Past Medical History:  Diagnosis Date   Adenocarcinoma of endometrium (HCC) 09/2015   Allergy    mild seasonal   Atypical glandular cells of undetermined significance (AGUS) on cervical Pap smear 09/2015   GERD (gastroesophageal reflux disease)    Heart murmur    as teenager but not mentioned since   History of chemotherapy    completed nov 2017   History of radiation therapy 01/14/2016-02/23/2016   pelvis 45 Gy in 25 fx   History of radiation therapy 03/08/2016, 03/17/2016, 03/24/2016   vaginal cuff 18 Gy in 3 fx   Hyperlipidemia    Osteopenia 09/2015   T score -1.4 FRAX 8.5%/0.8%   Patient Active Problem List   Diagnosis Date Noted   Chemotherapy-induced peripheral neuropathy (HCC) 04/09/2016    Priority: High   Continuous tobacco abuse 11/13/2015    Priority: High   History of endometrial cancer. FIGO Stage IIIC1 (T2N1,MO) 11/09/2015    Priority: High   Emphysema lung (HCC) 12/12/2022    Priority: Medium    Aortic atherosclerosis (HCC) 05/07/2020    Priority: Medium    Osteopenia determined by x-ray 11/13/2015    Priority: Medium    Abdominal aortic aneurysm (AAA) (HCC) 11/13/2015    Priority: Medium    Hx of adenomatous colonic polyps 11/13/2015    Priority: Medium    Hematuria 12/04/2012    Priority: Medium    Hyperlipidemia 05/08/2008    Priority: Medium    Family history of melanoma 10/10/2014    Priority: Low   Former smoker 10/10/2014    Priority: Low   Insomnia 10/10/2014    Priority: Low   Finger fracture, left  07/13/2021   Metacarpal bone fracture 07/13/2021   Restless legs 12/18/2015   Constipation 11/28/2015   Macrocytosis without anemia 11/13/2015   Past Surgical History:  Procedure Laterality Date   COLONOSCOPY     IR GENERIC HISTORICAL  03/02/2016   IR US GUIDE VASC ACCESS RIGHT 03/02/2016 [name], MD [facility]   IR GENERIC HISTORICAL  03/02/2016   IR FLUORO GUIDE CV LINE RIGHT 03/02/2016 [name], MD [facility]   LAPAROSCOPY     POLYPECTOMY     ROBOTIC ASSISTED TOTAL HYSTERECTOMY WITH BILATERAL SALPINGO OOPHERECTOMY  10/29/15   robotic hysterectomy, BSO and sentinel lymph node biopsy of pelvic and PA nodes at [facility] with Dr [name].    TUBAL LIGATION      Family History  Problem Relation Age of Onset   Cancer Mother        larynx   Heart failure Father    Heart attack Father 61       smoker, war vet   Melanoma Father    Heart attack Brother 67   Colon polyps Brother    Colon cancer Paternal Grandmother 64   Colon polyps Sister    Esophageal cancer Son    Rectal cancer Neg Hx    Stomach cancer Neg Hx     Medications- reviewed and updated Current Outpatient Medications  Medication Sig Dispense Refill   atorvastatin (LIPITOR) 20 MG tablet TAKE 1 TABLET(20  MG) BY MOUTH DAILY 90 tablet 3   b complex vitamins capsule Take 1 capsule by mouth daily.     Multiple Vitamin (MULTIVITAMIN) tablet Take 1 tablet by mouth daily.     traZODone (DESYREL) 50 MG tablet Take 0.5-1 tablets (25-50 mg total) by mouth at bedtime as needed for sleep. 90 tablet 3   VITAMIN D PO Take by mouth.     No current facility-administered medications for this visit.    Allergies-reviewed and updated Allergies  Allergen Reactions   Codeine Sulfate     REACTION: nausea, pass out   Neomycin-Bacitracin Zn-Polymyx     REACTION: redness    Social History   Social History Narrative   Married 1970 (Jonny Ruiz is a patient here). 1 living son (1 son died of esophageal cancer- had daughter Konrad Felix and  they were closed). 3 grandchildren. 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 Oropharynx normal Neck: no thyromegaly CV: RRR no murmurs rubs or gallops Lungs: CTAB no crackles, wheeze, rhonchi Abdomen: soft/nontender/nondistended/normal bowel sounds. No rebound or guarding.  Ext: no edema Skin: warm, dry Neuro: grossly normal, moves all extremities, PERRLA   Assessment and Plan   72 y.o. female presenting for annual physical.  Health Maintenance counseling: 1. Anticipatory guidance: Patient counseled regarding regular dental exams - wears dentures, eye exams - yearly,  avoiding smoking and second hand-  smoke-3-4 per day encouraged cessation- one of her few outlets and hard to give up , limiting alcohol to 1 beverage per day , no illicit drugs .   2. Risk factor reduction:  Advised patient of need for regular exercise and diet rich and fruits and vegetables to reduce risk of heart attack and stroke.  Exercise- walking hard with her leg wound but trying to pick up gradually as the leg is improving. Weight bearing exercise also good for bone density and for anxiety/stress Diet/weight management-stable- reasonably healthy diet.  Wt Readings from Last 3 Encounters:  12/12/22 129 lb 9.6 oz (58.8 kg)  04/12/22 133 lb (60.3 kg)  12/09/21 130 lb 3.2 oz (59.1 kg)  3. Immunizations/screenings/ancillary studies- looking in [facility] for shingrix #1- plans to get #2 at later date , COVID vaccine considering Immunization History  Administered Date(s) Administered   Fluad Quad(high Dose 65+)  10/23/2019, 05/07/2020   Influenza Split 04/12/2012   Influenza, High Dose Seasonal PF 10/09/2018   Influenza,inj,Quad PF,6+ Mos 07/14/2015, 06/30/2016   Influenza-Unspecified 08/15/2014, 05/24/2022   PFIZER(Purple Top)SARS-COV-2 Vaccination 09/09/2019, 10/04/2019, 06/10/2020   Pneumococcal Conjugate-13 11/07/2017   Pneumococcal Polysaccharide-23 12/31/2018   Td 11/12/2009   Tdap 07/13/2021   Zoster, Live 12/04/2012  4. Cervical cancer screening- prior to last year had Pap of vaginal cuff and was advised to not have to repeat in the future by Dr. Penni Bombard. Dr. Andrey Farmer did one in 2022 as well- released again  5. Breast cancer screening-  breast exam with GYN and mammogram-05/24/22- has required biopsy-  needs to schedule her follow up  mammogram 6. Colon cancer screening - 02/20/2017 with 5-year repeat planned due to polyp history-scheduled with Dr. Marina Goodell later this month 7. Skin cancer screening-family history of melanoma-follows with Dr. 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 Maury Dus. advised regular sunscreen use. Denies worrisome, changing, or new skin lesions.  8. Birth control/STD check- postmenopausal and surgery, only active with husband  9. Osteoporosis screening at 34- osteopenia noted but patient on calcium vitamin D-most recent scan 09/15/2020 with gynecology continue current medication and weightbearing exercise worst t score -2.3- prefers to do next year and get through some of other health maintenance needs  10. Smoking associated screening - smoker-last 2 years was at 3 to 4 cigarettes/day and strongly encouraged cessation-encouraged again the same today-not ready to quit - she wants to continue to gradually cut back. Enrolled in lung cancer screening -will check urinalysis today- if still has blood would need further evaluation (treated urinary tract infection last  year)   Status of chronic or acute concerns   % History of endometrial cancer with metastasis-history of robotic surgery, chemotherapy and radiation-followed with Dr. Andrey Farmer  #Chemotherapy-induced neuropathy S: Started after chemotherapy.  She has not desired treatment A/P: ongoing issue- more of inconvenience    #hyperlipidemia #Aortic atherosclerosis S: Medication: Atorvastatin 20 mg with over 50% improvement in LDL. -Family history of MI in 55s and brother and father -Technically with LDL goal under 33 but has had myalgias in the past so we have been cautious to increase Lab Results  Component Value Date   CHOL 178 12/09/2021   HDL 57.80 12/09/2021   LDLCALC 96 12/09/2021   LDLDIRECT 88.0 11/06/2020   TRIG 118.0 12/09/2021   CHOLHDL 3 12/09/2021  A/P: aortic atherosclerosis (presumed stable)- LDL goal ideally <70 - tolerating slightly higher LDL due to myalgias on higher dose  #Abdominal aortic aneurysm S: Noted 10/28/2015 and again 12/01/2020 as ectatic.  On CT scan 04/16/2021 was noted as low-level aneurysm at 3.2 cm and recommended 3-year follow-up A/P: we will hold off at this point- blood pressure reasonably well controlled- ideally avoid quiniolones    #Insomnia S: Medication: Trazodone 50 mg nightly - feels groggy unless starts earlier in day maybe 6 pm A/P: has bene off for a while and wants to restart trazodone- will send this n for her   #constipation- had an episode of significant constipation- felt lightheaded- went of the lay down but did eventually pass out and slight hit on her head (no lingering headache or blurry vision). Was out short period then husband came in- she had large bowel movement once awake/conscious. Happened years ago with heavy chemo treatments when constipated. She has taken some miralax since then and finds it helpful.  -vasovagal episode- declines further workup at this time  Emphysema lung (HCC) Asymptomatic- incidental finding- some cough but  related to allergies  Recommended follow up: Return in about 1 year (around 12/12/2023) for physical or sooner if needed.Schedule b4 you leave. Future Appointments  Date Time Provider Department Center  12/28/2022 12:30 PM [facility] PREVISIT RM 50 [facility]  01/18/2023 10:30 AM Hilarie Fredrickson, MD [facility]   Lab/Order associations:NOT fasting   ICD-10-CM   1. Preventative health care  Z00.00     2. Current smoker  F17.200 Urinalysis, Routine w reflex microscopic    3. Hyperlipidemia, unspecified hyperlipidemia type  E78.5 Comprehensive metabolic panel  CBC with Differential/Platelet    Lipid panel    4. Chemotherapy-induced peripheral neuropathy (HCC) Chronic G62.0    T45.1X5A     5. Aortic atherosclerosis (HCC) Chronic I70.0     6. Pulmonary emphysema, unspecified emphysema type (HCC)  J43.9       Meds ordered this encounter  Medications   traZODone (DESYREL) 50 MG tablet    Sig: Take 0.5-1 tablets (25-50 mg total) by mouth at bedtime as needed for sleep.    Dispense:  90 tablet    Refill:  3    Return precautions advised.  Tana Conch, MD

## 2022-12-12 NOTE — Assessment & Plan Note (Signed)
Asymptomatic- incidental finding- some cough but related to allergies

## 2022-12-13 LAB — COMPREHENSIVE METABOLIC PANEL
ALT: 15 U/L (ref 0–35)
AST: 19 U/L (ref 0–37)
Albumin: 4.2 g/dL (ref 3.5–5.2)
Alkaline Phosphatase: 87 U/L (ref 39–117)
BUN: 13 mg/dL (ref 6–23)
CO2: 30 mEq/L (ref 19–32)
Calcium: 10 mg/dL (ref 8.4–10.5)
Chloride: 104 mEq/L (ref 96–112)
Creatinine, Ser: 1.26 mg/dL — ABNORMAL HIGH (ref 0.40–1.20)
GFR: 43.14 mL/min — ABNORMAL LOW (ref >60.00)
Glucose, Bld: 94 mg/dL (ref 70–99)
Potassium: 4.3 mEq/L (ref 3.5–5.1)
Sodium: 142 mEq/L (ref 135–145)
Total Bilirubin: 0.4 mg/dL (ref 0.2–1.2)
Total Protein: 6.8 g/dL (ref 6.0–8.3)

## 2022-12-13 LAB — LIPID PANEL
Cholesterol: 163 mg/dL (ref 0–200)
HDL: 56.2 mg/dL (ref >39.00)
LDL Cholesterol: 74 mg/dL (ref 0–99)
NonHDL: 106.78
Total CHOL/HDL Ratio: 3
Triglycerides: 166 mg/dL — ABNORMAL HIGH (ref 0.0–149.0)
VLDL: 33.2 mg/dL (ref 0.0–40.0)

## 2022-12-13 LAB — CBC WITH DIFFERENTIAL/PLATELET
Basophils Absolute: 0.1 10*3/uL (ref 0.0–0.1)
Basophils Relative: 1.1 % (ref 0.0–3.0)
Eosinophils Absolute: 0.1 10*3/uL (ref 0.0–0.7)
Eosinophils Relative: 1.7 % (ref 0.0–5.0)
HCT: 40.1 % (ref 36.0–46.0)
Hemoglobin: 13.5 g/dL (ref 12.0–15.0)
Lymphocytes Relative: 27.1 % (ref 12.0–46.0)
Lymphs Abs: 1.7 10*3/uL (ref 0.7–4.0)
MCHC: 33.7 g/dL (ref 30.0–36.0)
MCV: 107.3 fl — ABNORMAL HIGH (ref 78.0–100.0)
Monocytes Absolute: 0.5 10*3/uL (ref 0.1–1.0)
Monocytes Relative: 7.8 % (ref 3.0–12.0)
Neutro Abs: 4 10*3/uL (ref 1.4–7.7)
Neutrophils Relative %: 62.3 % (ref 43.0–77.0)
Platelets: 248 10*3/uL (ref 150.0–400.0)
RBC: 3.74 Mil/uL — ABNORMAL LOW (ref 3.87–5.11)
RDW: 13.8 % (ref 11.5–15.5)
WBC: 6.4 10*3/uL (ref 4.0–10.5)

## 2022-12-13 LAB — URINALYSIS, ROUTINE W REFLEX MICROSCOPIC
Bilirubin Urine: NEGATIVE
Ketones, ur: NEGATIVE
Leukocytes,Ua: NEGATIVE
Nitrite: POSITIVE — AB
Specific Gravity, Urine: 1.015 (ref 1.000–1.030)
Total Protein, Urine: NEGATIVE
Urine Glucose: NEGATIVE
Urobilinogen, UA: 0.2 (ref 0.0–1.0)
pH: 6.5 (ref 5.0–8.0)

## 2022-12-14 ENCOUNTER — Encounter: Payer: Self-pay | Admitting: Family Medicine

## 2022-12-15 ENCOUNTER — Other Ambulatory Visit: Payer: Self-pay

## 2022-12-15 DIAGNOSIS — R319 Hematuria, unspecified: Secondary | ICD-10-CM

## 2022-12-16 ENCOUNTER — Other Ambulatory Visit: Payer: PPO

## 2022-12-16 DIAGNOSIS — S81801A Unspecified open wound, right lower leg, initial encounter: Secondary | ICD-10-CM | POA: Diagnosis not present

## 2022-12-16 DIAGNOSIS — R319 Hematuria, unspecified: Secondary | ICD-10-CM

## 2022-12-18 LAB — URINE CULTURE
MICRO NUMBER:: 14971650
SPECIMEN QUALITY:: ADEQUATE

## 2022-12-19 ENCOUNTER — Other Ambulatory Visit: Payer: Self-pay | Admitting: Family Medicine

## 2022-12-19 MED ORDER — SULFAMETHOXAZOLE-TRIMETHOPRIM 800-160 MG PO TABS: 1.0000 | ORAL_TABLET | Freq: Two times a day (BID) | ORAL | 0 refills | Status: DC

## 2022-12-20 ENCOUNTER — Encounter: Payer: Self-pay | Admitting: Oncology

## 2022-12-20 ENCOUNTER — Other Ambulatory Visit: Payer: Self-pay

## 2022-12-20 DIAGNOSIS — N3000 Acute cystitis without hematuria: Secondary | ICD-10-CM

## 2022-12-28 ENCOUNTER — Ambulatory Visit (AMBULATORY_SURGERY_CENTER): Payer: PPO

## 2022-12-28 VITALS — Ht 63.0 in | Wt 129.0 lb

## 2022-12-28 DIAGNOSIS — Z800 Family history of malignant neoplasm of digestive organs: Secondary | ICD-10-CM

## 2022-12-28 DIAGNOSIS — Z86010 Personal history of colonic polyps: Secondary | ICD-10-CM

## 2022-12-28 MED ORDER — NA SULFATE-K SULFATE-MG SULF 17.5-3.13-1.6 GM/177ML PO SOLN
1.0000 | Freq: Once | ORAL | 0 refills | Status: AC
Start: 2022-12-28 — End: 2022-12-28

## 2022-12-28 NOTE — Progress Notes (Signed)
No egg or soy allergy known to patient  No issues known to pt with past sedation with any surgeries or procedures Patient denies ever being told they had issues or difficulty with intubation  No FH of Malignant Hyperthermia Pt is not on diet pills Pt is not on  home 02  Pt is not on blood thinners  Pt has issues with constipation  No A fib or A flutter Have any cardiac testing pending--no Pt instructed to use Singlecare.com or GoodRx for a price reduction on prep  Patient's chart reviewed by John Nulty CNRA prior to previsit and patient appropriate for the [facility].  Previsit completed and red dot placed by patient's name on their procedure day (on provider's schedule).   Can ambulate without assistance  

## 2023-01-02 ENCOUNTER — Other Ambulatory Visit: Payer: PPO

## 2023-01-02 DIAGNOSIS — N3000 Acute cystitis without hematuria: Secondary | ICD-10-CM

## 2023-01-03 ENCOUNTER — Telehealth: Payer: Self-pay | Admitting: Internal Medicine

## 2023-01-03 NOTE — Telephone Encounter (Signed)
Prep instructions printed will be sent to home address. Patient made aware,

## 2023-01-03 NOTE — Telephone Encounter (Signed)
Patient is requesting a copy of her prep instruction for colonoscopy scheduled for 6/19 be mailed to her. Please advise, thank you.

## 2023-01-04 LAB — URINE CULTURE
MICRO NUMBER:: 15033025
SPECIMEN QUALITY:: ADEQUATE

## 2023-01-05 ENCOUNTER — Encounter: Payer: Self-pay | Admitting: Internal Medicine

## 2023-01-05 ENCOUNTER — Other Ambulatory Visit: Payer: Self-pay | Admitting: Family Medicine

## 2023-01-05 LAB — URINE CULTURE

## 2023-01-05 MED ORDER — NITROFURANTOIN MONOHYD MACRO 100 MG PO CAPS: 100.0000 mg | ORAL_CAPSULE | Freq: Two times a day (BID) | ORAL | 0 refills | Status: DC

## 2023-01-06 ENCOUNTER — Other Ambulatory Visit: Payer: Self-pay

## 2023-01-06 DIAGNOSIS — R319 Hematuria, unspecified: Secondary | ICD-10-CM

## 2023-01-06 NOTE — Telephone Encounter (Signed)
Inbound call from patient stating she started antibiotic Nitrofurantoin. Requesting a call back to make sure that this medication will not interfere with colonoscopy scheduled for 6/19. Please advise, thank you.

## 2023-01-06 NOTE — Telephone Encounter (Signed)
Spoke with patient. All questions answered.

## 2023-01-18 ENCOUNTER — Ambulatory Visit (AMBULATORY_SURGERY_CENTER): Payer: PPO | Admitting: Internal Medicine

## 2023-01-18 ENCOUNTER — Encounter: Payer: Self-pay | Admitting: Internal Medicine

## 2023-01-18 VITALS — BP 103/45 | HR 64 | Temp 97.8°F | Resp 16 | Ht 63.0 in | Wt 129.0 lb

## 2023-01-18 DIAGNOSIS — Z09 Encounter for follow-up examination after completed treatment for conditions other than malignant neoplasm: Secondary | ICD-10-CM | POA: Diagnosis not present

## 2023-01-18 DIAGNOSIS — D124 Benign neoplasm of descending colon: Secondary | ICD-10-CM | POA: Diagnosis not present

## 2023-01-18 DIAGNOSIS — D123 Benign neoplasm of transverse colon: Secondary | ICD-10-CM

## 2023-01-18 DIAGNOSIS — K635 Polyp of colon: Secondary | ICD-10-CM | POA: Diagnosis not present

## 2023-01-18 DIAGNOSIS — Z86010 Personal history of colonic polyps: Secondary | ICD-10-CM

## 2023-01-18 DIAGNOSIS — F419 Anxiety disorder, unspecified: Secondary | ICD-10-CM | POA: Diagnosis not present

## 2023-01-18 DIAGNOSIS — Z800 Family history of malignant neoplasm of digestive organs: Secondary | ICD-10-CM | POA: Diagnosis not present

## 2023-01-18 DIAGNOSIS — D122 Benign neoplasm of ascending colon: Secondary | ICD-10-CM | POA: Diagnosis not present

## 2023-01-18 MED ORDER — SODIUM CHLORIDE 0.9 % IV SOLN: 500.0000 mL | INTRAVENOUS | Status: DC

## 2023-01-18 NOTE — Op Note (Signed)
[facility] Patient Name: Jacqueline Hammond Procedure Date: 01/18/2023 10:49 AM MRN: [id number] Endoscopist: Wilhemina Bonito. Marina Goodell , MD, [id number] Age: 72 Referring MD:  Date of Birth: May 21, 1951 Gender: Female Account #: [id number] Procedure:                Colonoscopy with cold snare polypectomy x 4 Indications:              High risk colon cancer surveillance: Personal                            history of non-advanced adenomas. Previous                            examinations 2009, 2018 Medicines:                Monitored Anesthesia Care Procedure:                Pre-Anesthesia Assessment:                           - Prior to the procedure, a History and Physical                            was performed, and patient medications and                            allergies were reviewed. The patient's tolerance of                            previous anesthesia was also reviewed. The risks                            and benefits of the procedure and the sedation                            options and risks were discussed with the patient.                            All questions were answered, and informed consent                            was obtained. Prior Anticoagulants: The patient has                            taken no anticoagulant or antiplatelet agents. ASA                            Grade Assessment: II - A patient with mild systemic                            disease. After reviewing the risks and benefits,                            the patient was deemed in satisfactory condition to  undergo the procedure.                           After obtaining informed consent, the colonoscope                            was passed under direct vision. Throughout the                            procedure, the patient's blood pressure, pulse, and                            oxygen saturations were monitored continuously. The                            [id number]-[id number]  Slim SN [id number] was introduced through                            the anus and advanced to the the cecum, identified                            by appendiceal orifice and ileocecal valve. The                            ileocecal valve, appendiceal orifice, and rectum                            were photographed. The quality of the bowel                            preparation was excellent. The colonoscopy was                            performed without difficulty. The patient tolerated                            the procedure well. The bowel preparation used was                            SUPREP via split dose instruction. Scope In: 10:56:14 AM Scope Out: 11:12:08 AM Scope Withdrawal Time: 0 hours 13 minutes 21 seconds  Total Procedure Duration: 0 hours 15 minutes 54 seconds  Findings:                 Four polyps were found in the descending colon,                            transverse colon and ascending colon. The polyps                            were 3 to 5 mm in size. These polyps were removed                            with a cold snare. Resection and retrieval  were                            complete.                           A few small-mouthed diverticula were found in the                            right colon.                           The exam was otherwise without abnormality on                            direct and retroflexion views. Complications:            No immediate complications. Estimated blood loss:                            None. Estimated Blood Loss:     Estimated blood loss: none. Impression:               - Four 3 to 5 mm polyps in the descending colon, in                            the transverse colon and in the ascending colon,                            removed with a cold snare. Resected and retrieved.                           - Diverticulosis in the right colon.                           - The examination was otherwise normal on direct                             and retroflexion views. Recommendation:           - Repeat colonoscopy in 5 years for surveillance.                           - Patient has a contact number available for                            emergencies. The signs and symptoms of potential                            delayed complications were discussed with the                            patient. Return to normal activities tomorrow.                            Written discharge instructions were provided to the  patient.                           - Resume previous diet.                           - Continue present medications.                           - Await pathology results. Wilhemina Bonito. Marina Goodell, MD 01/18/2023 11:17:26 AM This report has been signed electronically.

## 2023-01-18 NOTE — Progress Notes (Signed)
Vss nad trans to pacu 

## 2023-01-18 NOTE — Progress Notes (Signed)
Patient reports no health or medicine changes since pre visit. 

## 2023-01-18 NOTE — Progress Notes (Signed)
HISTORY OF PRESENT ILLNESS:  Jacqueline Hammond is a 72 y.o. female with a history of adenomatous colon polyps.  Presents today for surveillance colonoscopy.  No complaints  REVIEW OF SYSTEMS:  All non-GI ROS negative except for  Past Medical History:  Diagnosis Date   Adenocarcinoma of endometrium (HCC) 09/2015   Allergy    mild seasonal   Anxiety    Atypical glandular cells of undetermined significance (AGUS) on cervical Pap smear 09/2015   GERD (gastroesophageal reflux disease)    Heart murmur    as teenager but not mentioned since   History of chemotherapy    completed nov 2017   History of radiation therapy 01/14/2016-02/23/2016   pelvis 45 Gy in 25 fx   History of radiation therapy 03/08/2016, 03/17/2016, 03/24/2016   vaginal cuff 18 Gy in 3 fx   Hyperlipidemia    Osteopenia 09/2015   T score -1.4 FRAX 8.5%/0.8%    Past Surgical History:  Procedure Laterality Date   COLONOSCOPY     IR GENERIC HISTORICAL  03/02/2016   IR US GUIDE VASC ACCESS RIGHT 03/02/2016 [name], MD [facility]   IR GENERIC HISTORICAL  03/02/2016   IR FLUORO GUIDE CV LINE RIGHT 03/02/2016 [name], MD [facility]   LAPAROSCOPY     MOHS SURGERY  2024   2023-2024 3 leg procedure, 2 arm, & face-1   POLYPECTOMY     ROBOTIC ASSISTED TOTAL HYSTERECTOMY WITH BILATERAL SALPINGO OOPHERECTOMY  10/29/2015   robotic hysterectomy, BSO and sentinel lymph node biopsy of pelvic and PA nodes at [facility] with Dr [name].    TUBAL LIGATION      Social History [name]  reports that she has been smoking cigarettes. She has a 32.00 pack-year smoking history. She has never used smokeless tobacco. She reports that she does not drink alcohol and does not use drugs.  family history includes Cancer in her mother; Colon cancer (age of onset: 40) in her paternal grandmother; Colon polyps in her brother and sister; Esophageal cancer in her son; Heart attack (age of onset: 67) in her brother and father; Heart failure in  her father; Melanoma in her father.  Allergies  Allergen Reactions   Codeine Sulfate     REACTION: nausea, pass out   Neomycin-Bacitracin Zn-Polymyx     REACTION: redness       PHYSICAL EXAMINATION: Vital signs: BP (!) 121/91   Pulse 80   Temp 97.8 F (36.6 C)   Ht 5\' 3"  (1.6 m)   Wt 129 lb (58.5 kg)   SpO2 96%   BMI 22.85 kg/m  General: Well-developed, well-nourished, no acute distress HEENT: Sclerae are anicteric, conjunctiva pink. Oral mucosa intact Lungs: Clear Heart: Regular Abdomen: soft, nontender, nondistended, no obvious ascites, no peritoneal signs, normal bowel sounds. No organomegaly. Extremities: No edema Psychiatric: alert and oriented x3. Cooperative     ASSESSMENT:  History of adenomatous colon polyps   PLAN:   Surveillance colonoscopy

## 2023-01-18 NOTE — Patient Instructions (Addendum)
Recommendation:           - Repeat colonoscopy in 5 years for surveillance.                           - Patient has a contact number available for                            emergencies. The signs and symptoms of potential                            delayed complications were discussed with the                            patient. Return to normal activities tomorrow.                            Written discharge instructions were provided to the                            patient.                           - Resume previous diet.                           - Continue present medications.                           - Await pathology results  Handout on polyps given.  YOU HAD AN ENDOSCOPIC PROCEDURE TODAY AT THE [facility]:   Refer to the procedure report that was given to you for any specific questions about what was found during the examination.  If the procedure report does not answer your questions, please call your gastroenterologist to clarify.  If you requested that your care partner not be given the details of your procedure findings, then the procedure report has been included in a sealed envelope for you to review at your convenience later.  YOU SHOULD EXPECT: Some feelings of bloating in the abdomen. Passage of more gas than usual.  Walking can help get rid of the air that was put into your GI tract during the procedure and reduce the bloating. If you had a lower endoscopy (such as a colonoscopy or flexible sigmoidoscopy) you may notice spotting of blood in your stool or on the toilet paper. If you underwent a bowel prep for your procedure, you may not have a normal bowel movement for a few days.  Please Note:  You might notice some irritation and congestion in your nose or some drainage.  This is from the oxygen used during your procedure.  There is no need for concern and it should clear up in a day or so.  SYMPTOMS TO REPORT IMMEDIATELY:  Following lower endoscopy  (colonoscopy or flexible sigmoidoscopy):  Excessive amounts of blood in the stool  Significant tenderness or worsening of abdominal pains  Swelling of the abdomen that is new, acute  Fever of 100F or higher  For urgent or emergent issues, a gastroenterologist can be reached at any hour by calling [phone]. Do not use MyChart messaging for urgent concerns.  DIET:  We do recommend a small meal at first, but then you may proceed to your regular diet.  Drink plenty of fluids but you should avoid alcoholic beverages for 24 hours.  ACTIVITY:  You should plan to take it easy for the rest of today and you should NOT DRIVE or use heavy machinery until tomorrow (because of the sedation medicines used during the test).    FOLLOW UP: Our staff will call the number listed on your records the next business day following your procedure.  We will call around 7:15- 8:00 am to check on you and address any questions or concerns that you may have regarding the information given to you following your procedure. If we do not reach you, we will leave a message.     If any biopsies were taken you will be contacted by phone or by letter within the next 1-3 weeks.  Please call us at [phone] if you have not heard about the biopsies in 3 weeks.    SIGNATURES/CONFIDENTIALITY: You and/or your care partner have signed paperwork which will be entered into your electronic medical record.  These signatures attest to the fact that that the information above on your After Visit Summary has been reviewed and is understood.  Full responsibility of the confidentiality of this discharge information lies with you and/or your care-partner.

## 2023-01-18 NOTE — Progress Notes (Signed)
Called to room to assist during endoscopic procedure.  Patient ID and intended procedure confirmed with present staff. Received instructions for my participation in the procedure from the performing physician.  

## 2023-01-19 ENCOUNTER — Telehealth: Payer: Self-pay | Admitting: *Deleted

## 2023-01-19 NOTE — Telephone Encounter (Signed)
Post procedure follow up phone call. No answer at number given.  Left message on voicemail.  

## 2023-01-23 ENCOUNTER — Other Ambulatory Visit (INDEPENDENT_AMBULATORY_CARE_PROVIDER_SITE_OTHER): Payer: PPO

## 2023-01-23 DIAGNOSIS — R319 Hematuria, unspecified: Secondary | ICD-10-CM | POA: Diagnosis not present

## 2023-01-23 LAB — URINALYSIS, ROUTINE W REFLEX MICROSCOPIC
Bilirubin Urine: NEGATIVE
Ketones, ur: NEGATIVE
Leukocytes,Ua: NEGATIVE
Nitrite: NEGATIVE
Specific Gravity, Urine: 1.02 (ref 1.000–1.030)
Total Protein, Urine: NEGATIVE
Urine Glucose: NEGATIVE
Urobilinogen, UA: 0.2 (ref 0.0–1.0)
pH: 6 (ref 5.0–8.0)

## 2023-01-24 ENCOUNTER — Encounter: Payer: Self-pay | Admitting: Internal Medicine

## 2023-01-24 ENCOUNTER — Other Ambulatory Visit (INDEPENDENT_AMBULATORY_CARE_PROVIDER_SITE_OTHER): Payer: PPO

## 2023-01-24 DIAGNOSIS — R319 Hematuria, unspecified: Secondary | ICD-10-CM

## 2023-01-25 ENCOUNTER — Other Ambulatory Visit: Payer: PPO

## 2023-01-30 DIAGNOSIS — S81801A Unspecified open wound, right lower leg, initial encounter: Secondary | ICD-10-CM | POA: Diagnosis not present

## 2023-02-07 ENCOUNTER — Telehealth: Payer: Self-pay | Admitting: Family Medicine

## 2023-02-07 DIAGNOSIS — N3000 Acute cystitis without hematuria: Secondary | ICD-10-CM

## 2023-02-07 DIAGNOSIS — R319 Hematuria, unspecified: Secondary | ICD-10-CM

## 2023-02-07 NOTE — Telephone Encounter (Signed)
Patient would like a call back with urine results.

## 2023-02-08 ENCOUNTER — Other Ambulatory Visit (INDEPENDENT_AMBULATORY_CARE_PROVIDER_SITE_OTHER): Payer: PPO

## 2023-02-08 ENCOUNTER — Other Ambulatory Visit: Payer: Self-pay

## 2023-02-08 DIAGNOSIS — R319 Hematuria, unspecified: Secondary | ICD-10-CM | POA: Diagnosis not present

## 2023-02-08 LAB — URINALYSIS, ROUTINE W REFLEX MICROSCOPIC
Bilirubin Urine: NEGATIVE
Ketones, ur: NEGATIVE
Leukocytes,Ua: NEGATIVE
Nitrite: NEGATIVE
Specific Gravity, Urine: <=1.005 — AB (ref 1.000–1.030)
Total Protein, Urine: NEGATIVE
Urine Glucose: NEGATIVE
Urobilinogen, UA: 0.2 (ref 0.0–1.0)
pH: 6 (ref 5.0–8.0)

## 2023-02-08 LAB — POC URINALSYSI DIPSTICK (AUTOMATED)
Bilirubin, UA: NEGATIVE
Glucose, UA: NEGATIVE
Ketones, UA: NEGATIVE
Leukocytes, UA: NEGATIVE
Nitrite, UA: NEGATIVE
Protein, UA: NEGATIVE
Spec Grav, UA: 1.01 (ref 1.010–1.025)
Urobilinogen, UA: 0.2 E.U./dL — AB
pH, UA: 6 (ref 5.0–8.0)

## 2023-02-09 LAB — URINE CULTURE
MICRO NUMBER:: 15182554
SPECIMEN QUALITY:: ADEQUATE

## 2023-02-13 ENCOUNTER — Other Ambulatory Visit: Payer: Self-pay

## 2023-02-13 DIAGNOSIS — R3129 Other microscopic hematuria: Secondary | ICD-10-CM

## 2023-03-02 DIAGNOSIS — Z872 Personal history of diseases of the skin and subcutaneous tissue: Secondary | ICD-10-CM | POA: Diagnosis not present

## 2023-03-02 DIAGNOSIS — Z85828 Personal history of other malignant neoplasm of skin: Secondary | ICD-10-CM | POA: Diagnosis not present

## 2023-03-02 DIAGNOSIS — Z08 Encounter for follow-up examination after completed treatment for malignant neoplasm: Secondary | ICD-10-CM | POA: Diagnosis not present

## 2023-03-02 DIAGNOSIS — L821 Other seborrheic keratosis: Secondary | ICD-10-CM | POA: Diagnosis not present

## 2023-03-02 DIAGNOSIS — D225 Melanocytic nevi of trunk: Secondary | ICD-10-CM | POA: Diagnosis not present

## 2023-03-02 DIAGNOSIS — L814 Other melanin hyperpigmentation: Secondary | ICD-10-CM | POA: Diagnosis not present

## 2023-03-03 ENCOUNTER — Ambulatory Visit (INDEPENDENT_AMBULATORY_CARE_PROVIDER_SITE_OTHER): Payer: PPO | Admitting: Family Medicine

## 2023-03-03 ENCOUNTER — Encounter: Payer: Self-pay | Admitting: Family Medicine

## 2023-03-03 VITALS — BP 102/70 | HR 88 | Temp 98.3°F | Ht 63.0 in | Wt 124.6 lb

## 2023-03-03 DIAGNOSIS — R5383 Other fatigue: Secondary | ICD-10-CM

## 2023-03-03 DIAGNOSIS — R590 Localized enlarged lymph nodes: Secondary | ICD-10-CM | POA: Diagnosis not present

## 2023-03-03 LAB — CBC WITH DIFFERENTIAL/PLATELET
Basophils Absolute: 0.1 10*3/uL (ref 0.0–0.1)
Basophils Relative: 1 % (ref 0.0–3.0)
Eosinophils Absolute: 0.1 10*3/uL (ref 0.0–0.7)
Eosinophils Relative: 0.9 % (ref 0.0–5.0)
HCT: 39.8 % (ref 36.0–46.0)
Hemoglobin: 13.2 g/dL (ref 12.0–15.0)
Lymphocytes Relative: 21.1 % (ref 12.0–46.0)
Lymphs Abs: 1.5 10*3/uL (ref 0.7–4.0)
MCHC: 33.2 g/dL (ref 30.0–36.0)
MCV: 107 fl — ABNORMAL HIGH (ref 78.0–100.0)
Monocytes Absolute: 0.4 10*3/uL (ref 0.1–1.0)
Monocytes Relative: 6 % (ref 3.0–12.0)
Neutro Abs: 5.1 10*3/uL (ref 1.4–7.7)
Neutrophils Relative %: 71 % (ref 43.0–77.0)
Platelets: 226 10*3/uL (ref 150.0–400.0)
RBC: 3.72 Mil/uL — ABNORMAL LOW (ref 3.87–5.11)
RDW: 13.8 % (ref 11.5–15.5)
WBC: 7.2 10*3/uL (ref 4.0–10.5)

## 2023-03-03 LAB — COMPREHENSIVE METABOLIC PANEL
ALT: 14 U/L (ref 0–35)
AST: 19 U/L (ref 0–37)
Albumin: 4.1 g/dL (ref 3.5–5.2)
Alkaline Phosphatase: 87 U/L (ref 39–117)
BUN: 10 mg/dL (ref 6–23)
CO2: 28 mEq/L (ref 19–32)
Calcium: 9.9 mg/dL (ref 8.4–10.5)
Chloride: 104 mEq/L (ref 96–112)
Creatinine, Ser: 0.97 mg/dL (ref 0.40–1.20)
GFR: 58.96 mL/min — ABNORMAL LOW (ref >60.00)
Glucose, Bld: 108 mg/dL — ABNORMAL HIGH (ref 70–99)
Potassium: 3.5 mEq/L (ref 3.5–5.1)
Sodium: 139 mEq/L (ref 135–145)
Total Bilirubin: 0.3 mg/dL (ref 0.2–1.2)
Total Protein: 7.2 g/dL (ref 6.0–8.3)

## 2023-03-03 LAB — TSH: TSH: 0.78 u[IU]/mL (ref 0.35–5.50)

## 2023-03-03 NOTE — Patient Instructions (Addendum)
Let us know if you get your COVID or flu vaccine this fall at the pharmacy.  Let me know if this is not improving within 3 weeks and we can refer you  -typically these go away within a month of illness or provoking factor but we want to be on cautious side with your history -we also discussed doing CXR if not improving but she is enrolled already in lung cancer screening program with reassuring scan 07/13/22  Please stop by lab before you go If you have mychart- we will send your results within 3 business days of [place] receiving them.  If you do not have mychart- we will call you about results within 5 business days of [place] receiving them.  *please also note that you will see labs on mychart as soon as they post. I will later go in and write notes on them- will say "notes from Dr. Durene Cal"

## 2023-03-03 NOTE — Progress Notes (Signed)
Phone [phone] In person visit   Subjective:   Jacqueline Hammond is a 72 y.o. year old very pleasant female patient who presents for/with See problem oriented charting Chief Complaint  Patient presents with   Adenopathy    Pt c/o lump under collar bone that she noticed 2 days ago but unsure how long It has been there.   Past Medical History-  Patient Active Problem List   Diagnosis Date Noted   Chemotherapy-induced peripheral neuropathy (HCC) 04/09/2016    Priority: High   Continuous tobacco abuse 11/13/2015    Priority: High   History of endometrial cancer. FIGO Stage IIIC1 (T2N1,MO) 11/09/2015    Priority: High   Emphysema lung (HCC) 12/12/2022    Priority: Medium    Aortic atherosclerosis (HCC) 05/07/2020    Priority: Medium    Osteopenia determined by x-ray 11/13/2015    Priority: Medium    Abdominal aortic aneurysm (AAA) (HCC) 11/13/2015    Priority: Medium    Hx of adenomatous colonic polyps 11/13/2015    Priority: Medium    Hematuria 12/04/2012    Priority: Medium    Hyperlipidemia 05/08/2008    Priority: Medium    Family history of melanoma 10/10/2014    Priority: Low   Former smoker 10/10/2014    Priority: Low   Insomnia 10/10/2014    Priority: Low   Finger fracture, left 07/13/2021   Metacarpal bone fracture 07/13/2021   Restless legs 12/18/2015   Constipation 11/28/2015   Macrocytosis without anemia 11/13/2015    Medications- reviewed and updated Current Outpatient Medications  Medication Sig Dispense Refill   atorvastatin (LIPITOR) 20 MG tablet TAKE 1 TABLET(20 MG) BY MOUTH DAILY 90 tablet 3   b complex vitamins capsule Take 1 capsule by mouth daily.     clobetasol cream (TEMOVATE) 0.05 % Apply topically.     Multiple Vitamin (MULTIVITAMIN) tablet Take 1 tablet by mouth daily.     traZODone (DESYREL) 50 MG tablet Take 0.5-1 tablets (25-50 mg total) by mouth at bedtime as needed for sleep. 90 tablet 3   triamcinolone cream (KENALOG) 0.1 %  SMARTSIG:Topical 1-2 Times Daily PRN     VITAMIN D PO Take by mouth.     No current facility-administered medications for this visit.     Objective:  BP 102/70   Pulse 88   Temp 98.3 F (36.8 C)   Ht 5\' 3"  (1.6 m)   Wt 124 lb 9.6 oz (56.5 kg)   SpO2 99%   BMI 22.07 kg/m  Gen: NAD, resting comfortably Tympanic membrane without noted abnormality, oropharynx largely normal other than some erythema/signs of drainage in the pharynx, nasal turbinates with clear discharge and edematous - no cervical lymphadenopathy other than one lymph node noted in lower lateral neck about 2 x 1.5 cm CV: RRR no murmurs rubs or gallops Lungs: CTAB no crackles, wheeze, rhonchi Abdomen: soft/nontender/nondistended/normal bowel sounds. No rebound or guarding.  Ext: no edema Skin: warm, dry     Assessment and Plan   # Lymphadenopathy S: Patient noted a lump on left neckabout 2 days ago-stable in size since noted.  She is not sure how long it has been there in total but this was the first time she noted that - No fever, chills, or night sweats -felt like her normal self until about a week ago- feeling really tired since that time. Mild cough and some mucus at her baseline during summer months- no worse than usual. Does have more nasal congestoin in  last few days and more sneezing - Weight on our scales it is down 5 pounds in about 3 months. Appetite is stable for her.  - She does have a history of endometrial cancer with metastasis that required robotic surgery and chemotherapy and radiation in the past  A/P: I suspect with fatigue that started a week ago and then congestion in last few days that she has a viral illness with an appropriately reactive lymph node. Since she is having fatigue and has history of endometrial cancer we opted to update labs to be on more cautious side. I usually would wait a month for referral if not improving but we discussed with history id like her to let me know in 3 weeks if not  making  any progress- consider ENT or general surgery consult for possible biopsy in that case -we also discussed doing CXR if not improving but she is enrolled already in lung cancer screening program with reassuring scan 07/13/22   Recommended follow up: Return for as needed for new, worsening, persistent symptoms. Future Appointments  Date Time Provider Department Center  12/14/2023  1:00 PM Shelva Majestic, MD [facility]   Lab/Order associations:   ICD-10-CM   1. Fatigue, unspecified type  R53.83 Comprehensive metabolic panel    CBC with Differential/Platelet    TSH    2. Lymphadenopathy, cervical  R59.0 Comprehensive metabolic panel    CBC with Differential/Platelet    TSH     Time Spent: 20 minutes of total time (1:01- 1:21 PM) was spent on the date of the encounter performing the following actions: chart review prior to seeing the patient, obtaining history, performing a medically necessary exam, counseling on the workup and potential treatment plan, placing orders, and documenting in our EHR.   Return precautions advised.  Tana Conch, MD

## 2023-03-24 ENCOUNTER — Encounter: Payer: Self-pay | Admitting: Family Medicine

## 2023-06-05 ENCOUNTER — Other Ambulatory Visit: Payer: Self-pay | Admitting: Acute Care

## 2023-06-05 DIAGNOSIS — F17210 Nicotine dependence, cigarettes, uncomplicated: Secondary | ICD-10-CM

## 2023-06-05 DIAGNOSIS — Z122 Encounter for screening for malignant neoplasm of respiratory organs: Secondary | ICD-10-CM

## 2023-06-05 DIAGNOSIS — Z87891 Personal history of nicotine dependence: Secondary | ICD-10-CM

## 2023-07-06 DIAGNOSIS — R311 Benign essential microscopic hematuria: Secondary | ICD-10-CM | POA: Diagnosis not present

## 2023-07-17 ENCOUNTER — Encounter (HOSPITAL_COMMUNITY): Payer: Self-pay

## 2023-07-17 ENCOUNTER — Ambulatory Visit (HOSPITAL_COMMUNITY)
Admission: RE | Admit: 2023-07-17 | Discharge: 2023-07-17 | Disposition: A | Discharge status: Home / Self Care | Payer: PPO | Source: Ambulatory Visit | Attending: Family Medicine | Admitting: Family Medicine

## 2023-07-17 DIAGNOSIS — F17210 Nicotine dependence, cigarettes, uncomplicated: Secondary | ICD-10-CM | POA: Diagnosis not present

## 2023-07-17 DIAGNOSIS — Z122 Encounter for screening for malignant neoplasm of respiratory organs: Secondary | ICD-10-CM | POA: Insufficient documentation

## 2023-07-17 DIAGNOSIS — Z87891 Personal history of nicotine dependence: Secondary | ICD-10-CM | POA: Diagnosis not present

## 2023-07-31 ENCOUNTER — Other Ambulatory Visit: Payer: Self-pay

## 2023-07-31 DIAGNOSIS — Z122 Encounter for screening for malignant neoplasm of respiratory organs: Secondary | ICD-10-CM

## 2023-07-31 DIAGNOSIS — F17210 Nicotine dependence, cigarettes, uncomplicated: Secondary | ICD-10-CM

## 2023-07-31 DIAGNOSIS — Z87891 Personal history of nicotine dependence: Secondary | ICD-10-CM

## 2023-08-04 DIAGNOSIS — R3129 Other microscopic hematuria: Secondary | ICD-10-CM | POA: Diagnosis not present

## 2023-08-04 DIAGNOSIS — R311 Benign essential microscopic hematuria: Secondary | ICD-10-CM | POA: Diagnosis not present

## 2023-08-04 DIAGNOSIS — R590 Localized enlarged lymph nodes: Secondary | ICD-10-CM | POA: Diagnosis not present

## 2023-08-04 DIAGNOSIS — I7143 Infrarenal abdominal aortic aneurysm, without rupture: Secondary | ICD-10-CM | POA: Diagnosis not present

## 2023-08-14 ENCOUNTER — Telehealth: Payer: Self-pay

## 2023-08-14 ENCOUNTER — Encounter: Payer: Self-pay | Admitting: Oncology

## 2023-08-14 ENCOUNTER — Encounter: Payer: Self-pay | Admitting: Hematology and Oncology

## 2023-08-14 ENCOUNTER — Inpatient Hospital Stay: Payer: PPO | Attending: Hematology and Oncology | Admitting: Hematology and Oncology

## 2023-08-14 VITALS — BP 111/82 | HR 113 | Temp 98.9°F | Resp 16 | Ht 63.0 in | Wt 117.6 lb

## 2023-08-14 DIAGNOSIS — Z808 Family history of malignant neoplasm of other organs or systems: Secondary | ICD-10-CM | POA: Diagnosis not present

## 2023-08-14 DIAGNOSIS — Z800 Family history of malignant neoplasm of digestive organs: Secondary | ICD-10-CM | POA: Diagnosis not present

## 2023-08-14 DIAGNOSIS — F17210 Nicotine dependence, cigarettes, uncomplicated: Secondary | ICD-10-CM | POA: Insufficient documentation

## 2023-08-14 DIAGNOSIS — Z90710 Acquired absence of both cervix and uterus: Secondary | ICD-10-CM | POA: Diagnosis not present

## 2023-08-14 DIAGNOSIS — R3129 Other microscopic hematuria: Secondary | ICD-10-CM | POA: Insufficient documentation

## 2023-08-14 DIAGNOSIS — Z90722 Acquired absence of ovaries, bilateral: Secondary | ICD-10-CM | POA: Diagnosis not present

## 2023-08-14 DIAGNOSIS — Z87440 Personal history of urinary (tract) infections: Secondary | ICD-10-CM | POA: Diagnosis not present

## 2023-08-14 DIAGNOSIS — Z8542 Personal history of malignant neoplasm of other parts of uterus: Secondary | ICD-10-CM | POA: Diagnosis not present

## 2023-08-14 DIAGNOSIS — F419 Anxiety disorder, unspecified: Secondary | ICD-10-CM | POA: Diagnosis not present

## 2023-08-14 DIAGNOSIS — R634 Abnormal weight loss: Secondary | ICD-10-CM | POA: Diagnosis not present

## 2023-08-14 DIAGNOSIS — Z923 Personal history of irradiation: Secondary | ICD-10-CM | POA: Diagnosis not present

## 2023-08-14 DIAGNOSIS — C541 Malignant neoplasm of endometrium: Secondary | ICD-10-CM

## 2023-08-14 DIAGNOSIS — R319 Hematuria, unspecified: Secondary | ICD-10-CM

## 2023-08-14 DIAGNOSIS — G620 Drug-induced polyneuropathy: Secondary | ICD-10-CM | POA: Insufficient documentation

## 2023-08-14 DIAGNOSIS — Z9221 Personal history of antineoplastic chemotherapy: Secondary | ICD-10-CM | POA: Insufficient documentation

## 2023-08-14 DIAGNOSIS — R590 Localized enlarged lymph nodes: Secondary | ICD-10-CM | POA: Diagnosis not present

## 2023-08-14 NOTE — Assessment & Plan Note (Addendum)
 I have reviewed recent CT imaging with the patient, in comparison with her prior imaging from 2022 There are abnormal lymphadenopathy and possibly peritoneal disease seen on her most recent imaging from January 2025 Given her high risk disease, recurrent endometrial cancer is a possibility Unfortunately, the abnormal lymph nodes are in locations that are not accessible from conventional CT-guided biopsy and the proximity to the aorta preclude safe biopsy I recommend PET/CT imaging for further evaluation; it could guide in decision making If the lymphadenopathy are PET avid, we will further support the diagnosis of cancer recurrence PET/CT imaging my reveal other sites that might be more accessible for biopsy I will see her back after PET/CT imaging is available We will also reach out to [facility] pathology department to send her specimen from 2017 for molecular testing to see if she would qualify for other targeted treatment in the future

## 2023-08-14 NOTE — Assessment & Plan Note (Signed)
 She had a history of recurrent hematuria in the past Recent urinalysis and culture confirmed urinary tract infection and after another course of antibiotics, her symptom has resolved

## 2023-08-14 NOTE — Telephone Encounter (Signed)
 Called and left a message for Jacqueline Hammond offering appt today at 2 pm or Thursday at 2:45 pm.

## 2023-08-14 NOTE — Telephone Encounter (Signed)
 Returned call to [place]. Spoke with husband and scheduled appt with Dr. Bertis Ruddy at 2 pm today. They are aware of appt.

## 2023-08-14 NOTE — Assessment & Plan Note (Signed)
 Her recent changes in appetite, sensation of bloating and abnormal weight loss are worrisome for signs of cancer recurrence As above, we will order PET/CT imaging for further evaluation

## 2023-08-14 NOTE — Progress Notes (Signed)
 [facility] FOLLOW-UP progress notes  Patient Care Team: Katrinka Garnette KIDD, MD as PCP - General (Family Medicine)  CHIEF COMPLAINTS/PURPOSE OF VISIT:  Abnormal CT imaging, worrisome for recurrent cancer  HISTORY OF PRESENTING ILLNESS:  Jacqueline Hammond Shoulder 73 y.o. female was transferred to my care after her prior physician has left.  I received a referral from urologist office today The patient underwent CT imaging of the abdomen and pelvis for evaluation of microscopic hematuria. She was recently found to have microscopic hematuria and was treated with a course of antibiotics Due to her other worrisome symptoms, CT imaging was ordered On August 04, 2023, she underwent CT imaging of the abdomen and pelvis which showed new upper abdominal lymphadenopathy in the gastrohepatic ligament, porta hepatis, and portacaval space.  There is also some lymphadenopathy in the small bowel mesentery.  Today, she verified that she has indeed been feeling symptomatic from possible recurrent disease The patient is symptomatic with recent abnormal changes to her appetite, fatigue, skin itching, sensation of feeling bloated, constipated as well as abnormal weight loss She is functional.  She forces herself to eat 3 meals a day. She denies recent nausea or vomiting.  Denies recent vaginal bleeding. She has some residual peripheral neuropathy from prior chemotherapy  I reviewed the patient's records extensive and collaborated the history with the patient. 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 Immunohistochemical stains confirmed serous carcinoma, ER and PR  negative.    11/09/2015 Initial Diagnosis   Patient presented to Dr Rockney as new patient 10-01-15 after noticing recent slight vaginal spotting. PAP had atypical glandular cells, then sonohysterogram and endometrial biopsy on 10-19-15. The uterus was 7.4 x 3.5 x 3.0 cm with endometrial stripe of 6.8 mm.  Endometrial biopsy ([id number] from [facility]) showed endometrial carcinoma which appeared high grade with serous features. She was seen by Dr Michaelene on 10-23-15, exam not remarkable. She had CT CAP 10-28-15 with no evidence of metastatic disease, incidental atherosclerosis and ectatic abdominal aorta. She had surgery at [facility] by Dr Norleen Commons on 10-29-15, which was robotic hysterectomy BSO with pelvic and paraaortic sentinel node evaluation. Intraoperative findings were of small uterus with normal tubes and ovaries. Adhesions of bladder due to uterine fundus from prior surgery, bilateral pelvic sentinel pelvic mapping to left obturator space node and to right external iliac node, 3 right paraaortic nodes mapped as sentinel nodes, normal upper abdominal survey.    11/20/2015 - 04/21/2016 Chemotherapy    First carboplatin  taxol  given 11-20-15, leukopenic with counts not at nadir day 11 cycle 1, ANC 1.2, given granix  x 1. She did not have further gCSF until documented neutropenia day 15 cycle 3 with ANC 0.2. Pelvic IMRT was given in sandwich fashion after first 3 cycles of chemo, completed 02-23-16; she additionally had vaginal brachytherapy. Chemo resumed with cycle 4 carbo taxol  on 03-03-16. HDR x 3 on 03-08-16, 03-17-16, 03-24-16. Cycle 5 delayed x 1 week with thrombocytopenia, carbo dose decreased then. Cycle 6 given 04-21-16.    04/16/2021 Imaging   CT imaging of the abdomen and pelvis showed No evidence of recurrent or metastatic carcinoma within the abdomen or pelvis.   3.2 cm  infrarenal abdominal aortic aneurysm, slightly increased in size from 3.0 cm on prior exam. Recommend follow-up every 3  years.   08/04/2023 Imaging   CT imaging done at the urologist office showed new upper abdominal, retroperitoneal and mesenteric lymphadenopathy consistent with metastatic disease.     MEDICAL HISTORY:  Past Medical History:  Diagnosis Date   Adenocarcinoma of endometrium (HCC) 09/2015   Allergy    mild seasonal   Anxiety    Atypical glandular cells of undetermined significance (AGUS) on cervical Pap smear 09/2015   GERD (gastroesophageal reflux disease)    Heart murmur    as teenager but not mentioned since   History of chemotherapy    completed nov 2017   History of radiation therapy 01/14/2016-02/23/2016   pelvis 45 Gy in 25 fx   History of radiation therapy 03/08/2016, 03/17/2016, 03/24/2016   vaginal cuff 18 Gy in 3 fx   Hyperlipidemia    Osteopenia 09/2015   T score -1.4 FRAX 8.5%/0.8%    SURGICAL HISTORY: Past Surgical History:  Procedure Laterality Date   COLONOSCOPY     IR GENERIC HISTORICAL  03/02/2016   IR US  GUIDE VASC ACCESS RIGHT 03/02/2016 Toribio Faes, MD [facility]   IR GENERIC HISTORICAL  03/02/2016   IR FLUORO GUIDE CV LINE RIGHT 03/02/2016 Toribio Faes, MD [facility]   LAPAROSCOPY     MOHS SURGERY  2024   2023-2024 3 leg procedure, 2 arm, & face-1   POLYPECTOMY     ROBOTIC ASSISTED TOTAL HYSTERECTOMY WITH BILATERAL SALPINGO OOPHERECTOMY  10/29/2015   robotic hysterectomy, BSO and sentinel lymph node biopsy of pelvic and PA nodes at [facility] with Dr Ivery.    TUBAL LIGATION      SOCIAL HISTORY: Social History   Socioeconomic History   Marital status: Married    Spouse name: Not on file   Number of children: 2   Years of education: Not on file   Highest education level: Not on file  Occupational History   Not on file  Tobacco Use   Smoking status: Some Days    Current packs/day: 1.00    Average packs/day: 1 pack/day for 32.0 years (32.0 ttl pk-yrs)    Types: Cigarettes   Smokeless tobacco: Never   Tobacco comments:    3 cigarettes/ day as of  06-30-16  Vaping Use   Vaping status: Never Used  Substance and Sexual Activity   Alcohol use: No    Alcohol/week: 0.0 standard drinks of alcohol   Drug use: No   Sexual activity: Not Currently    Birth control/protection: Post-menopausal, Surgical    Comment: 1st intercourse 73 yo-1 partner  Other Topics Concern   Not on file  Social History Narrative   Married 1970 (Norleen is a patient here). 1 living son (1 son died of esophageal cancer- had daughter Katelyn and they were closed). 3 grandchildren. 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[employer], owned health and safety inspector)      Hobbies: travel, psychologist, clinical sports, yardwork, curator, sewing, crafts with grandkids, agricultural consultant         Social Drivers of Corporate Investment Banker Strain: Not on [employer] Insecurity: Not on file  Transportation Needs: Not on file  Physical Activity: Not on file  Stress: Not on file  Social Connections: Not on file  Intimate Partner Violence: Not on file    FAMILY HISTORY: Family History  Problem Relation Age  of Onset   Cancer Mother        larynx   Heart failure Father    Heart attack Father 87       smoker, war vet   Melanoma Father    Heart attack Brother 60   Colon polyps Brother    Colon cancer Paternal Grandmother 31   Colon polyps Sister    Esophageal cancer Son    Rectal cancer Neg Hx    Stomach cancer Neg Hx     ALLERGIES:  is allergic to codeine sulfate and neomycin-bacitracin zn-polymyx.  MEDICATIONS:  Current Outpatient Medications  Medication Sig Dispense Refill   atorvastatin  (LIPITOR) 20 MG tablet TAKE 1 TABLET(20 MG) BY MOUTH DAILY 90 tablet 3   b complex vitamins capsule Take 1 capsule by mouth daily.     Multiple Vitamin (MULTIVITAMIN) tablet Take 1 tablet by mouth daily.     traZODone  (DESYREL ) 50 MG tablet Take 0.5-1 tablets (25-50 mg total) by mouth at bedtime as needed for sleep. 90 tablet 3   VITAMIN D  PO Take by mouth.     No current facility-administered medications for this visit.    REVIEW OF SYSTEMS:   Constitutional: Denies fevers, chills or abnormal night sweats Eyes: Denies blurriness of vision, double vision or watery eyes Ears, nose, mouth, throat, and face: Denies mucositis or sore throat Respiratory: Denies cough, dyspnea or wheezes Cardiovascular: Denies palpitation, chest discomfort or lower extremity swelling Skin: Denies abnormal skin rashes Lymphatics: Denies new lymphadenopathy or easy bruising Neurological:Denies numbness, tingling or new weaknesses Behavioral/Psych: Mood is stable, no new changes  All other systems were reviewed with the patient and are negative.  PHYSICAL EXAMINATION: ECOG PERFORMANCE STATUS: 1 - Symptomatic but completely ambulatory  Vitals:   08/14/23 1406  BP: 111/82  Pulse: (!) 113  Resp: 16  Temp: 98.9 F (37.2 C)  SpO2: 99%   Filed Weights   08/14/23 1406  Weight: 117 lb 9.6 oz (53.3 kg)    GENERAL:alert, no distress and comfortable.  She looks thin and cachectic SKIN: skin color, texture, turgor are normal, no rashes or significant lesions EYES: normal, conjunctiva are pink and non-injected, sclera clear OROPHARYNX:no exudate, normal lips, buccal mucosa, and tongue  NECK: supple, thyroid  normal size, non-tender, without nodularity LYMPH:  no palpable lymphadenopathy in the cervical, axillary or inguinal LUNGS: clear to auscultation and percussion with normal breathing effort HEART: regular rate & rhythm and no murmurs without lower extremity edema ABDOMEN:abdomen soft, non-tender and normal bowel sounds Musculoskeletal:no cyanosis of digits and no clubbing  PSYCH: alert & oriented x 3 with fluent speech NEURO: no focal motor/sensory deficits  LABORATORY DATA:  I have reviewed the data as listed Lab Results  Component Value Date   WBC 7.2 03/03/2023   HGB 13.2 03/03/2023   HCT 39.8 03/03/2023   MCV 107.0 (H)  03/03/2023   PLT 226.0 03/03/2023   Recent Labs    12/12/22 1514 03/03/23 1327  NA 142 139  K 4.3 3.5  CL 104 104  CO2 30 28  GLUCOSE 94 108*  BUN 13 10  CREATININE 1.26* 0.97  CALCIUM  10.0 9.9  PROT 6.8 7.2  ALBUMIN 4.2 4.1  AST 19 19  ALT 15 14  ALKPHOS 87 87  BILITOT 0.4 0.3    RADIOGRAPHIC STUDIES: I personally reviewed her recent CT imaging of the abdomen and pelvis from January I have personally reviewed the radiological images as listed and agreed with the findings in the  report. CT CHEST LUNG CA SCREEN LOW DOSE W/O CM Result Date: 07/29/2023 CLINICAL DATA:  73 year old female current smoker with 61 pack-year history of smoking. Lung cancer screening examination. EXAM: CT CHEST WITHOUT CONTRAST LOW-DOSE FOR LUNG CANCER SCREENING TECHNIQUE: Multidetector CT imaging of the chest was performed following the standard protocol without IV contrast. RADIATION DOSE REDUCTION: This exam was performed according to the departmental dose-optimization program which includes automated exposure control, adjustment of the mA and/or kV according to patient size and/or use of iterative reconstruction technique. COMPARISON:  Low-dose lung cancer screening chest CT 07/13/2022. FINDINGS: Cardiovascular: Heart size is normal. There is no significant pericardial fluid, thickening or pericardial calcification. Aortic atherosclerosis. No definite coronary artery calcifications. Mediastinum/Nodes: No pathologically enlarged mediastinal or hilar lymph nodes. Please note that accurate exclusion of hilar adenopathy is limited on noncontrast CT scans. Esophagus is unremarkable in appearance. No axillary lymphadenopathy. Lungs/Pleura: Multiple small pulmonary nodules are again noted in the lungs bilaterally, largest of which is in the medial aspect of the right upper lobe (axial image 84), with a volume derived mean diameter of 3.1 mm. No other larger more suspicious appearing pulmonary nodules or masses are  noted. No acute consolidative airspace disease. No pleural effusions. Mild diffuse bronchial wall thickening with mild centrilobular and paraseptal emphysema. Upper Abdomen: Aortic atherosclerosis. Musculoskeletal: There are no aggressive appearing lytic or blastic lesions noted in the visualized portions of the skeleton. IMPRESSION: 1. Lung-RADS 2, benign appearance or behavior. Continue annual screening with low-dose chest CT without contrast in 12 months. 2. Aortic atherosclerosis. 3. Mild diffuse bronchial wall thickening with mild centrilobular and paraseptal emphysema; imaging findings suggestive of underlying COPD. Aortic Atherosclerosis (ICD10-I70.0) and Emphysema (ICD10-J43.9). Electronically Signed   By: Toribio Aye M.D.   On: 07/29/2023 11:44    ASSESSMENT & PLAN:  Endometrial cancer (HCC) I have reviewed recent CT imaging with the patient, in comparison with her prior imaging from 2022 There are abnormal lymphadenopathy and possibly peritoneal disease seen on her most recent imaging from January 2025 Given her high risk disease, recurrent endometrial cancer is a possibility Unfortunately, the abnormal lymph nodes are in locations that are not accessible from conventional CT-guided biopsy and the proximity to the aorta preclude safe biopsy I recommend PET/CT imaging for further evaluation; it could guide in decision making If the lymphadenopathy are PET avid, we will further support the diagnosis of cancer recurrence PET/CT imaging my reveal other sites that might be more accessible for biopsy I will see her back after PET/CT imaging is available We will also reach out to [facility] pathology department to send her specimen from 2017 for molecular testing to see if she would qualify for other targeted treatment in the future  Hematuria She had a history of recurrent hematuria in the past Recent urinalysis and culture confirmed urinary tract infection and after another course of antibiotics,  her symptom has resolved  Weight loss, abnormal Her recent changes in appetite, sensation of bloating and abnormal weight loss are worrisome for signs of cancer recurrence As above, we will order PET/CT imaging for further evaluation  Orders Placed This Encounter  Procedures   NM PET Image Initial (PI) Skull Base To Thigh (F-18 FDG)    Standing Status:   Future    Expected Date:   08/21/2023    Expiration Date:   08/13/2024    If indicated for the ordered procedure, I authorize the administration of a radiopharmaceutical per Radiology protocol:   Yes  Preferred imaging location?:   [facility]    All questions were answered. The patient knows to call the clinic with any problems, questions or concerns. The total time spent in the appointment was 60 minutes encounter with patients including review of chart and various tests results, discussions about plan of care and coordination of care plan   Almarie Bedford, MD 08/14/2023 3:30 PM

## 2023-08-14 NOTE — Progress Notes (Signed)
 Requested [facility] testing with [facility] per Dr. Bertis Ruddy on accession [id number].

## 2023-08-15 ENCOUNTER — Telehealth: Payer: Self-pay

## 2023-08-15 NOTE — Telephone Encounter (Signed)
 Called and scheduled appt on 1/23. She is aware of appt.

## 2023-08-16 ENCOUNTER — Encounter: Payer: Self-pay | Admitting: Urology

## 2023-08-18 ENCOUNTER — Ambulatory Visit (HOSPITAL_COMMUNITY)
Admission: RE | Admit: 2023-08-18 | Discharge: 2023-08-18 | Disposition: A | Discharge status: Home / Self Care | Payer: PPO | Source: Ambulatory Visit | Attending: Hematology and Oncology | Admitting: Hematology and Oncology

## 2023-08-18 DIAGNOSIS — C541 Malignant neoplasm of endometrium: Secondary | ICD-10-CM | POA: Insufficient documentation

## 2023-08-18 DIAGNOSIS — R918 Other nonspecific abnormal finding of lung field: Secondary | ICD-10-CM | POA: Diagnosis not present

## 2023-08-18 LAB — GLUCOSE, CAPILLARY: Glucose-Capillary: 103 mg/dL — ABNORMAL HIGH (ref 70–99)

## 2023-08-24 ENCOUNTER — Inpatient Hospital Stay: Payer: PPO | Admitting: Hematology and Oncology

## 2023-08-24 ENCOUNTER — Encounter: Payer: Self-pay | Admitting: Hematology and Oncology

## 2023-08-24 VITALS — BP 115/83 | HR 103 | Temp 98.3°F | Resp 18 | Ht 63.0 in | Wt 118.2 lb

## 2023-08-24 DIAGNOSIS — C541 Malignant neoplasm of endometrium: Secondary | ICD-10-CM

## 2023-08-24 DIAGNOSIS — R4589 Other symptoms and signs involving emotional state: Secondary | ICD-10-CM | POA: Diagnosis not present

## 2023-08-24 DIAGNOSIS — R590 Localized enlarged lymph nodes: Secondary | ICD-10-CM | POA: Diagnosis not present

## 2023-08-24 MED ORDER — MIRTAZAPINE 7.5 MG PO TABS: 7.5000 mg | ORAL_TABLET | Freq: Every day | ORAL | 1 refills | Status: DC

## 2023-08-24 NOTE — Assessment & Plan Note (Signed)
She has some mild anxiety She also have trouble sleeping at night at times She felt that trazodone was too sedating We discussed risk and benefits of trial of mirtazapine and she is in agreement to try

## 2023-08-24 NOTE — Progress Notes (Signed)
   Per Dr. Archer Asa :  Approved for US guided core biopsy of left posterior cervical LN.

## 2023-08-24 NOTE — Assessment & Plan Note (Signed)
We spent a lot of time reviewing PET/CT imaging The presence of abnormal lymphadenopathy on the left supraclavicular region is likely linked with intra-abdominal lymphadenopathy Her cancer diagnosis was many years ago and it is prudent that we obtain new biopsy to confirm cancer relapse versus other new pathology I will reach out to interventional radiologist to see if ultrasound-guided core biopsy of the left supraclavicular lymph nodes would be appropriate modality I will see her back within the week after biopsy to discuss test results and next step

## 2023-08-24 NOTE — Progress Notes (Signed)
[facility] OFFICE PROGRESS NOTE  Patient Care Team: Shelva Majestic, MD as PCP - General (Family Medicine) Crista Elliot, MD as Referring Physician (Urology)  ASSESSMENT & PLAN:  Endometrial cancer [facility]) We spent a lot of time reviewing PET/CT imaging The presence of abnormal lymphadenopathy on the left supraclavicular region is likely linked with intra-abdominal lymphadenopathy Her cancer diagnosis was many years ago and it is prudent that we obtain new biopsy to confirm cancer relapse versus other new pathology I will reach out to interventional radiologist to see if ultrasound-guided core biopsy of the left supraclavicular lymph nodes would be appropriate modality I will see her back within the week after biopsy to discuss test results and next step  Anxiety about health She has some mild anxiety She also have trouble sleeping at night at times She felt that trazodone was too sedating We discussed risk and benefits of trial of mirtazapine and she is in agreement to try  Orders Placed This Encounter  Procedures   [place] CORE BIOPSY (LYMPH NODES)    D/W Dr. Archer Asa    Standing Status:   Future    Expected Date:   08/30/2023    Expiration Date:   08/23/2024    Lab orders requested (DO NOT place separate lab orders, these will be automatically ordered during procedure specimen collection)::   Surgical Pathology    Reason for Exam (SYMPTOM  OR DIAGNOSIS REQUIRED):   metastatic cancer eval, palpable LN left supravicular, hx endometrial cancer    Preferred location?:   [facility]    All questions were answered. The patient knows to call the clinic with any problems, questions or concerns. The total time spent in the appointment was 30 minutes encounter with patients including review of chart and various tests results, discussions about plan of care and coordination of care plan   Artis Delay, MD 08/24/2023 1:24 PM  INTERVAL HISTORY: Please see below for  problem oriented charting. she returns for review test results She appears somewhat anxious She is asking for medication to help She has increased her oral protein intake We spent a lot of time reviewing imaging studies and plan of care  REVIEW OF SYSTEMS:   Constitutional: Denies fevers, chills or abnormal weight loss Eyes: Denies blurriness of vision Ears, nose, mouth, throat, and face: Denies mucositis or sore throat Respiratory: Denies cough, dyspnea or wheezes Cardiovascular: Denies palpitation, chest discomfort or lower extremity swelling Gastrointestinal:  Denies nausea, heartburn or change in bowel habits Skin: Denies abnormal skin rashes Neurological:Denies numbness, tingling or new weaknesses Behavioral/Psych: Mood is stable, no new changes  All other systems were reviewed with the patient and are negative.  I have reviewed the past medical history, past surgical history, social history and family history with the patient and they are unchanged from previous note.  ALLERGIES:  is allergic to codeine sulfate and neomycin-bacitracin zn-polymyx.  MEDICATIONS:  Current Outpatient Medications  Medication Sig Dispense Refill   mirtazapine (REMERON) 7.5 MG tablet Take 1 tablet (7.5 mg total) by mouth at bedtime. 30 tablet 1   atorvastatin (LIPITOR) 20 MG tablet TAKE 1 TABLET(20 MG) BY MOUTH DAILY 90 tablet 3   b complex vitamins capsule Take 1 capsule by mouth daily.     Multiple Vitamin (MULTIVITAMIN) tablet Take 1 tablet by mouth daily.     traZODone (DESYREL) 50 MG tablet Take 0.5-1 tablets (25-50 mg total) by mouth at bedtime as needed for sleep. 90 tablet 3  VITAMIN D PO Take by mouth.     No current facility-administered medications for this visit.    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 Immunohistochemical stains confirmed serous carcinoma, ER and PR negative.    11/09/2015 Initial Diagnosis   Patient presented to Dr Audie Box as new patient 10-01-15 after noticing recent slight vaginal spotting. PAP had atypical glandular cells, then sonohysterogram and endometrial biopsy on 10-19-15. The uterus was 7.4 x 3.5 x 3.0 cm with endometrial stripe of 6.8 mm.  Endometrial biopsy ([id number] from [facility]) showed endometrial carcinoma which appeared high grade with serous features. She was seen by Dr Yolande Jolly on 10-23-15, exam not remarkable. She had CT CAP 10-28-15 with no evidence of metastatic disease, incidental atherosclerosis and ectatic abdominal aorta. She had surgery at [facility] by Dr Ronita Hipps on 10-29-15, which was robotic hysterectomy BSO with pelvic and paraaortic sentinel node evaluation. Intraoperative findings were of small uterus with normal tubes and ovaries. Adhesions of bladder due to uterine fundus from prior surgery, bilateral pelvic sentinel pelvic mapping to left obturator space node and to right external iliac node, 3 right paraaortic nodes mapped as sentinel nodes, normal upper abdominal survey.    11/20/2015 - 04/21/2016 Chemotherapy    First carboplatin taxol given 11-20-15, leukopenic with counts not at nadir day 11 cycle 1, ANC 1.2, given granix x 1. She did not have further gCSF until documented neutropenia day 15 cycle 3 with ANC 0.2. Pelvic IMRT was given in sandwich fashion after first 3 cycles of chemo, completed 02-23-16; she additionally had vaginal brachytherapy. Chemo resumed with cycle 4 carbo taxol on 03-03-16. HDR x 3 on 03-08-16, 03-17-16, 03-24-16. Cycle 5 delayed x 1 week with thrombocytopenia, carbo dose decreased then. Cycle 6 given 04-21-16.    04/16/2021 Imaging   CT imaging of the abdomen and  pelvis showed No evidence of recurrent or metastatic carcinoma within the abdomen or pelvis.   3.2 cm infrarenal abdominal aortic aneurysm, slightly increased in size from 3.0 cm on prior exam. Recommend follow-up every 3 years.   08/04/2023 Imaging   CT imaging done at the urologist office showed new upper abdominal, retroperitoneal and mesenteric lymphadenopathy consistent with metastatic disease.   08/18/2023 PET scan   NM PET Image Initial (PI) Skull Base To Thigh (F-18 FDG) Result Date: 08/23/2023 CLINICAL DATA:  Initial treatment strategy for recurrent endometrial cancer. EXAM: NUCLEAR MEDICINE PET SKULL BASE TO THIGH TECHNIQUE: 6.4 mCi F-18 FDG was injected intravenously. Full-ring PET imaging was performed from the skull base to thigh after the radiotracer. CT data was obtained and used for attenuation correction and anatomic localization. Fasting blood glucose: 103 mg/dl COMPARISON:  CT exams from 08/04/2023 and 07/17/2023 FINDINGS: Mediastinal blood pool activity: SUV max 2.0 Liver activity: SUV max NA NECK: Two hypermetabolic left level V lymph nodes are present on image 37 36 of series 4. The larger is more posterior in measures 0.9 cm in short axis on image 37 series 4 with maximum SUV 6.4. Incidental CT findings: Bilateral common carotid atheromatous vascular calcification. CHEST: Hypermetabolic prevascular, paraesophageal, and left axillary adenopathy. Index left axillary node 1.0 cm in short axis on image 56 series 4 with maximum SUV  8.3. Clustered and potentially faintly calcified prevascular lymph nodes measuring up to 0.8 cm in short axis with maximum SUV 4.5. Incidental CT findings: Atheromatous vascular calcification of the thoracic aorta and branch vessels. Airway thickening is present, suggesting bronchitis or reactive airways disease. ABDOMEN/PELVIS: Hypermetabolic right gastric, peripancreatic, periaortic, index mesenteric lymph node 0.7 cm in short axis on image 123 series 4 with  maximum SUV 8.2. No hypermetabolic lymph nodes in the pelvis. Porta hepatis, mesenteric adenopathy. Index right gastric node 1.3 cm in short axis on image 100 series 4, maximum SUV 9.6. Incidental CT findings: Infrarenal abdominal aortic aneurysm 3.3 cm in maximum diameter on image 118 series 4, stable. Recommend surveillance ultrasound in 3 years. Reference: Journal of Vascular Surgery 67.1 (2018): 2-77. J Am Coll Radiol [phone]. SKELETON: No significant abnormal hypermetabolic activity in this region. Incidental CT findings: None. IMPRESSION: 1. Hypermetabolic adenopathy in the left lower neck, chest, and abdomen, compatible with active malignancy. 2. Infrarenal abdominal aortic aneurysm measuring 3.3 cm in maximum diameter. Recommend surveillance ultrasound in 3 years. 3. Airway thickening is present, suggesting bronchitis or reactive airways disease. 4. Aortic atherosclerosis. Aortic Atherosclerosis (ICD10-I70.0). Electronically Signed   By: Gaylyn Rong M.D.   On: 08/23/2023 17:33        PHYSICAL EXAMINATION: ECOG PERFORMANCE STATUS: 1 - Symptomatic but completely ambulatory  Vitals:   08/24/23 1106  BP: 115/83  Pulse: (!) 103  Resp: 18  Temp: 98.3 F (36.8 C)  SpO2: 100%   Filed Weights   08/24/23 1106  Weight: 118 lb 3.2 oz (53.6 kg)    GENERAL:alert, no distress and comfortable She has palpable left supraclavicular lymph nodes on exam but the left axillary lymph node is not palpable  LABORATORY DATA:  I have reviewed the data as listed    Component Value Date/Time   NA 139 03/03/2023 1327   NA 142 06/30/2016 1348   K 3.5 03/03/2023 1327   K 3.4 (L) 06/30/2016 1348   CL 104 03/03/2023 1327   CO2 28 03/03/2023 1327   CO2 28 06/30/2016 1348   GLUCOSE 108 (H) 03/03/2023 1327   GLUCOSE 112 06/30/2016 1348   BUN 10 03/03/2023 1327   BUN 8.7 06/30/2016 1348   CREATININE 0.97 03/03/2023 1327   CREATININE 1.32 (H) 05/07/2020 1519   CREATININE 0.8 06/30/2016  1348   CALCIUM 9.9 03/03/2023 1327   CALCIUM 10.0 06/30/2016 1348   PROT 7.2 03/03/2023 1327   PROT 6.7 06/30/2016 1348   ALBUMIN 4.1 03/03/2023 1327   ALBUMIN 3.6 06/30/2016 1348   AST 19 03/03/2023 1327   AST 17 06/30/2016 1348   ALT 14 03/03/2023 1327   ALT 12 06/30/2016 1348   ALKPHOS 87 03/03/2023 1327   ALKPHOS 88 06/30/2016 1348   BILITOT 0.3 03/03/2023 1327   BILITOT 0.31 06/30/2016 1348   GFRNONAA 52 (L) 04/12/2021 1134   GFRNONAA 41 (L) 05/07/2020 1519   GFRAA 48 (L) 05/07/2020 1519    No results found for: "SPEP", "UPEP"  Lab Results  Component Value Date   WBC 7.2 03/03/2023   NEUTROABS 5.1 03/03/2023   HGB 13.2 03/03/2023   HCT 39.8 03/03/2023   MCV 107.0 (H) 03/03/2023   PLT 226.0 03/03/2023      Chemistry      Component Value Date/Time   NA 139 03/03/2023 1327   NA 142 06/30/2016 1348   K 3.5 03/03/2023 1327   K 3.4 (L) 06/30/2016 1348   CL 104 03/03/2023 1327  CO2 28 03/03/2023 1327   CO2 28 06/30/2016 1348   BUN 10 03/03/2023 1327   BUN 8.7 06/30/2016 1348   CREATININE 0.97 03/03/2023 1327   CREATININE 1.32 (H) 05/07/2020 1519   CREATININE 0.8 06/30/2016 1348      Component Value Date/Time   CALCIUM 9.9 03/03/2023 1327   CALCIUM 10.0 06/30/2016 1348   ALKPHOS 87 03/03/2023 1327   ALKPHOS 88 06/30/2016 1348   AST 19 03/03/2023 1327   AST 17 06/30/2016 1348   ALT 14 03/03/2023 1327   ALT 12 06/30/2016 1348   BILITOT 0.3 03/03/2023 1327   BILITOT 0.31 06/30/2016 1348       RADIOGRAPHIC STUDIES: I have reviewed multiple imaging studies with the patient and her husband I have personally reviewed the radiological images as listed and agreed with the findings in the report. NM PET Image Initial (PI) Skull Base To Thigh (F-18 FDG) Result Date: 08/23/2023 CLINICAL DATA:  Initial treatment strategy for recurrent endometrial cancer. EXAM: NUCLEAR MEDICINE PET SKULL BASE TO THIGH TECHNIQUE: 6.4 mCi F-18 FDG was injected intravenously. Full-ring  PET imaging was performed from the skull base to thigh after the radiotracer. CT data was obtained and used for attenuation correction and anatomic localization. Fasting blood glucose: 103 mg/dl COMPARISON:  CT exams from 08/04/2023 and 07/17/2023 FINDINGS: Mediastinal blood pool activity: SUV max 2.0 Liver activity: SUV max NA NECK: Two hypermetabolic left level V lymph nodes are present on image 37 36 of series 4. The larger is more posterior in measures 0.9 cm in short axis on image 37 series 4 with maximum SUV 6.4. Incidental CT findings: Bilateral common carotid atheromatous vascular calcification. CHEST: Hypermetabolic prevascular, paraesophageal, and left axillary adenopathy. Index left axillary node 1.0 cm in short axis on image 56 series 4 with maximum SUV 8.3. Clustered and potentially faintly calcified prevascular lymph nodes measuring up to 0.8 cm in short axis with maximum SUV 4.5. Incidental CT findings: Atheromatous vascular calcification of the thoracic aorta and branch vessels. Airway thickening is present, suggesting bronchitis or reactive airways disease. ABDOMEN/PELVIS: Hypermetabolic right gastric, peripancreatic, periaortic, index mesenteric lymph node 0.7 cm in short axis on image 123 series 4 with maximum SUV 8.2. No hypermetabolic lymph nodes in the pelvis. Porta hepatis, mesenteric adenopathy. Index right gastric node 1.3 cm in short axis on image 100 series 4, maximum SUV 9.6. Incidental CT findings: Infrarenal abdominal aortic aneurysm 3.3 cm in maximum diameter on image 118 series 4, stable. Recommend surveillance ultrasound in 3 years. Reference: Journal of Vascular Surgery 67.1 (2018): 2-77. J Am Coll Radiol [phone]. SKELETON: No significant abnormal hypermetabolic activity in this region. Incidental CT findings: None. IMPRESSION: 1. Hypermetabolic adenopathy in the left lower neck, chest, and abdomen, compatible with active malignancy. 2. Infrarenal abdominal aortic aneurysm  measuring 3.3 cm in maximum diameter. Recommend surveillance ultrasound in 3 years. 3. Airway thickening is present, suggesting bronchitis or reactive airways disease. 4. Aortic atherosclerosis. Aortic Atherosclerosis (ICD10-I70.0). Electronically Signed   By: Gaylyn Rong M.D.   On: 08/23/2023 17:33

## 2023-08-29 DIAGNOSIS — D414 Neoplasm of uncertain behavior of bladder: Secondary | ICD-10-CM | POA: Diagnosis not present

## 2023-08-29 DIAGNOSIS — R311 Benign essential microscopic hematuria: Secondary | ICD-10-CM | POA: Diagnosis not present

## 2023-08-29 DIAGNOSIS — C775 Secondary and unspecified malignant neoplasm of intrapelvic lymph nodes: Secondary | ICD-10-CM | POA: Diagnosis not present

## 2023-08-29 DIAGNOSIS — C7982 Secondary malignant neoplasm of genital organs: Secondary | ICD-10-CM | POA: Diagnosis not present

## 2023-08-29 DIAGNOSIS — C541 Malignant neoplasm of endometrium: Secondary | ICD-10-CM | POA: Diagnosis not present

## 2023-08-30 DIAGNOSIS — C7982 Secondary malignant neoplasm of genital organs: Secondary | ICD-10-CM | POA: Diagnosis not present

## 2023-08-30 DIAGNOSIS — C775 Secondary and unspecified malignant neoplasm of intrapelvic lymph nodes: Secondary | ICD-10-CM | POA: Diagnosis not present

## 2023-08-30 DIAGNOSIS — C541 Malignant neoplasm of endometrium: Secondary | ICD-10-CM | POA: Diagnosis not present

## 2023-08-31 ENCOUNTER — Encounter: Payer: Self-pay | Admitting: Hematology and Oncology

## 2023-09-04 DIAGNOSIS — D225 Melanocytic nevi of trunk: Secondary | ICD-10-CM | POA: Diagnosis not present

## 2023-09-04 DIAGNOSIS — Z85828 Personal history of other malignant neoplasm of skin: Secondary | ICD-10-CM | POA: Diagnosis not present

## 2023-09-04 DIAGNOSIS — L821 Other seborrheic keratosis: Secondary | ICD-10-CM | POA: Diagnosis not present

## 2023-09-04 DIAGNOSIS — Z872 Personal history of diseases of the skin and subcutaneous tissue: Secondary | ICD-10-CM | POA: Diagnosis not present

## 2023-09-04 DIAGNOSIS — L814 Other melanin hyperpigmentation: Secondary | ICD-10-CM | POA: Diagnosis not present

## 2023-09-04 DIAGNOSIS — L570 Actinic keratosis: Secondary | ICD-10-CM | POA: Diagnosis not present

## 2023-09-04 DIAGNOSIS — Z08 Encounter for follow-up examination after completed treatment for malignant neoplasm: Secondary | ICD-10-CM | POA: Diagnosis not present

## 2023-09-05 ENCOUNTER — Telehealth: Payer: Self-pay

## 2023-09-05 NOTE — Telephone Encounter (Signed)
Returned her call. Reviewed upcoming appts and she is aware.  She is going to have a bladder biopsy with Dr. Alvester Morin next week at the surgery center on [facility].  FYI

## 2023-09-14 ENCOUNTER — Other Ambulatory Visit: Payer: Self-pay | Admitting: Radiology

## 2023-09-14 ENCOUNTER — Other Ambulatory Visit: Payer: Self-pay | Admitting: Family Medicine

## 2023-09-14 DIAGNOSIS — C541 Malignant neoplasm of endometrium: Secondary | ICD-10-CM

## 2023-09-14 NOTE — Progress Notes (Incomplete)
Chief Complaint: Endometrial cancer/lymphadenopathy; referred for left posterior cervical lymph node biopsy  Referring Provider(s): Gorsuch,N  Supervising Physician: Irish Lack  Patient Status: [facility] - Out-pt  History of Present Illness: Jacqueline Hammond is a 73 y.o. female with past medical history significant for anxiety, GERD, hyperlipidemia, osteopenia, and endometrial cancer in 2017 status post hysterectomy with BSO at [facility] along with chemoradiation.  Recent PET scan on 08/23/2023 revealed:  1. Hypermetabolic adenopathy in the left lower neck, chest, and abdomen, compatible with active malignancy. 2. Infrarenal abdominal aortic aneurysm measuring 3.3 cm in maximum diameter. Recommend surveillance ultrasound in 3 years. 3. Airway thickening is present, suggesting bronchitis or reactive airways disease. 4. Aortic atherosclerosis   She is scheduled today for ultrasound-guided left posterior cervical lymph node biopsy for further evaluation  *** Patient is Full Code  Past Medical History:  Diagnosis Date   Adenocarcinoma of endometrium (HCC) 09/2015   Allergy    mild seasonal   Anxiety    Atypical glandular cells of undetermined significance (AGUS) on cervical Pap smear 09/2015   GERD (gastroesophageal reflux disease)    Heart murmur    as teenager but not mentioned since   History of chemotherapy    completed nov 2017   History of radiation therapy 01/14/2016-02/23/2016   pelvis 45 Gy in 25 fx   History of radiation therapy 03/08/2016, 03/17/2016, 03/24/2016   vaginal cuff 18 Gy in 3 fx   Hyperlipidemia    Osteopenia 09/2015   T score -1.4 FRAX 8.5%/0.8%    Past Surgical History:  Procedure Laterality Date   COLONOSCOPY     IR GENERIC HISTORICAL  03/02/2016   IR US GUIDE VASC ACCESS RIGHT 03/02/2016 [name], MD [facility]-INTERV RAD   IR GENERIC HISTORICAL  03/02/2016   IR FLUORO GUIDE CV LINE RIGHT 03/02/2016 [name], MD [facility]INTERV RAD   LAPAROSCOPY     MOHS  SURGERY  2024   2023-2024 3 leg procedure, 2 arm, & face-1   POLYPECTOMY     ROBOTIC ASSISTED TOTAL HYSTERECTOMY WITH BILATERAL SALPINGO OOPHERECTOMY  10/29/2015   robotic hysterectomy, BSO and sentinel lymph node biopsy of pelvic and PA nodes at [facility] with Dr [name].    TUBAL LIGATION      Allergies: Codeine sulfate and Neomycin-bacitracin zn-polymyx  Medications: Prior to Admission medications   Medication Sig Start Date End Date Taking? Authorizing Provider  atorvastatin (LIPITOR) 20 MG tablet TAKE 1 TABLET(20 MG) BY MOUTH DAILY 08/04/22   Shelva Majestic, MD  b complex vitamins capsule Take 1 capsule by mouth daily.    [provider]  mirtazapine (REMERON) 7.5 MG tablet Take 1 tablet (7.5 mg total) by mouth at bedtime. 08/24/23   Artis Delay, MD  Multiple Vitamin (MULTIVITAMIN) tablet Take 1 tablet by mouth daily.    [provider]  traZODone (DESYREL) 50 MG tablet Take 0.5-1 tablets (25-50 mg total) by mouth at bedtime as needed for sleep. 12/12/22   Shelva Majestic, MD  VITAMIN D PO Take by mouth.    [provider]     Family History  Problem Relation Age of Onset   Cancer Mother        larynx   Heart failure Father    Heart attack Father 36       smoker, war vet   Melanoma Father    Heart attack Brother 52   Colon polyps Brother    Colon cancer Paternal Grandmother 35   Colon polyps  Sister    Esophageal cancer Son    Rectal cancer Neg Hx    Stomach cancer Neg Hx     Social History   Socioeconomic History   Marital status: Married    Spouse name: Not on file   Number of children: 2   Years of education: Not on file   Highest education level: Not on file  Occupational History   Not on file  Tobacco Use   Smoking status: Some Days    Current packs/day: 1.00    Average packs/day: 1 pack/day for 32.0 years (32.0 ttl pk-yrs)    Types: Cigarettes   Smokeless tobacco: Never   Tobacco comments:    3 cigarettes/ day as of 06-30-16   Vaping Use   Vaping status: Never Used  Substance and Sexual Activity   Alcohol use: No    Alcohol/week: 0.0 standard drinks of alcohol   Drug use: No   Sexual activity: Not Currently    Birth control/protection: Post-menopausal, Surgical    Comment: 1st intercourse 64 yo-1 partner  Other Topics Concern   Not on file  Social History Narrative   Married 1970 (Jonny Ruiz is a patient here). 1 living son (1 son died of esophageal cancer- had daughter Konrad Felix and they were closed). 3 grandchildren. 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[employer], owned Health and safety inspector)      Hobbies: travel, Psychologist, clinical sports, yardwork, Curator, sewing, crafts with grandkids, Agricultural consultant         Social Drivers of Corporate investment banker Strain: Not on [employer] Insecurity: Not on file  Transportation Needs: Not on file  Physical Activity: Not on file  Stress: Not on file  Social Connections: Not on file       Review of Systems  Vital Signs:   Advance Care Plan: No documents on file  Physical Exam  Imaging: NM PET Image Initial (PI) Skull Base To Thigh (F-18 FDG) Result Date: 08/23/2023 CLINICAL DATA:  Initial treatment strategy for recurrent endometrial cancer. EXAM: NUCLEAR MEDICINE PET SKULL BASE TO THIGH TECHNIQUE: 6.4 mCi F-18 FDG was injected intravenously. Full-ring PET imaging was performed from the skull base to thigh after the radiotracer. CT data was obtained and used for attenuation correction and anatomic localization. Fasting blood glucose: 103 mg/dl COMPARISON:  CT exams from 08/04/2023 and 07/17/2023 FINDINGS: Mediastinal blood pool activity: SUV max 2.0 Liver activity: SUV max NA NECK: Two hypermetabolic left level V lymph nodes are present on image 37 36 of series 4. The larger is more posterior in measures 0.9 cm in short axis on image 37 series 4 with maximum SUV 6.4. Incidental CT findings: Bilateral common carotid  atheromatous vascular calcification. CHEST: Hypermetabolic prevascular, paraesophageal, and left axillary adenopathy. Index left axillary node 1.0 cm in short axis on image 56 series 4 with maximum SUV 8.3. Clustered and potentially faintly calcified prevascular lymph nodes measuring up to 0.8 cm in short axis with maximum SUV 4.5. Incidental CT findings: Atheromatous vascular calcification of the thoracic aorta and branch vessels. Airway thickening is present, suggesting bronchitis or reactive airways disease. ABDOMEN/PELVIS: Hypermetabolic right gastric, peripancreatic, periaortic, index mesenteric lymph node 0.7 cm in short axis on image 123 series 4 with maximum SUV 8.2. No hypermetabolic lymph nodes in the pelvis. Porta hepatis, mesenteric adenopathy. Index right gastric node 1.3 cm in short axis on image 100 series 4, maximum SUV 9.6. Incidental CT findings: Infrarenal abdominal  aortic aneurysm 3.3 cm in maximum diameter on image 118 series 4, stable. Recommend surveillance ultrasound in 3 years. Reference: Journal of Vascular Surgery 67.1 (2018): 2-77. J Am Coll Radiol [id number]. SKELETON: No significant abnormal hypermetabolic activity in this region. Incidental CT findings: None. IMPRESSION: 1. Hypermetabolic adenopathy in the left lower neck, chest, and abdomen, compatible with active malignancy. 2. Infrarenal abdominal aortic aneurysm measuring 3.3 cm in maximum diameter. Recommend surveillance ultrasound in 3 years. 3. Airway thickening is present, suggesting bronchitis or reactive airways disease. 4. Aortic atherosclerosis. Aortic Atherosclerosis (ICD10-I70.0). Electronically Signed   By: Gaylyn Rong M.D.   On: 08/23/2023 17:33    Labs:  CBC: Recent Labs    12/12/22 1514 03/03/23 1327  WBC 6.4 7.2  HGB 13.5 13.2  HCT 40.1 39.8  PLT 248.0 226.0    COAGS: No results for input(s): "INR", "APTT" in the last 8760 hours.  BMP: Recent Labs    12/12/22 1514 03/03/23 1327  NA  142 139  K 4.3 3.5  CL 104 104  CO2 30 28  GLUCOSE 94 108*  BUN 13 10  CALCIUM 10.0 9.9  CREATININE 1.26* 0.97    LIVER FUNCTION TESTS: Recent Labs    12/12/22 1514 03/03/23 1327  BILITOT 0.4 0.3  AST 19 19  ALT 15 14  ALKPHOS 87 87  PROT 6.8 7.2  ALBUMIN 4.2 4.1    TUMOR MARKERS: No results for input(s): "AFPTM", "CEA", "CA199", "CHROMGRNA" in the last 8760 hours.  Assessment and Plan: 73 y.o. female with past medical history significant for anxiety, GERD, hyperlipidemia, osteopenia, and endometrial cancer in 2017 status post hysterectomy with BSO at [facility] along with chemoradiation.  She is known to IR team from PICC placement in 2017.  Recent PET scan on 08/23/2023 revealed:  1. Hypermetabolic adenopathy in the left lower neck, chest, and abdomen, compatible with active malignancy. 2. Infrarenal abdominal aortic aneurysm measuring 3.3 cm in maximum diameter. Recommend surveillance ultrasound in 3 years. 3. Airway thickening is present, suggesting bronchitis or reactive airways disease. 4. Aortic atherosclerosis   She is scheduled today for ultrasound-guided left posterior cervical lymph node biopsy for further evaluation.Risks and benefits of procedure was discussed with the patient including, but not limited to bleeding, infection, damage to adjacent structures or low yield requiring additional tests.  All of the questions were answered and there is agreement to proceed.  Consent signed and in chart.    Thank you for allowing our service to participate in Jacqueline Hammond 's care.  Electronically Signed: D. Jeananne Rama, PA-C   09/14/2023, 3:19 PM      I spent a total of  15 minutes   in face to face in clinical consultation, greater than 50% of which was counseling/coordinating care for ultrasound-guided left posterior cervical lymph node biopsy

## 2023-09-14 NOTE — Progress Notes (Incomplete)
Chief Complaint: ***  Referring Provider(s): ***  Supervising Physician: {Supervising Physician:[id number]}  Patient Status: {IR Consult Patient Status:21879}  History of Present Illness: Jacqueline Hammond is a 73 y.o. female ***  *** Patient is Full Code  Past Medical History:  Diagnosis Date   Adenocarcinoma of endometrium (HCC) 09/2015   Allergy    mild seasonal   Anxiety    Atypical glandular cells of undetermined significance (AGUS) on cervical Pap smear 09/2015   GERD (gastroesophageal reflux disease)    Heart murmur    as teenager but not mentioned since   History of chemotherapy    completed nov 2017   History of radiation therapy 01/14/2016-02/23/2016   pelvis 45 Gy in 25 fx   History of radiation therapy 03/08/2016, 03/17/2016, 03/24/2016   vaginal cuff 18 Gy in 3 fx   Hyperlipidemia    Osteopenia 09/2015   T score -1.4 FRAX 8.5%/0.8%    Past Surgical History:  Procedure Laterality Date   COLONOSCOPY     IR GENERIC HISTORICAL  03/02/2016   IR US GUIDE VASC ACCESS RIGHT 03/02/2016 [name], MD [facility]   IR GENERIC HISTORICAL  03/02/2016   IR FLUORO GUIDE CV LINE RIGHT 03/02/2016 [name], MD [facility]   LAPAROSCOPY     MOHS SURGERY  2024   2023-2024 3 leg procedure, 2 arm, & face-1   POLYPECTOMY     ROBOTIC ASSISTED TOTAL HYSTERECTOMY WITH BILATERAL SALPINGO OOPHERECTOMY  10/29/2015   robotic hysterectomy, BSO and sentinel lymph node biopsy of pelvic and PA nodes at [facility] with Dr [name].    TUBAL LIGATION      Allergies: Codeine sulfate and Neomycin-bacitracin zn-polymyx  Medications: Prior to Admission medications   Medication Sig Start Date End Date Taking? Authorizing Provider  atorvastatin (LIPITOR) 20 MG tablet TAKE 1 TABLET(20 MG) BY MOUTH DAILY 08/04/22   Shelva Majestic, MD  b complex vitamins capsule Take 1 capsule by mouth daily.    [provider]  mirtazapine (REMERON) 7.5 MG tablet Take 1 tablet (7.5 mg total) by mouth at  bedtime. 08/24/23   Artis Delay, MD  Multiple Vitamin (MULTIVITAMIN) tablet Take 1 tablet by mouth daily.    [provider]  traZODone (DESYREL) 50 MG tablet Take 0.5-1 tablets (25-50 mg total) by mouth at bedtime as needed for sleep. 12/12/22   Shelva Majestic, MD  VITAMIN D PO Take by mouth.    [provider]     Family History  Problem Relation Age of Onset   Cancer Mother        larynx   Heart failure Father    Heart attack Father 77       smoker, war vet   Melanoma Father    Heart attack Brother 33   Colon polyps Brother    Colon cancer Paternal Grandmother 39   Colon polyps Sister    Esophageal cancer Son    Rectal cancer Neg Hx    Stomach cancer Neg Hx     Social History   Socioeconomic History   Marital status: Married    Spouse name: Not on file   Number of children: 2   Years of education: Not on file   Highest education level: Not on file  Occupational History   Not on file  Tobacco Use   Smoking status: Some Days    Current packs/day: 1.00    Average packs/day: 1 pack/day for 32.0 years (32.0 ttl pk-yrs)  Types: Cigarettes   Smokeless tobacco: Never   Tobacco comments:    3 cigarettes/ day as of 06-30-16  Vaping Use   Vaping status: Never Used  Substance and Sexual Activity   Alcohol use: No    Alcohol/week: 0.0 standard drinks of alcohol   Drug use: No   Sexual activity: Not Currently    Birth control/protection: Post-menopausal, Surgical    Comment: 1st intercourse 15 yo-1 partner  Other Topics Concern   Not on file  Social History Narrative   Married 1970 (Jonny Ruiz is a patient here). 1 living son (1 son died of esophageal cancer- had daughter Konrad Felix and they were closed). 3 grandchildren. 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  All other systems are negative.  Review of Systems  Vital Signs: There were no vitals taken for this visit.  Advance Care Plan: The advanced care place/surrogate decision maker was discussed at the time of visit and the patient did not wish to discuss or was not able to name a surrogate decision maker or provide an advance care plan.  Physical Exam  Imaging: NM PET Image Initial (PI) Skull Base To Thigh (F-18 FDG) Result Date: 08/23/2023 CLINICAL DATA:  Initial treatment strategy for recurrent endometrial cancer. EXAM: NUCLEAR MEDICINE PET SKULL BASE TO THIGH TECHNIQUE: 6.4 mCi F-18 FDG was injected intravenously. Full-ring PET imaging was performed from the skull base to thigh after the radiotracer. CT data was obtained and used for attenuation correction and anatomic localization. Fasting blood glucose: 103 mg/dl COMPARISON:  CT exams from 08/04/2023 and 07/17/2023 FINDINGS: Mediastinal blood pool activity: SUV max 2.0 Liver activity: SUV max NA NECK: Two hypermetabolic left level V lymph nodes are present on image 37 36 of series 4. The larger is more posterior in measures 0.9 cm in short axis on image 37 series 4 with maximum SUV 6.4. Incidental CT findings: Bilateral common carotid atheromatous vascular calcification. CHEST: Hypermetabolic prevascular, paraesophageal, and left axillary adenopathy. Index left axillary node 1.0 cm in short axis on image 56 series 4 with maximum SUV 8.3. Clustered and potentially faintly calcified prevascular lymph nodes measuring up to 0.8 cm in short axis with maximum SUV 4.5. Incidental CT findings: Atheromatous vascular calcification  of the thoracic aorta and branch vessels. Airway thickening is present, suggesting bronchitis or reactive airways disease. ABDOMEN/PELVIS: Hypermetabolic right gastric, peripancreatic, periaortic, index mesenteric lymph node 0.7 cm in short axis on image 123 series 4 with maximum SUV 8.2. No hypermetabolic lymph nodes in the pelvis. Porta hepatis, mesenteric adenopathy. Index right gastric node 1.3 cm in short axis on image 100 series 4, maximum SUV 9.6. Incidental CT findings: Infrarenal abdominal aortic aneurysm 3.3 cm in maximum diameter on image 118 series 4, stable. Recommend surveillance ultrasound in 3 years. Reference: Journal of Vascular Surgery 67.1 (2018): 2-77. J Am Coll Radiol [phone]. SKELETON: No significant abnormal hypermetabolic activity in this region. Incidental CT  findings: None. IMPRESSION: 1. Hypermetabolic adenopathy in the left lower neck, chest, and abdomen, compatible with active malignancy. 2. Infrarenal abdominal aortic aneurysm measuring 3.3 cm in maximum diameter. Recommend surveillance ultrasound in 3 years. 3. Airway thickening is present, suggesting bronchitis or reactive airways disease. 4. Aortic atherosclerosis. Aortic Atherosclerosis (ICD10-I70.0). Electronically Signed   By: Gaylyn Rong M.D.   On: 08/23/2023 17:33    Labs:  CBC: Recent Labs    12/12/22 1514 03/03/23 1327  WBC 6.4 7.2  HGB 13.5 13.2  HCT 40.1 39.8  PLT 248.0 226.0    COAGS: No results for input(s): "INR", "APTT" in the last 8760 hours.  BMP: Recent Labs    12/12/22 1514 03/03/23 1327  NA 142 139  K 4.3 3.5  CL 104 104  CO2 30 28  GLUCOSE 94 108*  BUN 13 10  CALCIUM 10.0 9.9  CREATININE 1.26* 0.97    LIVER FUNCTION TESTS: Recent Labs    12/12/22 1514 03/03/23 1327  BILITOT 0.4 0.3  AST 19 19  ALT 15 14  ALKPHOS 87 87  PROT 6.8 7.2  ALBUMIN 4.2 4.1    TUMOR MARKERS: No results for input(s): "AFPTM", "CEA", "CA199", "CHROMGRNA" in the last 8760  hours.  Assessment and Plan:  ***  Thank you for allowing our service to participate in LEGACIE DILLINGHAM 's care.  Electronically Signed: D. Jeananne Rama, PA-C   09/14/2023, 3:19 PM      I spent a total of {New INPT:304952001} {New Out-Pt:304952002}  {Established Out-Pt:304952003} in face to face in clinical consultation, greater than 50% of which was counseling/coordinating care for ***

## 2023-09-15 ENCOUNTER — Other Ambulatory Visit: Payer: Self-pay

## 2023-09-15 ENCOUNTER — Ambulatory Visit (HOSPITAL_COMMUNITY)
Admission: RE | Admit: 2023-09-15 | Discharge: 2023-09-15 | Disposition: A | Discharge status: Home / Self Care | Payer: PPO | Source: Ambulatory Visit | Attending: Hematology and Oncology | Admitting: Hematology and Oncology

## 2023-09-15 ENCOUNTER — Encounter (HOSPITAL_COMMUNITY): Payer: Self-pay

## 2023-09-15 DIAGNOSIS — F419 Anxiety disorder, unspecified: Secondary | ICD-10-CM | POA: Insufficient documentation

## 2023-09-15 DIAGNOSIS — F17210 Nicotine dependence, cigarettes, uncomplicated: Secondary | ICD-10-CM | POA: Diagnosis not present

## 2023-09-15 DIAGNOSIS — Z90722 Acquired absence of ovaries, bilateral: Secondary | ICD-10-CM | POA: Insufficient documentation

## 2023-09-15 DIAGNOSIS — Z923 Personal history of irradiation: Secondary | ICD-10-CM | POA: Diagnosis not present

## 2023-09-15 DIAGNOSIS — K219 Gastro-esophageal reflux disease without esophagitis: Secondary | ICD-10-CM | POA: Insufficient documentation

## 2023-09-15 DIAGNOSIS — C770 Secondary and unspecified malignant neoplasm of lymph nodes of head, face and neck: Secondary | ICD-10-CM | POA: Insufficient documentation

## 2023-09-15 DIAGNOSIS — Z9221 Personal history of antineoplastic chemotherapy: Secondary | ICD-10-CM | POA: Diagnosis not present

## 2023-09-15 DIAGNOSIS — E785 Hyperlipidemia, unspecified: Secondary | ICD-10-CM | POA: Insufficient documentation

## 2023-09-15 DIAGNOSIS — C541 Malignant neoplasm of endometrium: Secondary | ICD-10-CM

## 2023-09-15 DIAGNOSIS — Z8542 Personal history of malignant neoplasm of other parts of uterus: Secondary | ICD-10-CM | POA: Diagnosis not present

## 2023-09-15 DIAGNOSIS — M8580 Other specified disorders of bone density and structure, unspecified site: Secondary | ICD-10-CM | POA: Insufficient documentation

## 2023-09-15 DIAGNOSIS — R9389 Abnormal findings on diagnostic imaging of other specified body structures: Secondary | ICD-10-CM | POA: Diagnosis not present

## 2023-09-15 DIAGNOSIS — R590 Localized enlarged lymph nodes: Secondary | ICD-10-CM | POA: Diagnosis present

## 2023-09-15 DIAGNOSIS — Z90710 Acquired absence of both cervix and uterus: Secondary | ICD-10-CM | POA: Diagnosis not present

## 2023-09-15 LAB — BASIC METABOLIC PANEL
Anion gap: 9 (ref 5–15)
BUN: 10 mg/dL (ref 8–23)
CO2: 26 mmol/L (ref 22–32)
Calcium: 9.2 mg/dL (ref 8.9–10.3)
Chloride: 102 mmol/L (ref 98–111)
Creatinine, Ser: 0.89 mg/dL (ref 0.44–1.00)
GFR, Estimated: >60 mL/min (ref >60)
Glucose, Bld: 89 mg/dL (ref 70–99)
Potassium: 3.7 mmol/L (ref 3.5–5.1)
Sodium: 137 mmol/L (ref 135–145)

## 2023-09-15 LAB — CBC WITH DIFFERENTIAL/PLATELET
Abs Immature Granulocytes: 0.02 10*3/uL (ref 0.00–0.07)
Basophils Absolute: 0 10*3/uL (ref 0.0–0.1)
Basophils Relative: 1 %
Eosinophils Absolute: 0.1 10*3/uL (ref 0.0–0.5)
Eosinophils Relative: 2 %
HCT: 36.1 % (ref 36.0–46.0)
Hemoglobin: 11.7 g/dL — ABNORMAL LOW (ref 12.0–15.0)
Immature Granulocytes: 0 %
Lymphocytes Relative: 24 %
Lymphs Abs: 1.4 10*3/uL (ref 0.7–4.0)
MCH: 35.1 pg — ABNORMAL HIGH (ref 26.0–34.0)
MCHC: 32.4 g/dL (ref 30.0–36.0)
MCV: 108.4 fL — ABNORMAL HIGH (ref 80.0–100.0)
Monocytes Absolute: 0.5 10*3/uL (ref 0.1–1.0)
Monocytes Relative: 9 %
Neutro Abs: 3.7 10*3/uL (ref 1.7–7.7)
Neutrophils Relative %: 64 %
Platelets: 267 10*3/uL (ref 150–400)
RBC: 3.33 MIL/uL — ABNORMAL LOW (ref 3.87–5.11)
RDW: 13.5 % (ref 11.5–15.5)
Smear Review: NORMAL
WBC: 5.7 10*3/uL (ref 4.0–10.5)
nRBC: 0 % (ref 0.0–0.2)

## 2023-09-15 LAB — PROTIME-INR
INR: 1.1 (ref 0.8–1.2)
Prothrombin Time: 14 s (ref 11.4–15.2)

## 2023-09-15 MED ORDER — SODIUM CHLORIDE 0.9 % IV SOLN: INTRAVENOUS | Status: DC

## 2023-09-15 MED ORDER — LIDOCAINE HCL 1 % IJ SOLN
INTRAMUSCULAR | Status: AC
  Filled 2023-09-15: qty 20

## 2023-09-15 NOTE — Discharge Instructions (Addendum)
Please call Interventional Radiology clinic [phone] with any questions or concerns.   You may remove your dressing and shower tomorrow.    Needle Biopsy, Care After These instructions tell you how to care for yourself after your procedure. Your doctor may also give you more specific instructions. Call your doctor if [name] any problems or questions. What can I expect after the procedure? After the procedure, it is common to have: Soreness. Bruising. Mild pain. Follow these instructions at home:  Return to your normal activities as told by your doctor. Ask your doctor what activities are safe for you. Take over-the-counter and prescription medicines only as told by your doctor. Wash your hands with soap and water before you change your bandage (dressing). If you cannot use soap and water, use hand sanitizer. Follow instructions from your doctor about: How to take care of your puncture site. When and how to change your bandage. When to remove your bandage. Check your puncture site every day for signs of infection. Watch for: Redness, swelling, or pain. Fluid or blood. Pus or a bad smell. Warmth. Do not take baths, swim, or use a hot tub until your doctor approves. Ask your doctor if you may take showers. You may only be allowed to take sponge baths. Keep all follow-up visits as told by your doctor. This is important. Contact a doctor if you have: A fever. Redness, swelling, or pain at the puncture site, and it lasts longer than a few days. Fluid, blood, or pus coming from the puncture site. Warmth coming from the puncture site. Get help right away if: You have a lot of bleeding from the puncture site. Summary After the procedure, it is common to have soreness, bruising, or mild pain at the puncture site. Check your puncture site every day for signs of infection, such as redness, swelling, or pain. Get help right away if you have severe bleeding from your puncture  site. This information is not intended to replace advice given to you by your health care provider. Make sure you discuss any questions you have with your healthcare provider. Document Revised: 01/16/2020 Document Reviewed: 01/16/2020 Elsevier Patient Education  2022 [employer].

## 2023-09-15 NOTE — Procedures (Signed)
Interventional Radiology Procedure:   Indications: History of endometrial cancer with lymphadenopathy  Procedure: US guided core biopsy of left cervical lymph node  Findings: 18 gauge core biopsies of enlarged left cervical lymph node.  Specimens placed in saline.  Complications: None     EBL: Minimal  Plan: Discharge to home.  Monserrath Junio R. Lowella Dandy, MD  Pager: [phone]

## 2023-09-19 LAB — SURGICAL PATHOLOGY

## 2023-09-20 ENCOUNTER — Other Ambulatory Visit: Payer: Self-pay | Admitting: Ophthalmology

## 2023-09-20 DIAGNOSIS — R3129 Other microscopic hematuria: Secondary | ICD-10-CM | POA: Diagnosis not present

## 2023-09-20 DIAGNOSIS — D414 Neoplasm of uncertain behavior of bladder: Secondary | ICD-10-CM | POA: Diagnosis not present

## 2023-09-20 DIAGNOSIS — C7982 Secondary malignant neoplasm of genital organs: Secondary | ICD-10-CM | POA: Diagnosis not present

## 2023-09-20 DIAGNOSIS — N3080 Other cystitis without hematuria: Secondary | ICD-10-CM | POA: Diagnosis not present

## 2023-09-20 DIAGNOSIS — C800 Disseminated malignant neoplasm, unspecified: Secondary | ICD-10-CM | POA: Diagnosis not present

## 2023-09-22 ENCOUNTER — Encounter: Payer: Self-pay | Admitting: Hematology and Oncology

## 2023-09-22 ENCOUNTER — Encounter: Payer: Self-pay | Admitting: Oncology

## 2023-09-22 ENCOUNTER — Telehealth: Payer: Self-pay

## 2023-09-22 ENCOUNTER — Other Ambulatory Visit: Payer: Self-pay | Admitting: Hematology and Oncology

## 2023-09-22 ENCOUNTER — Inpatient Hospital Stay: Payer: PPO | Attending: Hematology and Oncology | Admitting: Hematology and Oncology

## 2023-09-22 VITALS — BP 125/77 | HR 108 | Temp 97.7°F | Resp 18 | Ht 62.0 in | Wt 120.8 lb

## 2023-09-22 DIAGNOSIS — R319 Hematuria, unspecified: Secondary | ICD-10-CM | POA: Diagnosis not present

## 2023-09-22 DIAGNOSIS — C541 Malignant neoplasm of endometrium: Secondary | ICD-10-CM | POA: Diagnosis not present

## 2023-09-22 DIAGNOSIS — T451X5A Adverse effect of antineoplastic and immunosuppressive drugs, initial encounter: Secondary | ICD-10-CM

## 2023-09-22 DIAGNOSIS — F419 Anxiety disorder, unspecified: Secondary | ICD-10-CM | POA: Insufficient documentation

## 2023-09-22 DIAGNOSIS — R4589 Other symptoms and signs involving emotional state: Secondary | ICD-10-CM

## 2023-09-22 DIAGNOSIS — C778 Secondary and unspecified malignant neoplasm of lymph nodes of multiple regions: Secondary | ICD-10-CM | POA: Insufficient documentation

## 2023-09-22 DIAGNOSIS — Z7189 Other specified counseling: Secondary | ICD-10-CM | POA: Diagnosis not present

## 2023-09-22 DIAGNOSIS — Z720 Tobacco use: Secondary | ICD-10-CM

## 2023-09-22 DIAGNOSIS — G620 Drug-induced polyneuropathy: Secondary | ICD-10-CM | POA: Insufficient documentation

## 2023-09-22 DIAGNOSIS — D7589 Other specified diseases of blood and blood-forming organs: Secondary | ICD-10-CM

## 2023-09-22 MED ORDER — LIDOCAINE-PRILOCAINE 2.5-2.5 % EX CREA: TOPICAL_CREAM | CUTANEOUS | 3 refills | Status: DC

## 2023-09-22 MED ORDER — DEXAMETHASONE 4 MG PO TABS: ORAL_TABLET | ORAL | 6 refills | Status: DC

## 2023-09-22 MED ORDER — PROCHLORPERAZINE MALEATE 10 MG PO TABS: 10.0000 mg | ORAL_TABLET | Freq: Four times a day (QID) | ORAL | 1 refills | Status: DC | PRN

## 2023-09-22 MED ORDER — MIRTAZAPINE 7.5 MG PO TABS: 7.5000 mg | ORAL_TABLET | Freq: Every day | ORAL | 1 refills | Status: DC

## 2023-09-22 MED ORDER — ONDANSETRON HCL 8 MG PO TABS: 8.0000 mg | ORAL_TABLET | Freq: Three times a day (TID) | ORAL | 1 refills | Status: DC | PRN

## 2023-09-22 NOTE — Progress Notes (Signed)
[facility] OFFICE PROGRESS NOTE  Patient Care Team: Shelva Majestic, MD as PCP - General (Family Medicine) Crista Elliot, MD as Referring Physician (Urology)  ASSESSMENT & PLAN:  Endometrial cancer Surgery [facility]) I gave the patient copies of her pathology report Unfortunately, the patient developed recurrent endometrial cancer in a metastatic fashion The goal of treatment is considered treatable but not curable I will get her pathology sample sent to outside lab for molecular testing We discussed port placement We discussed current available treatment options including NCCN guidelines  We reviewed the NCCN guidelines I recommend treatment based on publication as follows:  Dostarlimab for Primary Advanced or Recurrent Endometrial Cancer  [place] Maryclare Bean, M.D., Cherly Beach. Almeta Monas, M.D., Orion Crook. Slomov[place], M.D., Ren Doreene Adas, Ph.D., Sherri Rad, Ph.D., Doristine Locks, M.D., Lattie Haw, M.D., Aleda Grana, M.D., Sharlene Motts, M.D., Rosezella [place]. Buelah Manis, M.D., Ph.D., Clarise Cruz. Tora Kindred, M.D., Genia Plants, M.D., et al., for the [employer]*  Malva Limes Med [phone] DOI: [id number]  BACKGROUND Dostarlimab is an immune-checkpoint inhibitor that targets the programmed cell death 1 receptor. The combination of chemotherapy and immunotherapy may have synergistic effects in the treatment of endometrial cancer.  METHODS We conducted a phase 3, global, double-blind, randomized, placebo-controlled trial. Eligible patients with primary advanced stage III or IV or first recurrent endometrial cancer were randomly assigned in a 1:1 ratio to receive either dostarlimab (500 mg) or placebo, plus carboplatin (area under the concentration-time curve, 5 mg per milliliter per minute) and paclitaxel (175 mg per square meter of body-surface area), every 3 weeks (six cycles), followed by dostarlimab (1000 mg) or placebo every 6 weeks for up to 3 years. The primary  end points were progression-free survival as assessed by the investigator according to Response Evaluation Criteria in Solid Tumors (RECIST), version 1.1, and overall survival. Safety was also assessed.  RESULTS Of the 494 patients who underwent randomization, 118 (23.9%) had mismatch repair-deficient (dMMR), microsatellite instability-high (MSI-H) tumors. In the dMMR-MSI-H population, estimated progression-free survival at 24 months was 61.4% (95% confidence interval [CI], 46.3 to 73.4) in the dostarlimab group and 15.7% (95% CI, 7.2 to 27.0) in the placebo group (hazard ratio for progression or death, 0.28; 95% CI, 0.16 to 0.50; P<0.001). In the overall population, progression-free survival at 24 months was 36.1% (95% CI, 29.3 to 42.9) in the dostarlimab group and 18.1% (95% CI, 13.0 to 23.9) in the placebo group (hazard ratio, 0.64; 95% CI, 0.51 to 0.80; P<0.001). Overall survival at 24 months was 71.3% (95% CI, 64.5 to 77.1) with dostarlimab and 56.0% (95% CI, 48.9 to 62.5) with placebo (hazard ratio for death, 0.64; 95% CI, 0.46 to 0.87). The most common adverse events that occurred or worsened during treatment were nausea (53.9% of the patients in the dostarlimab group and 45.9% of those in the placebo group), alopecia (53.5% and 50.0%), and fatigue (51.9% and 54.5%). Severe and serious adverse events were more frequent in the dostarlimab group than in the placebo group.  CONCLUSIONS Dostarlimab plus carboplatin-paclitaxel significantly increased progression-free survival among patients with primary advanced or recurrent endometrial cancer, with a substantial benefit in the dMMR-MSI-H population. (Funded by [employer]; [employer].gov number, [id number])  The risks, benefits, side effects of treatment is discussed with the patient and she agreed to proceed with plan of care.  I recommend repeat imaging study after 3 cycles of treatment I recommend port placement I will prescribe upfront dose  reduction due to pre-existing peripheral neuropathy  Hematuria  She had recent cystoscopy and biopsy Results are not available We discussed importance of smoking cessation  Anxiety about health Symptoms are better controlled with Remeron I refilled her prescription today  Tobacco abuse I spent some time counseling the patient the importance of tobacco cessation. We discussed common strategies including nicotine patches, Tobacco Quit-line, and other nicotine replacement products to assist in hereffort to quit  she appears motivated to quit.   Peripheral neuropathy due to chemotherapy [facility]) I will prescribe upfront dose reduction We also discussed the role of cryotherapy  Goals of care, counseling/discussion Counseled and coordinated advanced care planning today.  Patient is aware she has stage IV disease and disease is incurable. Treatment offered is palliative in nature. We discussed a realistic and effective plan that will be reviewed and updated on an ongoing basis as indicated. We discussed the following: Risks, benefits and alternatives to the various treatment options Discussed patient's personal belief/values/goals Discussed palliative care options, ways to avoid hospitalization and patient's desire for care if decision making capacity is affected Discussed MOST form, Advanced Directive and Code Status The patient does not have advanced directive at home.  She is leaning towards DO NOT RESUSCITATE but would like some more time to think about it Will discuss that in the next visit  Orders Placed This Encounter  Procedures   IR IMAGING GUIDED PORT INSERTION    Standing Status:   Future    Expected Date:   09/29/2023    Expiration Date:   09/21/2024    Reason for Exam (SYMPTOM  OR DIAGNOSIS REQUIRED):   need port for chemo    Preferred Imaging Location?:   [facility]   CBC with Differential (Cancer Center Only)    Standing Status:   Future    Expected Date:    10/06/2023    Expiration Date:   10/05/2024   CMP (Cancer Center only)    Standing Status:   Future    Expected Date:   10/06/2023    Expiration Date:   10/05/2024   T4    Standing Status:   Future    Expected Date:   10/06/2023    Expiration Date:   10/05/2024   TSH    Standing Status:   Future    Expected Date:   10/06/2023    Expiration Date:   10/05/2024   CBC with Differential (Cancer Center Only)    Standing Status:   Future    Expected Date:   10/27/2023    Expiration Date:   10/26/2024   CMP (Cancer Center only)    Standing Status:   Future    Expected Date:   10/27/2023    Expiration Date:   10/26/2024   CBC with Differential (Cancer Center Only)    Standing Status:   Future    Expected Date:   11/17/2023    Expiration Date:   11/16/2024   CMP (Cancer Center only)    Standing Status:   Future    Expected Date:   11/17/2023    Expiration Date:   11/16/2024   T4    Standing Status:   Future    Expected Date:   11/17/2023    Expiration Date:   11/16/2024   TSH    Standing Status:   Future    Expected Date:   11/17/2023    Expiration Date:   11/16/2024   CBC with Differential (Cancer Center Only)    Standing Status:   Future  Expected Date:   12/08/2023    Expiration Date:   12/07/2024   CMP (Cancer Center only)    Standing Status:   Future    Expected Date:   12/08/2023    Expiration Date:   12/07/2024   CBC with Differential (Cancer Center Only)    Standing Status:   Future    Expected Date:   12/29/2023    Expiration Date:   12/28/2024   CMP (Cancer Center only)    Standing Status:   Future    Expected Date:   12/29/2023    Expiration Date:   12/28/2024   CBC with Differential (Cancer Center Only)    Standing Status:   Future    Expected Date:   01/19/2024    Expiration Date:   01/18/2025   CMP (Cancer Center only)    Standing Status:   Future    Expected Date:   01/19/2024    Expiration Date:   01/18/2025   T4    Standing Status:   Future    Expected Date:   01/19/2024    Expiration  Date:   01/18/2025   TSH    Standing Status:   Future    Expected Date:   01/19/2024    Expiration Date:   01/18/2025   CBC with Differential (Cancer Center Only)    Standing Status:   Future    Expected Date:   02/09/2024    Expiration Date:   02/08/2025   CMP (Cancer Center only)    Standing Status:   Future    Expected Date:   02/09/2024    Expiration Date:   02/08/2025   T4    Standing Status:   Future    Expected Date:   02/09/2024    Expiration Date:   02/08/2025   TSH    Standing Status:   Future    Expected Date:   02/09/2024    Expiration Date:   02/08/2025   CBC with Differential (Cancer Center Only)    Standing Status:   Future    Expected Date:   03/22/2024    Expiration Date:   03/22/2025   CMP (Cancer Center only)    Standing Status:   Future    Expected Date:   03/22/2024    Expiration Date:   03/22/2025   T4    Standing Status:   Future    Expected Date:   03/22/2024    Expiration Date:   03/22/2025   TSH    Standing Status:   Future    Expected Date:   03/22/2024    Expiration Date:   03/22/2025   CBC with Differential (Cancer Center Only)    Standing Status:   Future    Expected Date:   05/03/2024    Expiration Date:   05/03/2025   CMP (Cancer Center only)    Standing Status:   Future    Expected Date:   05/03/2024    Expiration Date:   05/03/2025   T4    Standing Status:   Future    Expected Date:   05/03/2024    Expiration Date:   05/03/2025   TSH    Standing Status:   Future    Expected Date:   05/03/2024    Expiration Date:   05/03/2025   CBC with Differential (Cancer Center Only)    Standing Status:   Future    Expected Date:   06/14/2024    Expiration Date:  06/14/2025   CMP (Cancer Center only)    Standing Status:   Future    Expected Date:   06/14/2024    Expiration Date:   06/14/2025   T4    Standing Status:   Future    Expected Date:   06/14/2024    Expiration Date:   06/14/2025   TSH    Standing Status:   Future    Expected Date:   06/14/2024     Expiration Date:   06/14/2025   CBC with Differential (Cancer Center Only)    Standing Status:   Future    Expected Date:   07/26/2024    Expiration Date:   07/26/2025   CMP (Cancer Center only)    Standing Status:   Future    Expected Date:   07/26/2024    Expiration Date:   07/26/2025   T4    Standing Status:   Future    Expected Date:   07/26/2024    Expiration Date:   07/26/2025   TSH    Standing Status:   Future    Expected Date:   07/26/2024    Expiration Date:   07/26/2025   CBC with Differential (Cancer Center Only)    Standing Status:   Future    Expected Date:   09/06/2024    Expiration Date:   09/06/2025   CMP (Cancer Center only)    Standing Status:   Future    Expected Date:   09/06/2024    Expiration Date:   09/06/2025   T4    Standing Status:   Future    Expected Date:   09/06/2024    Expiration Date:   09/06/2025   TSH    Standing Status:   Future    Expected Date:   09/06/2024    Expiration Date:   09/06/2025    All questions were answered. The patient knows to call the clinic with any problems, questions or concerns. The total time spent in the appointment was 80 minutes encounter with patients including review of chart and various tests results, discussions about plan of care and coordination of care plan   Artis Delay, MD 09/22/2023 3:17 PM  INTERVAL HISTORY: Please see below for problem oriented charting. she returns for review test results She is here accompanied by her husband and daughter We review pathology report We discussed staging of disease and the role of concurrent chemotherapy with immunotherapy We discussed the role of molecular testing We discussed importance of tobacco cessation We discussed the role of port placement and the timing of the start date of her treatment  REVIEW OF SYSTEMS:   Constitutional: Denies fevers, chills or abnormal weight loss Eyes: Denies blurriness of vision Ears, nose, mouth, throat, and face: Denies mucositis or sore  throat Respiratory: Denies cough, dyspnea or wheezes Cardiovascular: Denies palpitation, chest discomfort or lower extremity swelling Gastrointestinal:  Denies nausea, heartburn or change in bowel habits Skin: Denies abnormal skin rashes Lymphatics: Denies new lymphadenopathy or easy bruising Behavioral/Psych: Mood is stable, no new changes  All other systems were reviewed with the patient and are negative.  I have reviewed the past medical history, past surgical history, social history and family history with the patient and they are unchanged from previous note.  ALLERGIES:  is allergic to codeine sulfate and neomycin-bacitracin zn-polymyx.  MEDICATIONS:  Current Outpatient Medications  Medication Sig Dispense Refill   dexamethasone (DECADRON) 4 MG tablet Take 2 tabs at the night before and 2  tab the morning of chemotherapy, every 3 weeks, by mouth x 6 cycles 24 tablet 6   atorvastatin (LIPITOR) 20 MG tablet TAKE 1 TABLET(20 MG) BY MOUTH DAILY 90 tablet 3   b complex vitamins capsule Take 1 capsule by mouth daily.     lidocaine-prilocaine (EMLA) cream Apply to affected area once 30 g 3   mirtazapine (REMERON) 7.5 MG tablet Take 1 tablet (7.5 mg total) by mouth at bedtime. 90 tablet 1   Multiple Vitamin (MULTIVITAMIN) tablet Take 1 tablet by mouth daily.     ondansetron (ZOFRAN) 8 MG tablet Take 1 tablet (8 mg total) by mouth every 8 (eight) hours as needed for nausea or vomiting. Start on the third day after chemotherapy. 30 tablet 1   prochlorperazine (COMPAZINE) 10 MG tablet Take 1 tablet (10 mg total) by mouth every 6 (six) hours as needed for nausea or vomiting. 30 tablet 1   traZODone (DESYREL) 50 MG tablet Take 0.5-1 tablets (25-50 mg total) by mouth at bedtime as needed for sleep. 90 tablet 3   VITAMIN D PO Take by mouth.     No current facility-administered medications for this visit.    SUMMARY OF ONCOLOGIC HISTORY: Oncology History Overview Note  CARIS [phone] p53  mutated, MSI stable, pMMR, BRCA neg,  ER neg, Her2/neu 1+, PDL1 0% Recurrent high grade serous, p53 mutated   Endometrial cancer (HCC)  10/29/2015 Pathology Results   Pathology [phone] had high grade serous adenocarcinoma of uterus involving cervical stroma and outer half of myometrium,  isolated tumor cells in 2/2 right external iliac and 2/2 left obturator nodes, negative right and left periaortics, bilateral tubes and ovaries benign. Immunohistochemical stains confirmed serous carcinoma, ER and PR negative.    11/09/2015 Initial Diagnosis   Patient presented to Dr Audie Box as new patient 10-01-15 after noticing recent slight vaginal spotting. PAP had atypical glandular cells, then sonohysterogram and endometrial biopsy on 10-19-15. The uterus was 7.4 x 3.5 x 3.0 cm with endometrial stripe of 6.8 mm.  Endometrial biopsy ([id number] [facility]) showed endometrial carcinoma which appeared high grade with serous features. She was seen by Dr Yolande Jolly on 10-23-15, exam not remarkable. She had CT CAP 10-28-15 with no evidence of metastatic disease, incidental atherosclerosis and ectatic abdominal aorta. She had surgery at [facility] by Dr Ronita Hipps on 10-29-15, which was robotic hysterectomy BSO with pelvic and paraaortic sentinel node evaluation. Intraoperative findings were of small uterus with normal tubes and ovaries. Adhesions of bladder due to uterine fundus from prior surgery, bilateral pelvic sentinel pelvic mapping to left obturator space node and to right external iliac node, 3 right paraaortic nodes mapped as sentinel nodes, normal upper abdominal survey.    11/20/2015 - 04/21/2016 Chemotherapy    First carboplatin taxol given 11-20-15, leukopenic with counts not at nadir day 11 cycle 1, ANC 1.2, given granix x 1. She did not have further gCSF until documented neutropenia day 15 cycle 3 with ANC 0.2. Pelvic IMRT was given in sandwich fashion after first 3 cycles of chemo, completed 02-23-16;  she additionally had vaginal brachytherapy. Chemo resumed with cycle 4 carbo taxol on 03-03-16. HDR x 3 on 03-08-16, 03-17-16, 03-24-16. Cycle 5 delayed x 1 week with thrombocytopenia, carbo dose decreased then. Cycle 6 given 04-21-16.    04/16/2021 Imaging   CT imaging of the abdomen and pelvis showed No evidence of recurrent or metastatic carcinoma within the abdomen or pelvis.   3.2 cm infrarenal abdominal aortic aneurysm, slightly increased  in size from 3.0 cm on prior exam. Recommend follow-up every 3 years.   08/04/2023 Imaging   CT imaging done at the urologist office showed new upper abdominal, retroperitoneal and mesenteric lymphadenopathy consistent with metastatic disease.   08/18/2023 PET scan   NM PET Image Initial (PI) Skull Base To Thigh (F-18 FDG) Result Date: 08/23/2023 CLINICAL DATA:  Initial treatment strategy for recurrent endometrial cancer. EXAM: NUCLEAR MEDICINE PET SKULL BASE TO THIGH TECHNIQUE: 6.4 mCi F-18 FDG was injected intravenously. Full-ring PET imaging was performed from the skull base to thigh after the radiotracer. CT data was obtained and used for attenuation correction and anatomic localization. Fasting blood glucose: 103 mg/dl COMPARISON:  CT exams from 08/04/2023 and 07/17/2023 FINDINGS: Mediastinal blood pool activity: SUV max 2.0 Liver activity: SUV max NA NECK: Two hypermetabolic left level V lymph nodes are present on image 37 36 of series 4. The larger is more posterior in measures 0.9 cm in short axis on image 37 series 4 with maximum SUV 6.4. Incidental CT findings: Bilateral common carotid atheromatous vascular calcification. CHEST: Hypermetabolic prevascular, paraesophageal, and left axillary adenopathy. Index left axillary node 1.0 cm in short axis on image 56 series 4 with maximum SUV 8.3. Clustered and potentially faintly calcified prevascular lymph nodes measuring up to 0.8 cm in short axis with maximum SUV 4.5. Incidental CT findings: Atheromatous vascular  calcification of the thoracic aorta and branch vessels. Airway thickening is present, suggesting bronchitis or reactive airways disease. ABDOMEN/PELVIS: Hypermetabolic right gastric, peripancreatic, periaortic, index mesenteric lymph node 0.7 cm in short axis on image 123 series 4 with maximum SUV 8.2. No hypermetabolic lymph nodes in the pelvis. Porta hepatis, mesenteric adenopathy. Index right gastric node 1.3 cm in short axis on image 100 series 4, maximum SUV 9.6. Incidental CT findings: Infrarenal abdominal aortic aneurysm 3.3 cm in maximum diameter on image 118 series 4, stable. Recommend surveillance ultrasound in 3 years. Reference: Journal of Vascular Surgery 67.1 (2018): 2-77. J Am Coll Radiol [id number]. SKELETON: No significant abnormal hypermetabolic activity in this region. Incidental CT findings: None. IMPRESSION: 1. Hypermetabolic adenopathy in the left lower neck, chest, and abdomen, compatible with active malignancy. 2. Infrarenal abdominal aortic aneurysm measuring 3.3 cm in maximum diameter. Recommend surveillance ultrasound in 3 years. 3. Airway thickening is present, suggesting bronchitis or reactive airways disease. 4. Aortic atherosclerosis. Aortic Atherosclerosis (ICD10-I70.0). Electronically Signed   By: Gaylyn Rong M.D.   On: 08/23/2023 17:33      09/15/2023 Procedure   Ultrasound-guided core biopsy of an abnormal left cervical lymph node.   09/19/2023 Pathology Results   SURGICAL PATHOLOGY  CASE: [id number]  PATIENT: Nashya Dunklee  Surgical Pathology Report   Clinical History: hx of endometrial cancer with left cervical, left axillary and abdominal lymphadenopathy (tb)   FINAL MICROSCOPIC DIAGNOSIS:   A. LYMHP NODE, BIOPSY:  -  Metastatic carcinoma consistent with the patient's known endometrial cancer.   Note: The biopsies consists almost exclusively of poorly differentiated carcinoma with only limited lymphoid tissue present.  The nuclei are large with  irregular nuclear contours, multiple nucleoli as well as some with smudge/hyperchromatic forms.  There is a high apoptotic mitotic index with focal necrosis.  There is a mild to moderate amount of eosinophilic cytoplasm.  Immunohistochemical stains are positive for CK7, PAX8, p16 and p53 is strongly positive (mutant pattern) and are negative for GATA3, TTF-1, CK20 and CDX2.  The findings are consistent with a gynecologic primary and the immunophenotype most fits with a high-grade  serous carcinoma.    10/06/2023 -  Chemotherapy   Patient is on Treatment Plan : UTERINE ENDOMETRIAL Dostarlimab-gxly (500 mg) + Carboplatin (AUC 5) + Paclitaxel (175 mg/m2) q21d x 6 cycles / Dostarlimab-gxly (1000 mg) q42d x 6 cycles        PHYSICAL EXAMINATION: ECOG PERFORMANCE STATUS: 1 - Symptomatic but completely ambulatory  Vitals:   09/22/23 1254  BP: 125/77  Pulse: (!) 108  Resp: 18  Temp: 97.7 F (36.5 C)  SpO2: 100%   Filed Weights   09/22/23 1254  Weight: 120 lb 12.8 oz (54.8 kg)    GENERAL:alert, no distress and comfortable NEURO: alert & oriented x 3 with fluent speech, no focal motor/sensory deficits  LABORATORY DATA:  I have reviewed the data as listed    Component Value Date/Time   NA 137 09/15/2023 1145   NA 142 06/30/2016 1348   K 3.7 09/15/2023 1145   K 3.4 (L) 06/30/2016 1348   CL 102 09/15/2023 1145   CO2 26 09/15/2023 1145   CO2 28 06/30/2016 1348   GLUCOSE 89 09/15/2023 1145   GLUCOSE 112 06/30/2016 1348   BUN 10 09/15/2023 1145   BUN 8.7 06/30/2016 1348   CREATININE 0.89 09/15/2023 1145   CREATININE 1.32 (H) 05/07/2020 1519   CREATININE 0.8 06/30/2016 1348   CALCIUM 9.2 09/15/2023 1145   CALCIUM 10.0 06/30/2016 1348   PROT 7.2 03/03/2023 1327   PROT 6.7 06/30/2016 1348   ALBUMIN 4.1 03/03/2023 1327   ALBUMIN 3.6 06/30/2016 1348   AST 19 03/03/2023 1327   AST 17 06/30/2016 1348   ALT 14 03/03/2023 1327   ALT 12 06/30/2016 1348   ALKPHOS 87 03/03/2023 1327    ALKPHOS 88 06/30/2016 1348   BILITOT 0.3 03/03/2023 1327   BILITOT 0.31 06/30/2016 1348   GFRNONAA >60 09/15/2023 1145   GFRNONAA 41 (L) 05/07/2020 1519   GFRAA 48 (L) 05/07/2020 1519    No results found for: "SPEP", "UPEP"  Lab Results  Component Value Date   WBC 5.7 09/15/2023   NEUTROABS 3.7 09/15/2023   HGB 11.7 (L) 09/15/2023   HCT 36.1 09/15/2023   MCV 108.4 (H) 09/15/2023   PLT 267 09/15/2023      Chemistry      Component Value Date/Time   NA 137 09/15/2023 1145   NA 142 06/30/2016 1348   K 3.7 09/15/2023 1145   K 3.4 (L) 06/30/2016 1348   CL 102 09/15/2023 1145   CO2 26 09/15/2023 1145   CO2 28 06/30/2016 1348   BUN 10 09/15/2023 1145   BUN 8.7 06/30/2016 1348   CREATININE 0.89 09/15/2023 1145   CREATININE 1.32 (H) 05/07/2020 1519   CREATININE 0.8 06/30/2016 1348      Component Value Date/Time   CALCIUM 9.2 09/15/2023 1145   CALCIUM 10.0 06/30/2016 1348   ALKPHOS 87 03/03/2023 1327   ALKPHOS 88 06/30/2016 1348   AST 19 03/03/2023 1327   AST 17 06/30/2016 1348   ALT 14 03/03/2023 1327   ALT 12 06/30/2016 1348   BILITOT 0.3 03/03/2023 1327   BILITOT 0.31 06/30/2016 1348       RADIOGRAPHIC STUDIES: I have personally reviewed the radiological images as listed and agreed with the findings in the report. [place] CORE BIOPSY (LYMPH NODES) Result Date: 09/15/2023 INDICATION: 73 year old with history of endometrial cancer. Patient has PET positive lymph nodes in the abdomen, left neck and left axilla. Tissue diagnosis is needed. EXAM: ULTRASOUND-GUIDED CORE BIOPSY OF LEFT CERVICAL LYMPH  NODE MEDICATIONS: 1% lidocaine for local anesthetic ANESTHESIA/SEDATION: None FLUOROSCOPY TIME:  None COMPLICATIONS: None immediate. PROCEDURE: Informed written consent was obtained from the patient after a thorough discussion of the procedural risks, benefits and alternatives. All questions were addressed.A timeout was performed prior to the initiation of the procedure. Two abnormal  lymph nodes were identified along the left side of the neck. The largest lymph node was targeted for biopsy. The left side of the neck was prepped with chlorhexidine and sterile field was created. Skin was anesthetized using 1% lidocaine. Small incision was made. Using ultrasound guidance, 18 gauge core biopsies were obtained of the lymph node. Specimens placed in saline. Bandage placed over the puncture site. FINDINGS: There are 2 adjacent hypoechoic lymph nodes along the lateral left neck. These correspond with the hypermetabolic lymph nodes on previous PET-CT. Largest lymph node was targeted. Total of 5 core biopsies were obtained from the lymph node. Adequate specimens were obtained and placed in saline. IMPRESSION: Ultrasound-guided core biopsy of an abnormal left cervical lymph node. Electronically Signed   By: Richarda Overlie M.D.   On: 09/15/2023 13:55

## 2023-09-22 NOTE — Progress Notes (Signed)
Order requisition sent to [facility] on accession [id number] per Dr. Bertis Ruddy.

## 2023-09-22 NOTE — Telephone Encounter (Signed)
Notified Patient of prior authorization approval for Lidocaine-Prilocaine 2.5% Cream (EMLA).  Medication is approved through 12/21/2023. No other needs or concerns voiced at this time.

## 2023-09-22 NOTE — Assessment & Plan Note (Signed)
I will prescribe upfront dose reduction We also discussed the role of cryotherapy

## 2023-09-22 NOTE — Assessment & Plan Note (Signed)
I spent some time counseling the patient the importance of tobacco cessation. We discussed common strategies including nicotine patches, Tobacco Quit-line, and other nicotine replacement products to assist in hereffort to quit  she appears motivated to quit.  

## 2023-09-22 NOTE — Assessment & Plan Note (Signed)
Symptoms are better controlled with Remeron I refilled her prescription today

## 2023-09-22 NOTE — Progress Notes (Signed)
 START OFF PATHWAY REGIMEN - Uterine   [id number]:Carboplatin AUC=5 IV D1 + Dostarlimab 500 mg IV D1 + Paclitaxel 175 mg/m2 IV D1 x 6 Cycles Followed by Dostarlimab 1,000 mg IV D1 q42 Days:   Cycles 1 through 6: A cycle is every 21 days:     Dostarlimab-gxly      Paclitaxel      Carboplatin    Cycles 7 and beyond: A cycle is every 42 days:     Dostarlimab-gxly   **Always confirm dose/schedule in your pharmacy ordering system**  Patient Characteristics: Serous Carcinoma, Recurrent/Progressive Disease, Second Line, Relapse ? 12 Months From Prior Therapy, HER2 Negative/Unknown Histology: Serous Carcinoma Therapeutic Status: Recurrent or Progressive Disease Line of Therapy: Second Line Time to Recurrence: Relapse ? 12 Months From Prior Therapy HER2 Status: Negative Intent of Therapy: Non-Curative / Palliative Intent, Discussed with Patient

## 2023-09-22 NOTE — Assessment & Plan Note (Signed)
Counseled and coordinated advanced care planning today.  Patient is aware she has stage IV disease and disease is incurable. Treatment offered is palliative in nature. We discussed a realistic and effective plan that will be reviewed and updated on an ongoing basis as indicated. We discussed the following: Risks, benefits and alternatives to the various treatment options Discussed patient's personal belief/values/goals Discussed palliative care options, ways to avoid hospitalization and patient's desire for care if decision making capacity is affected Discussed MOST form, Advanced Directive and Code Status The patient does not have advanced directive at home.  She is leaning towards DO NOT RESUSCITATE but would like some more time to think about it Will discuss that in the next visit

## 2023-09-22 NOTE — Assessment & Plan Note (Signed)
She had recent cystoscopy and biopsy Results are not available We discussed importance of smoking cessation

## 2023-09-22 NOTE — Assessment & Plan Note (Addendum)
I gave the patient copies of her pathology report Unfortunately, the patient developed recurrent endometrial cancer in a metastatic fashion The goal of treatment is considered treatable but not curable I will get her pathology sample sent to outside lab for molecular testing We discussed port placement We discussed current available treatment options including NCCN guidelines  We reviewed the NCCN guidelines I recommend treatment based on publication as follows:  Dostarlimab for Primary Advanced or Recurrent Endometrial Cancer  [place] Maryclare Bean, M.D., Cherly Beach. Almeta Monas, M.D., Orion Crook. Slomovitz, M.D., Ren Doreene Adas, Ph.D., Sherri Rad, Ph.D., Doristine Locks, M.D., Lattie Haw, M.D., Aleda Grana, M.D., Sharlene Motts, M.D., Rosezella [place]. Buelah Manis, M.D., Ph.D., Clarise Cruz. Tora Kindred, M.D., Genia Plants, M.D., et al., for the [employer]*  Malva Limes Med [id number] DOI: 10.1056/[id number]  BACKGROUND Dostarlimab is an immune-checkpoint inhibitor that targets the programmed cell death 1 receptor. The combination of chemotherapy and immunotherapy may have synergistic effects in the treatment of endometrial cancer.  METHODS We conducted a phase 3, global, double-blind, randomized, placebo-controlled trial. Eligible patients with primary advanced stage III or IV or first recurrent endometrial cancer were randomly assigned in a 1:1 ratio to receive either dostarlimab (500 mg) or placebo, plus carboplatin (area under the concentration-time curve, 5 mg per milliliter per minute) and paclitaxel (175 mg per square meter of body-surface area), every 3 weeks (six cycles), followed by dostarlimab (1000 mg) or placebo every 6 weeks for up to 3 years. The primary end points were progression-free survival as assessed by the investigator according to Response Evaluation Criteria in Solid Tumors (RECIST), version 1.1, and overall survival. Safety was also assessed.  RESULTS Of the 494 patients  who underwent randomization, 118 (23.9%) had mismatch repair-deficient (dMMR), microsatellite instability-high (MSI-H) tumors. In the dMMR-MSI-H population, estimated progression-free survival at 24 months was 61.4% (95% confidence interval [CI], 46.3 to 73.4) in the dostarlimab group and 15.7% (95% CI, 7.2 to 27.0) in the placebo group (hazard ratio for progression or death, 0.28; 95% CI, 0.16 to 0.50; P<0.001). In the overall population, progression-free survival at 24 months was 36.1% (95% CI, 29.3 to 42.9) in the dostarlimab group and 18.1% (95% CI, 13.0 to 23.9) in the placebo group (hazard ratio, 0.64; 95% CI, 0.51 to 0.80; P<0.001). Overall survival at 24 months was 71.3% (95% CI, 64.5 to 77.1) with dostarlimab and 56.0% (95% CI, 48.9 to 62.5) with placebo (hazard ratio for death, 0.64; 95% CI, 0.46 to 0.87). The most common adverse events that occurred or worsened during treatment were nausea (53.9% of the patients in the dostarlimab group and 45.9% of those in the placebo group), alopecia (53.5% and 50.0%), and fatigue (51.9% and 54.5%). Severe and serious adverse events were more frequent in the dostarlimab group than in the placebo group.  CONCLUSIONS Dostarlimab plus carboplatin-paclitaxel significantly increased progression-free survival among patients with primary advanced or recurrent endometrial cancer, with a substantial benefit in the dMMR-MSI-H population. (Funded by [employer]; [employer].gov number, [id number])  The risks, benefits, side effects of treatment is discussed with the patient and she agreed to proceed with plan of care.  I recommend repeat imaging study after 3 cycles of treatment I recommend port placement I will prescribe upfront dose reduction due to pre-existing peripheral neuropathy

## 2023-09-23 ENCOUNTER — Other Ambulatory Visit: Payer: Self-pay

## 2023-09-25 LAB — SURGICAL PATHOLOGY

## 2023-09-26 ENCOUNTER — Other Ambulatory Visit: Payer: Self-pay | Admitting: Radiology

## 2023-09-26 NOTE — H&P (Signed)
 Chief Complaint: recurrent endometrial carcinoma - image guided port a catheter placement   Referring Provider(s): Artis Delay   Supervising Physician: Roanna Banning  Patient Status: [facility] - Out-pt  History of Present Illness: [facility] is a 73 y.o. female with a history of anxiety, GERD, hyperlipidemia, osteopenia, and endometrial cancer status post hysterectomy with BSO at [facility] and chemoradiation.  Pt is known to [place] from 09/15/23 when Dr. Lowella Dandy performed an image guided left posterior cervical lymph node biopsy after a surveillance PET scan 08/23/23 revealed hypermetabolic adenopathy in the left lower neck, chest, and abdomen comparable with active malignancy.   The biopsy confirmed recurrent endometrial carcinoma and she was referred to interventional radiology for image guided port placement to move forward with chemotherapy treatment.    Patient is Full Code  Past Medical History:  Diagnosis Date   Adenocarcinoma of endometrium (HCC) 09/2015   Allergy    mild seasonal   Anxiety    Atypical glandular cells of undetermined significance (AGUS) on cervical Pap smear 09/2015   GERD (gastroesophageal reflux disease)    Heart murmur    as teenager but not mentioned since   History of chemotherapy    completed nov 2017   History of radiation therapy 01/14/2016-02/23/2016   pelvis 45 Gy in 25 fx   History of radiation therapy 03/08/2016, 03/17/2016, 03/24/2016   vaginal cuff 18 Gy in 3 fx   Hyperlipidemia    Osteopenia 09/2015   T score -1.4 FRAX 8.5%/0.8%    Past Surgical History:  Procedure Laterality Date   COLONOSCOPY     IR GENERIC HISTORICAL  03/02/2016   IR US GUIDE VASC ACCESS RIGHT 03/02/2016 Jacqueline Balm, MD [facility]   IR GENERIC HISTORICAL  03/02/2016   IR FLUORO GUIDE CV LINE RIGHT 03/02/2016 Jac[facility]eline Balm, MD [facility]   LAPAROSCOPY     MOHS SURGERY  2024   2023-2024 3 leg procedure, 2 arm, & face-1   POLYPECTOMY     ROBOTIC ASSISTED TOTAL HYSTERECTOMY  WITH BILATERAL SALPINGO OOPHERECTOMY  10/29/2015   robotic hysterectomy, BSO and sentinel lymph node biopsy of pelvic and PA nodes at [facility] [place] with Dr [name].    TUBAL LIGATION      Allergies: Codeine sulfate and Neomycin-bacitracin zn-polymyx  Medications: Prior to Admission medications   Medication Sig Start Date End Date Taking? Authorizing Provider  atorvastatin (LIPITOR) 20 MG tablet TAKE 1 TABLET(20 MG) BY MOUTH DAILY 09/18/23   Shelva Majestic, MD  b complex vitamins capsule Take 1 capsule by mouth daily.    [provider]  dexamethasone (DECADRON) 4 MG tablet Take 2 tabs at the night before and 2 tab the morning of chemotherapy, every 3 weeks, by mouth x 6 cycles 09/22/23   Artis Delay, MD  lidocaine-prilocaine (EMLA) cream Apply to affected area once 09/22/23   Artis Delay, MD  mirtazapine (REMERON) 7.5 MG tablet Take 1 tablet (7.5 mg total) by mouth at bedtime. 09/22/23   Artis Delay, MD  Multiple Vitamin (MULTIVITAMIN) tablet Take 1 tablet by mouth daily.    [provider]  ondansetron (ZOFRAN) 8 MG tablet Take 1 tablet (8 mg total) by mouth every 8 (eight) hours as needed for nausea or vomiting. Start on the third day after chemotherapy. 09/22/23   Artis Delay, MD  prochlorperazine (COMPAZINE) 10 MG tablet Take 1 tablet (10 mg total) by mouth every 6 (six) hours as needed for nausea or vomiting. 09/22/23   Bertis Ruddy, Ni,  MD  traZODone (DESYREL) 50 MG tablet Take 0.5-1 tablets (25-50 mg total) by mouth at bedtime as needed for sleep. 12/12/22   Shelva Majestic, MD  VITAMIN D PO Take by mouth.    [provider]     Family History  Problem Relation Age of Onset   Cancer Mother        larynx   Heart failure Father    Heart attack Father 52       smoker, war vet   Melanoma Father    Heart attack Brother 59   Colon polyps Brother    Colon cancer Paternal Grandmother 36   Colon polyps Sister    Esophageal cancer Son    Rectal cancer Neg Hx    Stomach  cancer Neg Hx     Social History   Socioeconomic History   Marital status: Married    Spouse name: Not on file   Number of children: 2   Years of education: Not on file   Highest education level: Not on file  Occupational History   Not on file  Tobacco Use   Smoking status: Every Day    Current packs/day: 1.00    Average packs/day: 1 pack/day for 32.0 years (32.0 ttl pk-yrs)    Types: Cigarettes   Smokeless tobacco: Never   Tobacco comments:    3 cigarettes/ day as of 06-30-16  Vaping Use   Vaping status: Never Used  Substance and Sexual Activity   Alcohol use: No    Alcohol/week: 0.0 standard drinks of alcohol   Drug use: No   Sexual activity: Not Currently    Birth control/protection: Post-menopausal, Surgical    Comment: 1st intercourse 9 yo-1 partner  Other Topics Concern   Not on file  Social History Narrative   Married 1970 (Jonny Ruiz is a patient here). 1 living son (1 son died of esophageal cancer- had daughter Konrad Felix and they were closed). 3 grandchildren. 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  All other systems are negative.  Review of Systems  Constitutional:  Negative for chills, fatigue and fever.  Respiratory:  Negative for cough, shortness of breath and wheezing.   Gastrointestinal:  Negative for diarrhea, nausea and vomiting.  Neurological:  Negative for dizziness and headaches.  Psychiatric/Behavioral:  Negative for agitation,  behavioral problems and confusion.     Vital Signs: There were no vitals taken for this visit.  Advance Care Plan: The advanced care place/surrogate decision maker was discussed at the time of visit and the patient did not wish to discuss or was not able to name a surrogate decision maker or provide an advance care plan.  Physical Exam Constitutional:      Appearance: She is well-developed.  HENT:     Head: Atraumatic.     Mouth/Throat:     Mouth: Mucous membranes are moist.  Cardiovascular:     Rate and  Rhythm: Normal rate and regular rhythm.     Heart sounds: No murmur heard. Pulmonary:     Effort: Pulmonary effort is normal.     Breath sounds: Normal breath sounds.  Abdominal:     General: Bowel sounds are normal.     Palpations: Abdomen is soft.  Musculoskeletal:        General: Normal range of motion.  Skin:    General: Skin is warm.  Neurological:     Mental Status: She is alert and oriented to person, place, and time.  Psychiatric:        Mood and Affect: Mood normal.        Behavior: Behavior normal.     Imaging: [place] CORE BIOPSY (LYMPH NODES) Result Date: 09/15/2023 INDICATION: 73 year old with history of endometrial cancer. Patient has PET positive lymph nodes in the abdomen, left neck and left axilla. Tissue diagnosis is needed. EXAM: ULTRASOUND-GUIDED CORE BIOPSY OF LEFT CERVICAL LYMPH NODE MEDICATIONS: 1% lidocaine for local anesthetic ANESTHESIA/SEDATION: None FLUOROSCOPY TIME:  None COMPLICATIONS: None immediate. PROCEDURE: Informed written consent was obtained from the patient after a thorough discussion of the procedural risks, benefits and alternatives. All questions were addressed.A timeout was performed prior to the initiation of the procedure. Two abnormal lymph nodes were identified along the left side of the neck. The largest lymph node was targeted for biopsy. The left side of the neck was prepped with chlorhexidine and sterile field was created. Skin was  anesthetized using 1% lidocaine. Small incision was made. Using ultrasound guidance, 18 gauge core biopsies were obtained of the lymph node. Specimens placed in saline. Bandage placed over the puncture site. FINDINGS: There are 2 adjacent hypoechoic lymph nodes along the lateral left neck. These correspond with the hypermetabolic lymph nodes on previous PET-CT. Largest lymph node was targeted. Total of 5 core biopsies were obtained from the lymph node. Adequate specimens were obtained and placed in saline. IMPRESSION: Ultrasound-guided core biopsy of an abnormal left cervical lymph node. Electronically Signed   By: Richarda Overlie M.D.   On: 09/15/2023 13:55    Labs:  CBC: Recent Labs    12/12/22 1514 03/03/23 1327 09/15/23 1145  WBC 6.4 7.2 5.7  HGB 13.5 13.2 11.7*  HCT 40.1 39.8 36.1  PLT 248.0 226.0 267    COAGS: Recent Labs    09/15/23 1145  INR 1.1    BMP: Recent Labs    12/12/22 1514 03/03/23 1327 09/15/23 1145  NA 142 139 137  K 4.3 3.5 3.7  CL 104 104 102  CO2 30 28 26   GLUCOSE 94 108* 89  BUN 13 10 10   CALCIUM 10.0 9.9 9.2  CREATININE 1.26* 0.97 0.89  GFRNONAA  --   --  >60    LIVER FUNCTION TESTS: Recent Labs    12/12/22 1514 03/03/23 1327  BILITOT 0.4 0.3  AST 19 19  ALT 15 14  ALKPHOS 87 87  PROT 6.8 7.2  ALBUMIN 4.2 4.1    TUMOR MARKERS: No results for input(s): "AFPTM", "CEA", "CA199", "CHROMGRNA" in the last 8760 hours.  Assessment and Plan:  Pt with recurrent endometrial carcinoma scheduled for image guided port a catheter placement on 09/27/23.    Risks and benefits of image guided port-a-catheter placement was discussed with the patient including, but not limited to bleeding, infection, pneumothorax, or fibrin sheath development and need for additional procedures.  All of the patient's questions were answered, patient is agreeable to proceed. Consent signed and in chart.  Thank you for allowing our service to participate in [facility]  's care.  Electronically Signed: Loman Brooklyn, PA-C   09/26/2023, 9:57 AM    I spent a total of    15 Minutes in face to face in clinical consultation, greater than 50% of which was counseling/coordinating care for image guided port a catheter placement.

## 2023-09-27 ENCOUNTER — Ambulatory Visit (HOSPITAL_COMMUNITY)
Admission: RE | Admit: 2023-09-27 | Discharge: 2023-09-27 | Disposition: A | Discharge status: Home / Self Care | Payer: PPO | Source: Ambulatory Visit | Attending: Hematology and Oncology

## 2023-09-27 ENCOUNTER — Encounter (HOSPITAL_COMMUNITY): Payer: Self-pay

## 2023-09-27 ENCOUNTER — Ambulatory Visit (HOSPITAL_COMMUNITY)
Admission: RE | Admit: 2023-09-27 | Discharge: 2023-09-27 | Discharge status: Home / Self Care | Payer: PPO | Source: Ambulatory Visit | Attending: Hematology and Oncology

## 2023-09-27 ENCOUNTER — Other Ambulatory Visit: Payer: Self-pay

## 2023-09-27 DIAGNOSIS — F17210 Nicotine dependence, cigarettes, uncomplicated: Secondary | ICD-10-CM | POA: Diagnosis not present

## 2023-09-27 DIAGNOSIS — K219 Gastro-esophageal reflux disease without esophagitis: Secondary | ICD-10-CM | POA: Diagnosis not present

## 2023-09-27 DIAGNOSIS — C541 Malignant neoplasm of endometrium: Secondary | ICD-10-CM | POA: Insufficient documentation

## 2023-09-27 DIAGNOSIS — Z90722 Acquired absence of ovaries, bilateral: Secondary | ICD-10-CM | POA: Insufficient documentation

## 2023-09-27 DIAGNOSIS — Z923 Personal history of irradiation: Secondary | ICD-10-CM | POA: Diagnosis not present

## 2023-09-27 DIAGNOSIS — M8580 Other specified disorders of bone density and structure, unspecified site: Secondary | ICD-10-CM | POA: Insufficient documentation

## 2023-09-27 DIAGNOSIS — E785 Hyperlipidemia, unspecified: Secondary | ICD-10-CM | POA: Insufficient documentation

## 2023-09-27 DIAGNOSIS — F419 Anxiety disorder, unspecified: Secondary | ICD-10-CM | POA: Insufficient documentation

## 2023-09-27 DIAGNOSIS — Z90710 Acquired absence of both cervix and uterus: Secondary | ICD-10-CM | POA: Diagnosis not present

## 2023-09-27 HISTORY — PX: IR IMAGING GUIDED PORT INSERTION: IMG5740

## 2023-09-27 MED ORDER — FENTANYL CITRATE (PF) 100 MCG/2ML IJ SOLN
INTRAMUSCULAR | Status: AC | PRN
  Administered 2023-09-27 (×2): 50 ug via INTRAVENOUS

## 2023-09-27 MED ORDER — HEPARIN SOD (PORK) LOCK FLUSH 100 UNIT/ML IV SOLN
INTRAVENOUS | Status: AC
  Filled 2023-09-27: qty 5

## 2023-09-27 MED ORDER — FENTANYL CITRATE (PF) 100 MCG/2ML IJ SOLN
INTRAMUSCULAR | Status: AC
  Filled 2023-09-27: qty 4

## 2023-09-27 MED ORDER — LIDOCAINE-EPINEPHRINE 1 %-1:100000 IJ SOLN
INTRAMUSCULAR | Status: AC
  Filled 2023-09-27: qty 1

## 2023-09-27 MED ORDER — MIDAZOLAM HCL 2 MG/2ML IJ SOLN
INTRAMUSCULAR | Status: AC | PRN
Start: 2023-09-27 — End: 2023-09-27
  Administered 2023-09-27: 1 mg via INTRAVENOUS
  Administered 2023-09-27 (×2): .5 mg via INTRAVENOUS

## 2023-09-27 MED ORDER — LIDOCAINE-EPINEPHRINE 1 %-1:100000 IJ SOLN
20.0000 mL | Freq: Once | INTRAMUSCULAR | Status: AC
  Administered 2023-09-27: 20 mL via INTRADERMAL

## 2023-09-27 MED ORDER — SODIUM CHLORIDE 0.9 % IV SOLN: INTRAVENOUS | Status: DC

## 2023-09-27 MED ORDER — MIDAZOLAM HCL 2 MG/2ML IJ SOLN
INTRAMUSCULAR | Status: AC
  Filled 2023-09-27: qty 4

## 2023-09-27 MED ORDER — HEPARIN SOD (PORK) LOCK FLUSH 100 UNIT/ML IV SOLN
500.0000 [IU] | Freq: Once | INTRAVENOUS | Status: AC
  Administered 2023-09-27: 500 [IU] via INTRAVENOUS

## 2023-09-27 NOTE — Procedures (Signed)
 Vascular and Interventional Radiology Procedure Note  Patient: [facility] DOB: 06/20/1951 Medical Record Number: [id number] Note Date/Time: 09/27/23 11:04 AM   Performing Physician: Roanna Banning, MD Assistant(s): None  Diagnosis:  Endometrial CA  Procedure: PORT PLACEMENT  Anesthesia: Conscious Sedation Complications: None Estimated Blood Loss: Minimal  Findings:  Successful right-sided port placement, with the tip of the catheter in the proximal right atrium.  Plan: Catheter ready for use.  See detailed procedure note with images in PACS. The patient tolerated the procedure well without incident or complication and was returned to Recovery in stable condition.    Roanna Banning, MD [facility]   Pager. [phone] Clinic. [phone]

## 2023-09-27 NOTE — Discharge Instructions (Signed)
 Discharge Instructions:   Please call Interventional Radiology clinic [phone] with any questions or concerns.  You may remove your bandaid and dressing and shower tomorrow.  Do not use EMLA / Lidocaine cream for 2 weeks post Port Insertion will remove the surgical glue.   Implanted Port Insertion, Care After  The following information offers guidance on how to care for yourself after your procedure. Your health care provider may also give you more specific instructions. If you have problems or questions, contact your health care provider. What can I expect after the procedure? After the procedure, it is common to have: Discomfort at the port insertion site. Bruising on the skin over the port. This should improve over 3-4 days. Follow these instructions at home: [facility] care After your port is placed, you will get a manufacturer's information card. The card has information about your port. Keep this card with you at all times. Take care of the port as told by your health care provider. Ask your health care provider if you or a family member can get training for taking care of the port at home. A home health care nurse will be be available to help care for the port. Make sure to remember what type of port you have. Incision care     Follow instructions from your health care provider about how to take care of your port insertion site. Make sure you: Wash your hands with soap and water for at least 20 seconds before and after you change your bandage (dressing). If soap and water are not available, use hand sanitizer. Change your dressing as told by your health care provider. Leave stitches (sutures), skin glue, or adhesive strips in place. These skin closures may need to stay in place for 2 weeks or longer. If adhesive strip edges start to loosen and curl up, you may trim the loose edges. Do not remove adhesive strips completely unless your health care provider tells you to do that. Check  your port insertion site every day for signs of infection. Check for: Redness, swelling, or pain. Fluid or blood. Warmth. Pus or a bad smell. Activity Return to your normal activities as told by your health care provider. Ask your health care provider what activities are safe for you. You may have to avoid lifting. Ask your health care provider how much you can safely lift. General instructions Take over-the-counter and prescription medicines only as told by your health care provider. Do not take baths, swim, or use a hot tub until your health care provider approves. Ask your health care provider if you may take showers. You may only be allowed to take sponge baths. If you were given a sedative during the procedure, it can affect you for several hours. Do not drive or operate machinery until your health care provider says that it is safe. Wear a medical alert bracelet in case of an emergency. This will tell any health care providers that you have a port. Keep all follow-up visits. This is important. Contact a health care provider if: You cannot flush your port with saline as directed, or you cannot draw blood from the port. You have a fever or chills. You have redness, swelling, or pain around your port insertion site. You have fluid or blood coming from your port insertion site. Your port insertion site feels warm to the touch. You have pus or a bad smell coming from the port insertion site. Get help right away if: You have chest pain or  shortness of breath. You have bleeding from your port that you cannot control. These symptoms may be an emergency. Get help right away. Call 911. Do not wait to see if the symptoms will go away. Do not drive yourself to the hospital. Summary Take care of the port as told by your health care provider. Keep the manufacturer's information card with you at all times. Change your dressing as told by your health care provider. Contact a health care provider  if you have a fever or chills or if you have redness, swelling, or pain around your port insertion site. Keep all follow-up visits. This information is not intended to replace advice given to you by your health care provider. Make sure you discuss any questions you have with your health care provider. Document Revised: 01/19/2021 Document Reviewed: 01/19/2021 Elsevier Patient Education  2023 [employer]    Moderate Conscious Sedation, Adult, Care After  This sheet gives you information about how to care for yourself after your procedure. Your health care provider may also give you more specific instructions. If you have problems or questions, contact your health care provider. What can I expect after the procedure? After the procedure, it is common to have: Sleepiness for several hours. Impaired judgment for several hours. Difficulty with balance. Vomiting if you eat too soon. Follow these instructions at home: For the time period you were told by your health care provider: Rest. Do not participate in activities where you could fall or become injured. Do not drive or use machinery. Do not drink alcohol. Do not take sleeping pills or medicines that cause drowsiness. Do not make important decisions or sign legal documents. Do not take care of children on your own. Eating and drinking  Follow the diet recommended by your health care provider. Drink enough fluid to keep your urine pale yellow. If you vomit: Drink water, juice, or soup when you can drink without vomiting. Make sure you have little or no nausea before eating solid foods. General instructions Take over-the-counter and prescription medicines only as told by your health care provider. Have a responsible adult stay with you for the time you are told. It is important to have someone help care for you until you are awake and alert. Do not smoke. Keep all follow-up visits as told by your health care provider. This is  important. Contact a health care provider if: You are still sleepy or having trouble with balance after 24 hours. You feel light-headed. You keep feeling nauseous or you keep vomiting. You develop a rash. You have a fever. You have redness or swelling around the IV site. Get help right away if: You have trouble breathing. You have new-onset confusion at home. Summary After the procedure, it is common to feel sleepy, have impaired judgment, or feel nauseous if you eat too soon. Rest after you get home. Know the things you should not do after the procedure. Follow the diet recommended by your health care provider and drink enough fluid to keep your urine pale yellow. Get help right away if you have trouble breathing or new-onset confusion at home. This information is not intended to replace advice given to you by your health care provider. Make sure you discuss any questions you have with your health care provider. Document Revised: 11/15/2019 Document Reviewed: 06/13/2019 Elsevier Patient Education  2023 [employer].

## 2023-09-27 NOTE — Progress Notes (Signed)
 1250 Ice bag given to use as needed to right upper neck and right upper chest.

## 2023-09-29 DIAGNOSIS — R311 Benign essential microscopic hematuria: Secondary | ICD-10-CM | POA: Diagnosis not present

## 2023-09-29 DIAGNOSIS — D414 Neoplasm of uncertain behavior of bladder: Secondary | ICD-10-CM | POA: Diagnosis not present

## 2023-09-29 NOTE — Progress Notes (Signed)
 Pharmacist Chemotherapy Monitoring - Initial Assessment    Anticipated start date: 10/06/23   The following has been reviewed per standard work regarding the patient's treatment regimen: The patient's diagnosis, treatment plan and drug doses, and organ/hematologic function Lab orders and baseline tests specific to treatment regimen  The treatment plan start date, drug sequencing, and pre-medications Prior authorization status  Patient's documented medication list, including drug-drug interaction screen and prescriptions for anti-emetics and supportive care specific to the treatment regimen The drug concentrations, fluid compatibility, administration routes, and timing of the medications to be used The patient's access for treatment and lifetime cumulative dose history, if applicable  The patient's medication allergies and previous infusion related reactions, if applicable   Changes made to treatment plan:  N/A  Follow up needed:  N/A   Stephens Shire, [facility], 09/29/2023  11:31 AM

## 2023-10-05 ENCOUNTER — Other Ambulatory Visit: Payer: Self-pay

## 2023-10-05 MED FILL — Fosaprepitant Dimeglumine For IV Infusion 150 MG (Base Eq): INTRAVENOUS | Qty: 5 | Status: AC

## 2023-10-06 ENCOUNTER — Inpatient Hospital Stay: Payer: PPO | Attending: Hematology and Oncology

## 2023-10-06 ENCOUNTER — Inpatient Hospital Stay: Payer: PPO

## 2023-10-06 ENCOUNTER — Other Ambulatory Visit: Payer: Self-pay | Admitting: Hematology and Oncology

## 2023-10-06 VITALS — BP 128/76 | HR 69 | Temp 97.9°F | Resp 17 | Wt 118.5 lb

## 2023-10-06 DIAGNOSIS — G252 Other specified forms of tremor: Secondary | ICD-10-CM | POA: Insufficient documentation

## 2023-10-06 DIAGNOSIS — R5381 Other malaise: Secondary | ICD-10-CM | POA: Diagnosis not present

## 2023-10-06 DIAGNOSIS — R110 Nausea: Secondary | ICD-10-CM | POA: Diagnosis not present

## 2023-10-06 DIAGNOSIS — Z79899 Other long term (current) drug therapy: Secondary | ICD-10-CM | POA: Diagnosis not present

## 2023-10-06 DIAGNOSIS — E538 Deficiency of other specified B group vitamins: Secondary | ICD-10-CM | POA: Insufficient documentation

## 2023-10-06 DIAGNOSIS — C541 Malignant neoplasm of endometrium: Secondary | ICD-10-CM

## 2023-10-06 DIAGNOSIS — Z5111 Encounter for antineoplastic chemotherapy: Secondary | ICD-10-CM | POA: Diagnosis not present

## 2023-10-06 DIAGNOSIS — Z5112 Encounter for antineoplastic immunotherapy: Secondary | ICD-10-CM | POA: Diagnosis not present

## 2023-10-06 DIAGNOSIS — R109 Unspecified abdominal pain: Secondary | ICD-10-CM | POA: Diagnosis not present

## 2023-10-06 DIAGNOSIS — D7589 Other specified diseases of blood and blood-forming organs: Secondary | ICD-10-CM

## 2023-10-06 LAB — IRON AND IRON BINDING CAPACITY (CC-WL,HP ONLY)
Iron: 61 ug/dL (ref 28–170)
Saturation Ratios: 20 % (ref 10.4–31.8)
TIBC: 308 ug/dL (ref 250–450)
UIBC: 247 ug/dL (ref 148–442)

## 2023-10-06 LAB — CBC WITH DIFFERENTIAL (CANCER CENTER ONLY)
Abs Immature Granulocytes: 0.02 10*3/uL (ref 0.00–0.07)
Basophils Absolute: 0 10*3/uL (ref 0.0–0.1)
Basophils Relative: 0 %
Eosinophils Absolute: 0 10*3/uL (ref 0.0–0.5)
Eosinophils Relative: 0 %
HCT: 35.6 % — ABNORMAL LOW (ref 36.0–46.0)
Hemoglobin: 12.2 g/dL (ref 12.0–15.0)
Immature Granulocytes: 0 %
Lymphocytes Relative: 9 %
Lymphs Abs: 0.6 10*3/uL — ABNORMAL LOW (ref 0.7–4.0)
MCH: 34.9 pg — ABNORMAL HIGH (ref 26.0–34.0)
MCHC: 34.3 g/dL (ref 30.0–36.0)
MCV: 101.7 fL — ABNORMAL HIGH (ref 80.0–100.0)
Monocytes Absolute: 0.2 10*3/uL (ref 0.1–1.0)
Monocytes Relative: 3 %
Neutro Abs: 5.7 10*3/uL (ref 1.7–7.7)
Neutrophils Relative %: 88 %
Platelet Count: 304 10*3/uL (ref 150–400)
RBC: 3.5 MIL/uL — ABNORMAL LOW (ref 3.87–5.11)
RDW: 13.2 % (ref 11.5–15.5)
WBC Count: 6.6 10*3/uL (ref 4.0–10.5)
nRBC: 0 % (ref 0.0–0.2)

## 2023-10-06 LAB — CMP (CANCER CENTER ONLY)
ALT: 19 U/L (ref 0–44)
AST: 19 U/L (ref 15–41)
Albumin: 4.1 g/dL (ref 3.5–5.0)
Alkaline Phosphatase: 107 U/L (ref 38–126)
Anion gap: 9 (ref 5–15)
BUN: 19 mg/dL (ref 8–23)
CO2: 24 mmol/L (ref 22–32)
Calcium: 9.6 mg/dL (ref 8.9–10.3)
Chloride: 104 mmol/L (ref 98–111)
Creatinine: 0.97 mg/dL (ref 0.44–1.00)
GFR, Estimated: >60 mL/min (ref >60)
Glucose, Bld: 193 mg/dL — ABNORMAL HIGH (ref 70–99)
Potassium: 3.8 mmol/L (ref 3.5–5.1)
Sodium: 137 mmol/L (ref 135–145)
Total Bilirubin: 0.4 mg/dL (ref 0.0–1.2)
Total Protein: 7 g/dL (ref 6.5–8.1)

## 2023-10-06 LAB — FERRITIN: Ferritin: 56 ng/mL (ref 11–307)

## 2023-10-06 LAB — VITAMIN B12: Vitamin B-12: 159 pg/mL — ABNORMAL LOW (ref 180–914)

## 2023-10-06 LAB — TSH: TSH: 0.531 u[IU]/mL (ref 0.350–4.500)

## 2023-10-06 MED ORDER — CYANOCOBALAMIN 1000 MCG/ML IJ SOLN
1000.0000 ug | Freq: Once | INTRAMUSCULAR | Status: AC
  Administered 2023-10-06: 1000 ug via INTRAMUSCULAR
  Filled 2023-10-06: qty 1

## 2023-10-06 MED ORDER — SODIUM CHLORIDE 0.9 % IV SOLN
348.0000 mg | Freq: Once | INTRAVENOUS | Status: AC
  Administered 2023-10-06: 350 mg via INTRAVENOUS
  Filled 2023-10-06: qty 35

## 2023-10-06 MED ORDER — SODIUM CHLORIDE 0.9 % IV SOLN
105.0000 mg/m2 | Freq: Once | INTRAVENOUS | Status: AC
  Administered 2023-10-06: 162 mg via INTRAVENOUS
  Filled 2023-10-06: qty 27

## 2023-10-06 MED ORDER — DEXAMETHASONE SODIUM PHOSPHATE 10 MG/ML IJ SOLN
10.0000 mg | Freq: Once | INTRAMUSCULAR | Status: AC
  Administered 2023-10-06: 10 mg via INTRAVENOUS
  Filled 2023-10-06: qty 1

## 2023-10-06 MED ORDER — CETIRIZINE HCL 10 MG/ML IV SOLN
10.0000 mg | Freq: Once | INTRAVENOUS | Status: AC
  Administered 2023-10-06: 10 mg via INTRAVENOUS
  Filled 2023-10-06: qty 1

## 2023-10-06 MED ORDER — HEPARIN SOD (PORK) LOCK FLUSH 100 UNIT/ML IV SOLN
500.0000 [IU] | Freq: Once | INTRAVENOUS | Status: AC | PRN
  Administered 2023-10-06: 500 [IU]

## 2023-10-06 MED ORDER — SODIUM CHLORIDE 0.9% FLUSH
10.0000 mL | INTRAVENOUS | Status: DC | PRN
Start: 2023-10-06 — End: 2023-10-06
  Administered 2023-10-06: 10 mL

## 2023-10-06 MED ORDER — SODIUM CHLORIDE 0.9 % IV SOLN: INTRAVENOUS | Status: DC

## 2023-10-06 MED ORDER — SODIUM CHLORIDE 0.9 % IV SOLN
500.0000 mg | Freq: Once | INTRAVENOUS | Status: AC
  Administered 2023-10-06: 500 mg via INTRAVENOUS
  Filled 2023-10-06: qty 10

## 2023-10-06 MED ORDER — SODIUM CHLORIDE 0.9% FLUSH
10.0000 mL | Freq: Once | INTRAVENOUS | Status: AC
Start: 2023-10-06 — End: 2023-10-06
  Administered 2023-10-06: 10 mL

## 2023-10-06 MED ORDER — PALONOSETRON HCL INJECTION 0.25 MG/5ML
0.2500 mg | Freq: Once | INTRAVENOUS | Status: AC
  Administered 2023-10-06: 0.25 mg via INTRAVENOUS
  Filled 2023-10-06: qty 5

## 2023-10-06 MED ORDER — SODIUM CHLORIDE 0.9 % IV SOLN
150.0000 mg | Freq: Once | INTRAVENOUS | Status: AC
  Administered 2023-10-06: 150 mg via INTRAVENOUS
  Filled 2023-10-06: qty 150

## 2023-10-06 MED ORDER — FAMOTIDINE IN NACL 20-0.9 MG/50ML-% IV SOLN
20.0000 mg | Freq: Once | INTRAVENOUS | Status: AC
  Administered 2023-10-06: 20 mg via INTRAVENOUS
  Filled 2023-10-06: qty 50

## 2023-10-06 NOTE — Patient Instructions (Signed)
 [facility]  Discharge Instructions: Thank you for choosing [facility] to provide your oncology and hematology care.   If you have a lab appointment with the Cancer Center, please go directly to the Cancer Center and check in at the registration area.   Wear comfortable clothing and clothing appropriate for easy access to any Portacath or PICC line.   We strive to give you quality time with your provider. You may need to reschedule your appointment if you arrive late (15 or more minutes).  Arriving late affects you and other patients whose appointments are after yours.  Also, if you miss three or more appointments without notifying the office, you may be dismissed from the clinic at the provider's discretion.      For prescription refill requests, have your pharmacy contact our office and allow 72 hours for refills to be completed.    Today you received the following chemotherapy and/or immunotherapy agents Jemperli, Taxol & Carboplatin      To help prevent nausea and vomiting after your treatment, we encourage you to take your nausea medication as directed.  BELOW ARE SYMPTOMS THAT SHOULD BE REPORTED IMMEDIATELY: *FEVER GREATER THAN 100.4 F (38 C) OR HIGHER *CHILLS OR SWEATING *NAUSEA AND VOMITING THAT IS NOT CONTROLLED WITH YOUR NAUSEA MEDICATION *UNUSUAL SHORTNESS OF BREATH *UNUSUAL BRUISING OR BLEEDING *URINARY PROBLEMS (pain or burning when urinating, or frequent urination) *BOWEL PROBLEMS (unusual diarrhea, constipation, pain near the anus) TENDERNESS IN MOUTH AND THROAT WITH OR WITHOUT PRESENCE OF ULCERS (sore throat, sores in mouth, or a toothache) UNUSUAL RASH, SWELLING OR PAIN  UNUSUAL VAGINAL DISCHARGE OR ITCHING   Items with * indicate a potential emergency and should be followed up as soon as possible or go to the Emergency Department if any problems should occur.  Please show the CHEMOTHERAPY ALERT  CARD or IMMUNOTHERAPY ALERT CARD at check-in to the Emergency Department and triage nurse.  Should you have questions after your visit or need to cancel or reschedule your appointment, please contact [facility]  Dept: [phone]  and follow the prompts.  Office hours are 8:00 a.m. to 4:30 p.m. Monday - Friday. Please note that voicemails left after 4:00 p.m. may not be returned until the following business day.  We are closed weekends and major holidays. You have access to a nurse at all times for urgent questions. Please call the main number to the clinic Dept: [phone] and follow the prompts.   For any non-urgent questions, you may also contact your provider using MyChart. We now offer e-Visits for anyone 14 and older to request care online for non-urgent symptoms. For details visit [email or web address].   Also download the MyChart app! Go to the app store, search "MyChart", open the app, select [place], and log in with your MyChart username and password.  Dostarlimab Injection What is this medication? DOSTARLIMAB (dos tar li mab) treats some types of cancer. It works by helping your immune system slow or stop the spread of cancer cells. It is a monoclonal antibody. This medicine may be used for other purposes; ask your health care provider or pharmacist if you have questions. COMMON BRAND NAME(S): Jemperli What should I tell my care team before I take this medication? They need to know if you have any of these conditions: Allogeneic stem cell transplant (uses someone else's  stem cells) Autoimmune diseases, such as Crohn disease, ulcerative colitis, lupus History of chest radiation Nervous system problems, such as Guillain-Barre syndrome, myasthenia gravis Organ transplant An unusual or allergic reaction to dostarlimab, other medications, foods, dyes, or preservatives Pregnant or trying to get pregnant Breast-feeding How  should I use this medication? This medication is injected into a vein. It is given by your care team in a hospital or clinic setting. A special MedGuide will be given to you before each treatment. Be sure to read this information carefully each time. Talk to your care team about the use of this medication in children. Special care may be needed. Overdosage: If you think you have taken too much of this medicine contact a poison control center or emergency room at once. NOTE: This medicine is only for you. Do not share this medicine with others. What if I miss a dose? Keep appointments for follow-up doses. It is important not to miss your dose. Call your care team if you are unable to keep an appointment. What may interact with this medication? Interactions have not been studied. This list may not describe all possible interactions. Give your health care provider a list of all the medicines, herbs, non-prescription drugs, or dietary supplements you use. Also tell them if you smoke, drink alcohol, or use illegal drugs. Some items may interact with your medicine. What should I watch for while using this medication? Your condition will be monitored carefully while you are receiving this medication. You may need blood work while taking this medication. This medication may cause serious skin reactions. They can happen weeks to months after starting the medication. Contact your care team right away if you notice fevers or flu-like symptoms with a rash. The rash may be red or purple and then turn into blisters or peeling of the skin. You may also notice a red rash with swelling of the face, lips, or lymph nodes in your neck or under your arms. Tell your care team right away if you have any change in your eyesight. Talk to your care team if you may be pregnant. Serious birth defects can occur if you take this medication during pregnancy and for 4 months after the last dose. You will need a negative pregnancy  test before starting this medication. Contraception is recommended while taking this medication and for 4 months after the last dose. Your care team can help you find the option that works for you. Do not breastfeed while taking this medication and for 4 months after the last dose. What side effects may I notice from receiving this medication? Side effects that you should report to your care team as soon as possible: Allergic reactions--skin rash, itching, hives, swelling of the face, lips, tongue, or throat Dry cough, shortness of breath or trouble breathing Eye pain, redness, irritation, or discharge with blurry or decreased vision Heart muscle inflammation--unusual weakness or fatigue, shortness of breath, chest pain, fast or irregular heartbeat, dizziness, swelling of the ankles, feet, or hands Hormone gland problems--headache, sensitivity to light, unusual weakness or fatigue, dizziness, fast or irregular heartbeat, increased sensitivity to cold or heat, excessive sweating, constipation, hair loss, increased thirst or amount of urine, tremors or shaking, irritability Infusion reactions--chest pain, shortness of breath or trouble breathing, feeling faint or lightheaded Kidney injury (glomerulonephritis)--decrease in the amount of urine, red or dark brown urine, foamy or bubbly urine, swelling of the ankles, hands, or feet Liver injury--right upper belly pain, loss of appetite, nausea, light-colored stool,  dark yellow or brown urine, yellowing skin or eyes, unusual weakness or fatigue Pain, tingling, or numbness in the hands or feet, muscle weakness, change in vision, confusion or trouble speaking, loss of balance or coordination, trouble walking, seizures Rash, fever, and swollen lymph nodes Redness, blistering, peeling, or loosening of the skin, including inside the mouth Sudden or severe stomach pain, bloody diarrhea, fever, nausea, vomiting Side effects that usually do not require medical  attention (report these to your care team if they continue or are bothersome): Bone, joint, or muscle pain Diarrhea Fatigue Loss of appetite Nausea Skin rash This list may not describe all possible side effects. Call your doctor for medical advice about side effects. You may report side effects to FDA at 1-800-FDA-1088. Where should I keep my medication? This medication is given in a hospital or clinic. It will not be stored at home. NOTE: This sheet is a summary. It may not cover all possible information. If you have questions about this medicine, talk to your doctor, pharmacist, or health care provider.  2024 Elsevier/Gold Standard (2021-12-03 00:00:00)  Paclitaxel Injection What is this medication? PACLITAXEL (PAK li TAX el) treats some types of cancer. It works by slowing down the growth of cancer cells. This medicine may be used for other purposes; ask your health care provider or pharmacist if you have questions. COMMON BRAND NAME(S): Onxol, Taxol What should I tell my care team before I take this medication? They need to know if you have any of these conditions: Heart disease Liver disease Low white blood cell levels An unusual or allergic reaction to paclitaxel, other medications, foods, dyes, or preservatives If you or your partner are pregnant or trying to get pregnant Breast-feeding How should I use this medication? This medication is injected into a vein. It is given by your care team in a hospital or clinic setting. Talk to your care team about the use of this medication in children. While it may be given to children for selected conditions, precautions do apply. Overdosage: If you think you have taken too much of this medicine contact a poison control center or emergency room at once. NOTE: This medicine is only for you. Do not share this medicine with others. What if I miss a dose? Keep appointments for follow-up doses. It is important not to miss your dose. Call your  care team if you are unable to keep an appointment. What may interact with this medication? Do not take this medication with any of the following: Live virus vaccines Other medications may affect the way this medication works. Talk with your care team about all of the medications you take. They may suggest changes to your treatment plan to lower the risk of side effects and to make sure your medications work as intended. This list may not describe all possible interactions. Give your health care provider a list of all the medicines, herbs, non-prescription drugs, or dietary supplements you use. Also tell them if you smoke, drink alcohol, or use illegal drugs. Some items may interact with your medicine. What should I watch for while using this medication? Your condition will be monitored carefully while you are receiving this medication. You may need blood work while taking this medication. This medication may make you feel generally unwell. This is not uncommon as chemotherapy can affect healthy cells as well as cancer cells. Report any side effects. Continue your course of treatment even though you feel ill unless your care team tells you to stop.  This medication can cause serious allergic reactions. To reduce the risk, your care team may give you other medications to take before receiving this one. Be sure to follow the directions from your care team. This medication may increase your risk of getting an infection. Call your care team for advice if you get a fever, chills, sore throat, or other symptoms of a cold or flu. Do not treat yourself. Try to avoid being around people who are sick. This medication may increase your risk to bruise or bleed. Call your care team if you notice any unusual bleeding. Be careful brushing or flossing your teeth or using a toothpick because you may get an infection or bleed more easily. If you have any dental work done, tell your dentist you are receiving this  medication. Talk to your care team if you may be pregnant. Serious birth defects can occur if you take this medication during pregnancy. Talk to your care team before breastfeeding. Changes to your treatment plan may be needed. What side effects may I notice from receiving this medication? Side effects that you should report to your care team as soon as possible: Allergic reactions--skin rash, itching, hives, swelling of the face, lips, tongue, or throat Heart rhythm changes--fast or irregular heartbeat, dizziness, feeling faint or lightheaded, chest pain, trouble breathing Increase in blood pressure Infection--fever, chills, cough, sore throat, wounds that don't heal, pain or trouble when passing urine, general feeling of discomfort or being unwell Low blood pressure--dizziness, feeling faint or lightheaded, blurry vision Low red blood cell level--unusual weakness or fatigue, dizziness, headache, trouble breathing Painful swelling, warmth, or redness of the skin, blisters or sores at the infusion site Pain, tingling, or numbness in the hands or feet Slow heartbeat--dizziness, feeling faint or lightheaded, confusion, trouble breathing, unusual weakness or fatigue Unusual bruising or bleeding Side effects that usually do not require medical attention (report to your care team if they continue or are bothersome): Diarrhea Hair loss Joint pain Loss of appetite Muscle pain Nausea Vomiting This list may not describe all possible side effects. Call your doctor for medical advice about side effects. You may report side effects to FDA at 1-800-FDA-1088. Where should I keep my medication? This medication is given in a hospital or clinic. It will not be stored at home. NOTE: This sheet is a summary. It may not cover all possible information. If you have questions about this medicine, talk to your doctor, pharmacist, or health care provider.  2024 Elsevier/Gold Standard (2021-12-07  00:00:00)  Carboplatin Injection What is this medication? CARBOPLATIN (KAR boe pla tin) treats some types of cancer. It works by slowing down the growth of cancer cells. This medicine may be used for other purposes; ask your health care provider or pharmacist if you have questions. COMMON BRAND NAME(S): Paraplatin What should I tell my care team before I take this medication? They need to know if you have any of these conditions: Blood disorders Hearing problems Kidney disease Recent or ongoing radiation therapy An unusual or allergic reaction to carboplatin, cisplatin, other medications, foods, dyes, or preservatives Pregnant or trying to get pregnant Breast-feeding How should I use this medication? This medication is injected into a vein. It is given by your care team in a hospital or clinic setting. Talk to your care team about the use of this medication in children. Special care may be needed. Overdosage: If you think you have taken too much of this medicine contact a poison control center or emergency room  at once. NOTE: This medicine is only for you. Do not share this medicine with others. What if I miss a dose? Keep appointments for follow-up doses. It is important not to miss your dose. Call your care team if you are unable to keep an appointment. What may interact with this medication? Medications for seizures Some antibiotics, such as amikacin, gentamicin, neomycin, streptomycin, tobramycin Vaccines This list may not describe all possible interactions. Give your health care provider a list of all the medicines, herbs, non-prescription drugs, or dietary supplements you use. Also tell them if you smoke, drink alcohol, or use illegal drugs. Some items may interact with your medicine. What should I watch for while using this medication? Your condition will be monitored carefully while you are receiving this medication. You may need blood work while taking this medication. This  medication may make you feel generally unwell. This is not uncommon, as chemotherapy can affect healthy cells as well as cancer cells. Report any side effects. Continue your course of treatment even though you feel ill unless your care team tells you to stop. In some cases, you may be given additional medications to help with side effects. Follow all directions for their use. This medication may increase your risk of getting an infection. Call your care team for advice if you get a fever, chills, sore throat, or other symptoms of a cold or flu. Do not treat yourself. Try to avoid being around people who are sick. Avoid taking medications that contain aspirin, acetaminophen, ibuprofen, naproxen, or ketoprofen unless instructed by your care team. These medications may hide a fever. Be careful brushing or flossing your teeth or using a toothpick because you may get an infection or bleed more easily. If you have any dental work done, tell your dentist you are receiving this medication. Talk to your care team if you wish to become pregnant or think you might be pregnant. This medication can cause serious birth defects. Talk to your care team about effective forms of contraception. Do not breast-feed while taking this medication. What side effects may I notice from receiving this medication? Side effects that you should report to your care team as soon as possible: Allergic reactions--skin rash, itching, hives, swelling of the face, lips, tongue, or throat Infection--fever, chills, cough, sore throat, wounds that don't heal, pain or trouble when passing urine, general feeling of discomfort or being unwell Low red blood cell level--unusual weakness or fatigue, dizziness, headache, trouble breathing Pain, tingling, or numbness in the hands or feet, muscle weakness, change in vision, confusion or trouble speaking, loss of balance or coordination, trouble walking, seizures Unusual bruising or bleeding Side  effects that usually do not require medical attention (report to your care team if they continue or are bothersome): Hair loss Nausea Unusual weakness or fatigue Vomiting This list may not describe all possible side effects. Call your doctor for medical advice about side effects. You may report side effects to FDA at 1-800-FDA-1088. Where should I keep my medication? This medication is given in a hospital or clinic. It will not be stored at home. NOTE: This sheet is a summary. It may not cover all possible information. If you have questions about this medicine, talk to your doctor, pharmacist, or health care provider.  2024 Elsevier/Gold Standard (2021-11-09 00:00:00)

## 2023-10-07 LAB — T4: T4, Total: 9.7 ug/dL (ref 4.5–12.0)

## 2023-10-09 ENCOUNTER — Telehealth: Payer: Self-pay

## 2023-10-09 NOTE — Telephone Encounter (Signed)
-----   Message from Nurse Julieanne Manson sent at 10/06/2023  3:40 PM EST ----- Regarding: Jacqueline Hammond 1st Tx F/U call - Jemperli, Taxol, Carbo Gorsuch 1st Tx F/U call - Jemperli, Taxol, Carbo.  Tolerated infusion well.

## 2023-10-09 NOTE — Telephone Encounter (Signed)
 Jacqueline Hammond states that she is doing fine. She is eating, drinking, and urinating well. She knows to call the office at [phone] if  she has any questions or concerns.

## 2023-10-12 DIAGNOSIS — C541 Malignant neoplasm of endometrium: Secondary | ICD-10-CM | POA: Diagnosis not present

## 2023-10-12 DIAGNOSIS — C779 Secondary and unspecified malignant neoplasm of lymph node, unspecified: Secondary | ICD-10-CM | POA: Diagnosis not present

## 2023-10-18 ENCOUNTER — Encounter: Payer: Self-pay | Admitting: Hematology and Oncology

## 2023-10-26 MED FILL — Fosaprepitant Dimeglumine For IV Infusion 150 MG (Base Eq): INTRAVENOUS | Qty: 5 | Status: AC

## 2023-10-27 ENCOUNTER — Inpatient Hospital Stay: Payer: PPO

## 2023-10-27 ENCOUNTER — Inpatient Hospital Stay: Admitting: Physician Assistant

## 2023-10-27 ENCOUNTER — Encounter: Payer: Self-pay | Admitting: Hematology and Oncology

## 2023-10-27 ENCOUNTER — Inpatient Hospital Stay: Payer: PPO | Admitting: Hematology and Oncology

## 2023-10-27 VITALS — BP 120/72 | HR 94 | Temp 97.7°F | Resp 16

## 2023-10-27 VITALS — BP 120/72 | HR 94 | Resp 16

## 2023-10-27 VITALS — BP 134/88 | HR 116 | Temp 97.9°F | Resp 18 | Ht 62.0 in | Wt 120.0 lb

## 2023-10-27 DIAGNOSIS — C541 Malignant neoplasm of endometrium: Secondary | ICD-10-CM

## 2023-10-27 DIAGNOSIS — R5381 Other malaise: Secondary | ICD-10-CM

## 2023-10-27 DIAGNOSIS — D6481 Anemia due to antineoplastic chemotherapy: Secondary | ICD-10-CM | POA: Diagnosis not present

## 2023-10-27 DIAGNOSIS — R000 Tachycardia, unspecified: Secondary | ICD-10-CM

## 2023-10-27 DIAGNOSIS — T451X5A Adverse effect of antineoplastic and immunosuppressive drugs, initial encounter: Secondary | ICD-10-CM | POA: Diagnosis not present

## 2023-10-27 DIAGNOSIS — T50905A Adverse effect of unspecified drugs, medicaments and biological substances, initial encounter: Secondary | ICD-10-CM

## 2023-10-27 DIAGNOSIS — Z5112 Encounter for antineoplastic immunotherapy: Secondary | ICD-10-CM | POA: Diagnosis not present

## 2023-10-27 LAB — CBC WITH DIFFERENTIAL (CANCER CENTER ONLY)
Abs Immature Granulocytes: 0.01 10*3/uL (ref 0.00–0.07)
Basophils Absolute: 0 10*3/uL (ref 0.0–0.1)
Basophils Relative: 0 %
Eosinophils Absolute: 0 10*3/uL (ref 0.0–0.5)
Eosinophils Relative: 0 %
HCT: 34.5 % — ABNORMAL LOW (ref 36.0–46.0)
Hemoglobin: 11.6 g/dL — ABNORMAL LOW (ref 12.0–15.0)
Immature Granulocytes: 0 %
Lymphocytes Relative: 11 %
Lymphs Abs: 0.6 10*3/uL — ABNORMAL LOW (ref 0.7–4.0)
MCH: 35 pg — ABNORMAL HIGH (ref 26.0–34.0)
MCHC: 33.6 g/dL (ref 30.0–36.0)
MCV: 104.2 fL — ABNORMAL HIGH (ref 80.0–100.0)
Monocytes Absolute: 0.3 10*3/uL (ref 0.1–1.0)
Monocytes Relative: 5 %
Neutro Abs: 4.5 10*3/uL (ref 1.7–7.7)
Neutrophils Relative %: 84 %
Platelet Count: 221 10*3/uL (ref 150–400)
RBC: 3.31 MIL/uL — ABNORMAL LOW (ref 3.87–5.11)
RDW: 13.5 % (ref 11.5–15.5)
WBC Count: 5.4 10*3/uL (ref 4.0–10.5)
nRBC: 0 % (ref 0.0–0.2)

## 2023-10-27 LAB — CMP (CANCER CENTER ONLY)
ALT: 18 U/L (ref 0–44)
AST: 17 U/L (ref 15–41)
Albumin: 3.9 g/dL (ref 3.5–5.0)
Alkaline Phosphatase: 114 U/L (ref 38–126)
Anion gap: 9 (ref 5–15)
BUN: 23 mg/dL (ref 8–23)
CO2: 24 mmol/L (ref 22–32)
Calcium: 9.6 mg/dL (ref 8.9–10.3)
Chloride: 105 mmol/L (ref 98–111)
Creatinine: 0.9 mg/dL (ref 0.44–1.00)
GFR, Estimated: >60 mL/min (ref >60)
Glucose, Bld: 210 mg/dL — ABNORMAL HIGH (ref 70–99)
Potassium: 3.9 mmol/L (ref 3.5–5.1)
Sodium: 138 mmol/L (ref 135–145)
Total Bilirubin: 0.3 mg/dL (ref 0.0–1.2)
Total Protein: 7.1 g/dL (ref 6.5–8.1)

## 2023-10-27 MED ORDER — FAMOTIDINE IN NACL 20-0.9 MG/50ML-% IV SOLN
20.0000 mg | Freq: Once | INTRAVENOUS | Status: AC
  Administered 2023-10-27: 20 mg via INTRAVENOUS
  Filled 2023-10-27: qty 50

## 2023-10-27 MED ORDER — CYANOCOBALAMIN 1000 MCG/ML IJ SOLN
1000.0000 ug | Freq: Once | INTRAMUSCULAR | Status: AC
Start: 2023-10-27 — End: 2023-10-27
  Administered 2023-10-27: 1000 ug via INTRAMUSCULAR
  Filled 2023-10-27: qty 1

## 2023-10-27 MED ORDER — SODIUM CHLORIDE 0.9 % IV SOLN: Freq: Once | INTRAVENOUS | Status: DC | PRN

## 2023-10-27 MED ORDER — SODIUM CHLORIDE 0.9 % IV SOLN
500.0000 mg | Freq: Once | INTRAVENOUS | Status: AC
  Administered 2023-10-27: 500 mg via INTRAVENOUS
  Filled 2023-10-27: qty 10

## 2023-10-27 MED ORDER — METHYLPREDNISOLONE SODIUM SUCC 125 MG IJ SOLR
62.5000 mg | Freq: Once | INTRAMUSCULAR | Status: AC
  Administered 2023-10-27: 125 mg via INTRAVENOUS

## 2023-10-27 MED ORDER — CETIRIZINE HCL 10 MG/ML IV SOLN
10.0000 mg | Freq: Once | INTRAVENOUS | Status: AC
  Administered 2023-10-27: 10 mg via INTRAVENOUS
  Filled 2023-10-27: qty 1

## 2023-10-27 MED ORDER — SODIUM CHLORIDE 0.9% FLUSH
10.0000 mL | Freq: Once | INTRAVENOUS | Status: AC
  Administered 2023-10-27: 10 mL

## 2023-10-27 MED ORDER — SODIUM CHLORIDE 0.9 % IV SOLN: INTRAVENOUS | Status: DC

## 2023-10-27 MED ORDER — FAMOTIDINE IN NACL 20-0.9 MG/50ML-% IV SOLN
20.0000 mg | Freq: Once | INTRAVENOUS | Status: AC | PRN
  Administered 2023-10-27: 20 mg via INTRAVENOUS

## 2023-10-27 MED ORDER — PALONOSETRON HCL INJECTION 0.25 MG/5ML
0.2500 mg | Freq: Once | INTRAVENOUS | Status: AC
  Administered 2023-10-27: 0.25 mg via INTRAVENOUS
  Filled 2023-10-27: qty 5

## 2023-10-27 MED ORDER — SODIUM CHLORIDE 0.9 % IV SOLN
105.0000 mg/m2 | Freq: Once | INTRAVENOUS | Status: AC
  Administered 2023-10-27: 162 mg via INTRAVENOUS
  Filled 2023-10-27: qty 27

## 2023-10-27 MED ORDER — SODIUM CHLORIDE 0.9 % IV SOLN
348.0000 mg | Freq: Once | INTRAVENOUS | Status: AC
  Administered 2023-10-27: 350 mg via INTRAVENOUS
  Filled 2023-10-27: qty 35

## 2023-10-27 MED ORDER — DEXAMETHASONE SODIUM PHOSPHATE 10 MG/ML IJ SOLN
10.0000 mg | Freq: Once | INTRAMUSCULAR | Status: AC
  Administered 2023-10-27: 10 mg via INTRAVENOUS
  Filled 2023-10-27: qty 1

## 2023-10-27 MED ORDER — SODIUM CHLORIDE 0.9 % IV SOLN
150.0000 mg | Freq: Once | INTRAVENOUS | Status: AC
  Administered 2023-10-27: 150 mg via INTRAVENOUS
  Filled 2023-10-27: qty 150

## 2023-10-27 NOTE — Patient Instructions (Signed)
 [facility]  Discharge Instructions: Thank you for choosing [facility] to provide your oncology and hematology care.   If you have a lab appointment with the Cancer Center, please go directly to the Cancer Center and check in at the registration area.   Wear comfortable clothing and clothing appropriate for easy access to any Portacath or PICC line.   We strive to give you quality time with your provider. You may need to reschedule your appointment if you arrive late (15 or more minutes).  Arriving late affects you and other patients whose appointments are after yours.  Also, if you miss three or more appointments without notifying the office, you may be dismissed from the clinic at the provider's discretion.      For prescription refill requests, have your pharmacy contact our office and allow 72 hours for refills to be completed.    Today you received the following chemotherapy and/or immunotherapy agents Jemperli, Taxol & Carboplatin      To help prevent nausea and vomiting after your treatment, we encourage you to take your nausea medication as directed.  BELOW ARE SYMPTOMS THAT SHOULD BE REPORTED IMMEDIATELY: *FEVER GREATER THAN 100.4 F (38 C) OR HIGHER *CHILLS OR SWEATING *NAUSEA AND VOMITING THAT IS NOT CONTROLLED WITH YOUR NAUSEA MEDICATION *UNUSUAL SHORTNESS OF BREATH *UNUSUAL BRUISING OR BLEEDING *URINARY PROBLEMS (pain or burning when urinating, or frequent urination) *BOWEL PROBLEMS (unusual diarrhea, constipation, pain near the anus) TENDERNESS IN MOUTH AND THROAT WITH OR WITHOUT PRESENCE OF ULCERS (sore throat, sores in mouth, or a toothache) UNUSUAL RASH, SWELLING OR PAIN  UNUSUAL VAGINAL DISCHARGE OR ITCHING   Items with * indicate a potential emergency and should be followed up as soon as possible or go to the Emergency Department if any problems should occur.  Please show the CHEMOTHERAPY ALERT  CARD or IMMUNOTHERAPY ALERT CARD at check-in to the Emergency Department and triage nurse.  Should you have questions after your visit or need to cancel or reschedule your appointment, please contact [facility]  Dept: [phone]  and follow the prompts.  Office hours are 8:00 a.m. to 4:30 p.m. Monday - Friday. Please note that voicemails left after 4:00 p.m. may not be returned until the following business day.  We are closed weekends and major holidays. You have access to a nurse at all times for urgent questions. Please call the main number to the clinic Dept: [phone] and follow the prompts.   For any non-urgent questions, you may also contact your provider using MyChart. We now offer e-Visits for anyone 16 and older to request care online for non-urgent symptoms. For details visit [email or web address].   Also download the MyChart app! Go to the app store, search "MyChart", open the app, select [place], and log in with your MyChart username and password.

## 2023-10-27 NOTE — Progress Notes (Signed)
 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 and had surgery, chemotherapy and radiation treatment She is noted to have disease relapse in January 2025 with metastatic disease to supraclavicular lymphadenopathy, axillary lymphadenopathy and abdominal lymphadenopathy  Pathology: Recurrent high grade serous,CARIS [id number] p53 mutated, MSI stable, pMMR, BRCA neg, HEr2/Neu 2+ ER neg, Her2/neu 1+, PDL1 0%  She received cycle 1 of palliative chemotherapy early March 2025 with minimum side effects of treatment I go through the history with the patient again as she had many questions I gave her a copy of her pathology so that she can file additional insurance claim for benefits I plan to repeat imaging study after cycle 3 of treatment Anemia due to antineoplastic chemotherapy This is likely due to recent treatment. The patient denies recent history of bleeding such as epistaxis, hematuria or hematochezia. She is asymptomatic from the anemia. I will observe for now.  She does not require transfusion now. I will continue the chemotherapy at current dose without dosage adjustment.  If the anemia gets progressive worse in the future, I might have to delay her treatment or adjust the chemotherapy dose.  Physical debility She is somewhat debilitated by treatment I completed application for disability parking permit for the patient today  No orders of the defined types were placed in this encounter.    Artis Delay, MD  INTERVAL HISTORY: she returns for treatment follow-up Complications related to previous cycle of chemotherapy included anemia, and fatigue, Her husband had many questions related to her diagnosis She requested disability  parking permit I also gave her copies of her molecular test and review PET/CT imaging with the patient and her husband today  PHYSICAL EXAMINATION: ECOG PERFORMANCE STATUS: 1 - Symptomatic but completely ambulatory  Vitals:   10/27/23 0851  BP: 134/88  Pulse: (!) 116  Resp: 18  Temp: 97.9 F (36.6 C)  SpO2: 100%   Filed Weights   10/27/23 0851  Weight: 120 lb (54.4 kg)    Relevant data reviewed during this visit included CBC and CMP

## 2023-10-27 NOTE — Progress Notes (Signed)
 1525: Patients husband notified this nurse that patient was "feeling sick in the bathroom." Upon assessment patient is noted to be having a bowel movement, shaky, and red palms/face. Patients husband states that this happens at home and once she has a bowel movement the symptoms resolve.  1526Jae Dire, PA notified and in bathroom to assess patient. Lowella Bandy, RN assisting this nurse with medication administration. Normal Saline started at 931ml/hr and 20 mg Pepcid administered.  1530: Patient states she is starting to return to baseline. Patient had small bowel movement. Patient states she thinks her symptoms are from a "greasy burger."  1542: Patient requested to lay down. Patient laying down and warm blankets applied. Patient states "I don't feel like I could run a marathon but I do feel better."  1557: Report given to Alycia Rossetti, [place]

## 2023-10-27 NOTE — Assessment & Plan Note (Addendum)
 This is likely due to recent treatment. The patient denies recent history of bleeding such as epistaxis, hematuria or hematochezia. She is asymptomatic from the anemia. I will observe for now.  She does not require transfusion now. I will continue the chemotherapy at current dose without dosage adjustment.  If the anemia gets progressive worse in the future, I might have to delay her treatment or adjust the chemotherapy dose.

## 2023-10-27 NOTE — Assessment & Plan Note (Addendum)
 The patient was originally diagnosed with high-grade serous uterine cancer in 2017 and had surgery, chemotherapy and radiation treatment She is noted to have disease relapse in January 2025 with metastatic disease to supraclavicular lymphadenopathy, axillary lymphadenopathy and abdominal lymphadenopathy  Pathology: Recurrent high grade serous,CARIS [id number] p53 mutated, MSI stable, pMMR, BRCA neg, HEr2/Neu 2+ ER neg, Her2/neu 1+, PDL1 0%  She received cycle 1 of palliative chemotherapy early March 2025 with minimum side effects of treatment I go through the history with the patient again as she had many questions I gave her a copy of her pathology so that she can file additional insurance claim for benefits I plan to repeat imaging study after cycle 3 of treatment

## 2023-10-27 NOTE — Progress Notes (Signed)
 Hypersensitivity Reaction note  Date of event: 10/27/23 Time of event: 1525 Generic name of drug involved: carboplatin Name of provider notified of the hypersensitivity reaction: Karie Fetch, PA Was agent that likely caused hypersensitivity reaction added to Allergies List within EMR? yes Chain of events including reaction signs/symptoms, treatment administered, and outcome (e.g., drug resumed; drug discontinued; sent to Emergency Department; etc.) report received from Ivin Booty RN who took care of patient during reaction. See prior note for her documentation. After care handoff, patient continued to complain of being "ice cold" with severe shaking. Karie Fetch PA called back to bedside to evaluate. 125mg  Solumedrol ordered and given. Patients BP remained stable. About 20 minutes after administration of solumedrol, shaking markedly improved and patient resting comfortably. VS stable. Patient resting in bed until she returns to baseline.  Consepcion Hearing, RN 10/27/2023 4:41 PM

## 2023-10-27 NOTE — Assessment & Plan Note (Addendum)
 She is somewhat debilitated by treatment I completed application for disability parking permit for the patient today

## 2023-10-27 NOTE — Progress Notes (Signed)
    DATE:  10/27/23                                        X CHEMO/IMMUNOTHERAPY REACTION         MD: Bertis Ruddy   AGENT/BLOOD PRODUCT RECEIVING TODAY:              Carboplatin, jemperli, and taxol   AGENT/BLOOD PRODUCT RECEIVING IMMEDIATELY PRIOR TO REACTION:          Carboplatin    Vitals:   10/27/23 1555 10/27/23 1639  BP: 116/70   Pulse: (!) 104 96  Resp: 18 16  SpO2: 99% 98%      REACTION(S):           near syncope, tachycardia, nausea, abdominal pain, tremors   PREMEDS:     Pepcid 20 mg IV, Decadron 10 mg IV, Emend 150 mg IV, Aloxi 0.25 mg IV   INTERVENTION: Pepcid 20 mg IV, solu-medrol 125 mg IV   Review of Systems  Review of Systems  Gastrointestinal:  Positive for abdominal pain and nausea.  Neurological:  Positive for tremors.  All other systems reviewed and are negative.    Physical Exam  Physical Exam Vitals and nursing note reviewed.  Constitutional:      General: She is in acute distress.     Appearance: She is diaphoretic. She is not ill-appearing or toxic-appearing.     Comments: Tremor noted in all extremities  HENT:     Head: Normocephalic.  Eyes:     Conjunctiva/sclera: Conjunctivae normal.  Cardiovascular:     Rate and Rhythm: Regular rhythm. Tachycardia present.     Pulses: Normal pulses.     Heart sounds: Normal heart sounds.  Pulmonary:     Effort: Pulmonary effort is normal.     Breath sounds: Normal breath sounds.  Abdominal:     General: There is no distension.  Musculoskeletal:     Cervical back: Normal range of motion.  Skin:    General: Skin is warm.  Neurological:     Mental Status: She is alert.     OUTCOME:                  Patient became symptomatic near the end of carboplatin infusion. Emergency medications were administered as documented above. Patient returned to baseline. Oncologist and pharmacist notified. Will not re challenge remainder of infusion based on severity of reaction.

## 2023-10-30 ENCOUNTER — Telehealth: Payer: Self-pay | Admitting: *Deleted

## 2023-10-30 ENCOUNTER — Encounter: Payer: Self-pay | Admitting: Hematology and Oncology

## 2023-10-30 NOTE — Telephone Encounter (Signed)
-----   Message from Artis Delay sent at 10/30/2023  9:45 AM EDT ----- Regarding: RE: [place] reaction Thanks Jeri Jeanbaptiste can you call her today and see how she is doing? ----- Message ----- From: Kandice Hams Sent: 10/27/2023   9:30 PM EDT To: Margie Billet, RPH; Morrell Riddle, RN; # Subject: Ledell Noss reaction                                 Patient had severe carboplatin reaction near the end of treatment Friday afternoon (3/28). Emergency medications administered then patient was observed until she returned to baseline and able to leave with spouse.

## 2023-10-30 NOTE — Telephone Encounter (Signed)
 Called pt with message below. Left message for pt to call office and update [place] on how she is doing.

## 2023-10-30 NOTE — Telephone Encounter (Signed)
 Pt called and stated that she felt great compared to Friday after allergic reaction. No other s/e besides fatigue. Pt grateful for staffs f/u and concern

## 2023-10-31 ENCOUNTER — Other Ambulatory Visit: Payer: Self-pay | Admitting: Hematology and Oncology

## 2023-10-31 ENCOUNTER — Encounter: Payer: Self-pay | Admitting: Hematology and Oncology

## 2023-11-01 ENCOUNTER — Encounter: Payer: Self-pay | Admitting: Hematology and Oncology

## 2023-11-01 ENCOUNTER — Other Ambulatory Visit: Payer: Self-pay | Admitting: Hematology and Oncology

## 2023-11-16 ENCOUNTER — Telehealth: Payer: Self-pay

## 2023-11-16 ENCOUNTER — Encounter: Payer: Self-pay | Admitting: Hematology and Oncology

## 2023-11-16 NOTE — Telephone Encounter (Signed)
 Returned her call. She is concerned after having reaction to carboplatin 3/28. She is asking if she will get carboplatin with next treatment. Told her that she will not be getting carboplatin anymore. She verbalized understanding.

## 2023-11-21 ENCOUNTER — Inpatient Hospital Stay

## 2023-11-21 ENCOUNTER — Encounter: Payer: Self-pay | Admitting: Hematology and Oncology

## 2023-11-21 ENCOUNTER — Inpatient Hospital Stay: Attending: Hematology and Oncology | Admitting: Hematology and Oncology

## 2023-11-21 ENCOUNTER — Other Ambulatory Visit: Payer: Self-pay

## 2023-11-21 VITALS — BP 138/82 | HR 87 | Temp 97.6°F | Resp 18

## 2023-11-21 VITALS — BP 124/81 | HR 102 | Temp 97.9°F | Resp 18 | Ht 62.0 in | Wt 117.0 lb

## 2023-11-21 DIAGNOSIS — R634 Abnormal weight loss: Secondary | ICD-10-CM | POA: Insufficient documentation

## 2023-11-21 DIAGNOSIS — Z5111 Encounter for antineoplastic chemotherapy: Secondary | ICD-10-CM | POA: Insufficient documentation

## 2023-11-21 DIAGNOSIS — C541 Malignant neoplasm of endometrium: Secondary | ICD-10-CM

## 2023-11-21 DIAGNOSIS — D6481 Anemia due to antineoplastic chemotherapy: Secondary | ICD-10-CM | POA: Diagnosis not present

## 2023-11-21 DIAGNOSIS — T451X5A Adverse effect of antineoplastic and immunosuppressive drugs, initial encounter: Secondary | ICD-10-CM

## 2023-11-21 DIAGNOSIS — G629 Polyneuropathy, unspecified: Secondary | ICD-10-CM | POA: Insufficient documentation

## 2023-11-21 DIAGNOSIS — Z5112 Encounter for antineoplastic immunotherapy: Secondary | ICD-10-CM | POA: Insufficient documentation

## 2023-11-21 LAB — CMP (CANCER CENTER ONLY)
ALT: 11 U/L (ref 0–44)
AST: 15 U/L (ref 15–41)
Albumin: 3.8 g/dL (ref 3.5–5.0)
Alkaline Phosphatase: 113 U/L (ref 38–126)
Anion gap: 7 (ref 5–15)
BUN: 18 mg/dL (ref 8–23)
CO2: 25 mmol/L (ref 22–32)
Calcium: 9.4 mg/dL (ref 8.9–10.3)
Chloride: 106 mmol/L (ref 98–111)
Creatinine: 0.86 mg/dL (ref 0.44–1.00)
GFR, Estimated: >60 mL/min (ref >60)
Glucose, Bld: 200 mg/dL — ABNORMAL HIGH (ref 70–99)
Potassium: 3.6 mmol/L (ref 3.5–5.1)
Sodium: 138 mmol/L (ref 135–145)
Total Bilirubin: 0.3 mg/dL (ref 0.0–1.2)
Total Protein: 7 g/dL (ref 6.5–8.1)

## 2023-11-21 LAB — CBC WITH DIFFERENTIAL (CANCER CENTER ONLY)
Abs Immature Granulocytes: 0.02 10*3/uL (ref 0.00–0.07)
Basophils Absolute: 0 10*3/uL (ref 0.0–0.1)
Basophils Relative: 0 %
Eosinophils Absolute: 0 10*3/uL (ref 0.0–0.5)
Eosinophils Relative: 0 %
HCT: 32.2 % — ABNORMAL LOW (ref 36.0–46.0)
Hemoglobin: 11.1 g/dL — ABNORMAL LOW (ref 12.0–15.0)
Immature Granulocytes: 0 %
Lymphocytes Relative: 13 %
Lymphs Abs: 0.9 10*3/uL (ref 0.7–4.0)
MCH: 35.2 pg — ABNORMAL HIGH (ref 26.0–34.0)
MCHC: 34.5 g/dL (ref 30.0–36.0)
MCV: 102.2 fL — ABNORMAL HIGH (ref 80.0–100.0)
Monocytes Absolute: 0.4 10*3/uL (ref 0.1–1.0)
Monocytes Relative: 5 %
Neutro Abs: 6 10*3/uL (ref 1.7–7.7)
Neutrophils Relative %: 82 %
Platelet Count: 191 10*3/uL (ref 150–400)
RBC: 3.15 MIL/uL — ABNORMAL LOW (ref 3.87–5.11)
RDW: 15 % (ref 11.5–15.5)
WBC Count: 7.3 10*3/uL (ref 4.0–10.5)
nRBC: 0 % (ref 0.0–0.2)

## 2023-11-21 LAB — TSH: TSH: 0.424 u[IU]/mL (ref 0.350–4.500)

## 2023-11-21 MED ORDER — DEXAMETHASONE SODIUM PHOSPHATE 10 MG/ML IJ SOLN
10.0000 mg | Freq: Once | INTRAMUSCULAR | Status: AC
  Administered 2023-11-21: 10 mg via INTRAVENOUS
  Filled 2023-11-21: qty 1

## 2023-11-21 MED ORDER — SODIUM CHLORIDE 0.9 % IV SOLN
105.0000 mg/m2 | Freq: Once | INTRAVENOUS | Status: AC
  Administered 2023-11-21: 162 mg via INTRAVENOUS
  Filled 2023-11-21: qty 27

## 2023-11-21 MED ORDER — CETIRIZINE HCL 10 MG/ML IV SOLN
10.0000 mg | Freq: Once | INTRAVENOUS | Status: AC
Start: 2023-11-21 — End: 2023-11-21
  Administered 2023-11-21: 10 mg via INTRAVENOUS
  Filled 2023-11-21: qty 1

## 2023-11-21 MED ORDER — SODIUM CHLORIDE 0.9 % IV SOLN
500.0000 mg | Freq: Once | INTRAVENOUS | Status: AC
  Administered 2023-11-21: 500 mg via INTRAVENOUS
  Filled 2023-11-21: qty 10

## 2023-11-21 MED ORDER — SODIUM CHLORIDE 0.9% FLUSH
10.0000 mL | INTRAVENOUS | Status: DC | PRN
  Administered 2023-11-21 (×2): 10 mL

## 2023-11-21 MED ORDER — CYANOCOBALAMIN 1000 MCG/ML IJ SOLN: 1000.0000 ug | Freq: Once | INTRAMUSCULAR | Status: DC

## 2023-11-21 MED ORDER — SODIUM CHLORIDE 0.9 % IV SOLN: INTRAVENOUS | Status: DC

## 2023-11-21 MED ORDER — CYANOCOBALAMIN 1000 MCG/ML IJ SOLN
1000.0000 ug | Freq: Once | INTRAMUSCULAR | Status: AC
Start: 2023-11-21 — End: 2023-11-21
  Administered 2023-11-21: 1000 ug via INTRAMUSCULAR
  Filled 2023-11-21: qty 1

## 2023-11-21 MED ORDER — HEPARIN SOD (PORK) LOCK FLUSH 100 UNIT/ML IV SOLN
500.0000 [IU] | Freq: Once | INTRAVENOUS | Status: AC | PRN
  Administered 2023-11-21: 500 [IU]

## 2023-11-21 MED ORDER — SODIUM CHLORIDE 0.9% FLUSH
10.0000 mL | Freq: Once | INTRAVENOUS | Status: AC
  Administered 2023-11-21: 10 mL

## 2023-11-21 MED ORDER — FAMOTIDINE IN NACL 20-0.9 MG/50ML-% IV SOLN
20.0000 mg | Freq: Once | INTRAVENOUS | Status: AC
  Administered 2023-11-21: 20 mg via INTRAVENOUS
  Filled 2023-11-21: qty 50

## 2023-11-21 NOTE — Assessment & Plan Note (Addendum)
 This is likely due to recent treatment. The patient denies recent history of bleeding such as epistaxis, hematuria or hematochezia. She is asymptomatic from the anemia. I will observe for now.  She does not require transfusion now. I will continue the chemotherapy at current dose without dosage adjustment.  If the anemia gets progressive worse in the future, I might have to delay her treatment or adjust the chemotherapy dose.

## 2023-11-21 NOTE — Assessment & Plan Note (Addendum)
 We discussed importance of frequent small meals and high-protein diet

## 2023-11-21 NOTE — Patient Instructions (Signed)
 [facility] - A DEPT OF [facility]  Discharge Instructions: Thank you for choosing [facility] to provide your oncology and hematology care.   If you have a lab appointment with the Cancer Center, please go directly to the Cancer Center and check in at the registration area.   Wear comfortable clothing and clothing appropriate for easy access to any Portacath or PICC line.   We strive to give you quality time with your provider. You may need to reschedule your appointment if you arrive late (15 or more minutes).  Arriving late affects you and other patients whose appointments are after yours.  Also, if you miss three or more appointments without notifying the office, you may be dismissed from the clinic at the provider's discretion.      For prescription refill requests, have your pharmacy contact our office and allow 72 hours for refills to be completed.    Today you received the following chemotherapy and/or immunotherapy agents: Dostarlimab  - gxly (Jempreli) and Paclitaxel .    To help prevent nausea and vomiting after your treatment, we encourage you to take your nausea medication as directed.  BELOW ARE SYMPTOMS THAT SHOULD BE REPORTED IMMEDIATELY: *FEVER GREATER THAN 100.4 F (38 C) OR HIGHER *CHILLS OR SWEATING *NAUSEA AND VOMITING THAT IS NOT CONTROLLED WITH YOUR NAUSEA MEDICATION *UNUSUAL SHORTNESS OF BREATH *UNUSUAL BRUISING OR BLEEDING *URINARY PROBLEMS (pain or burning when urinating, or frequent urination) *BOWEL PROBLEMS (unusual diarrhea, constipation, pain near the anus) TENDERNESS IN MOUTH AND THROAT WITH OR WITHOUT PRESENCE OF ULCERS (sore throat, sores in mouth, or a toothache) UNUSUAL RASH, SWELLING OR PAIN  UNUSUAL VAGINAL DISCHARGE OR ITCHING   Items with * indicate a potential emergency and should be followed up as soon as possible or go to the Emergency Department if any problems should occur.  Please show the  CHEMOTHERAPY ALERT CARD or IMMUNOTHERAPY ALERT CARD at check-in to the Emergency Department and triage nurse.  Should you have questions after your visit or need to cancel or reschedule your appointment, please contact [facility] - A DEPT OF [facility]  Dept: [phone]  and follow the prompts.  Office hours are 8:00 a.m. to 4:30 p.m. Monday - Friday. Please note that voicemails left after 4:00 p.m. may not be returned until the following business day.  We are closed weekends and major holidays. You have access to a nurse at all times for urgent questions. Please call the main number to the clinic Dept: [phone] and follow the prompts.   For any non-urgent questions, you may also contact your provider using MyChart. We now offer e-Visits for anyone 72 and older to request care online for non-urgent symptoms. For details visit [email or web address].   Also download the MyChart app! Go to the app store, search "MyChart", open the app, select [place], and log in with your MyChart username and password.

## 2023-11-21 NOTE — Progress Notes (Signed)
 [facility] OFFICE PROGRESS NOTE  Patient Care Team: Almira Jaeger, MD as PCP - General (Family Medicine) Samson Croak, MD as Referring Physician (Urology)  Assessment & Plan Endometrial cancer [facility]) The patient was originally diagnosed with high-grade serous uterine cancer in 2017 and had surgery, chemotherapy and radiation treatment She is noted to have disease relapse in January 2025 with metastatic disease to supraclavicular lymphadenopathy, axillary lymphadenopathy and abdominal lymphadenopathy  Pathology: Recurrent high grade serous,CARIS [id number] p53 mutated, MSI stable, pMMR, BRCA neg, HEr2/Neu 2+ ER neg, Her2/neu 1+, PDL1 0%  She tolerated last 2 cycles of treatment well although she has mild progressive weight loss due to poor oral intake I will adjust the dose of chemotherapy based on her reduced weight We discussed importance of supportive care and frequent small meals I plan to repeat imaging study after today's treatment Anemia due to antineoplastic chemotherapy This is likely due to recent treatment. The patient denies recent history of bleeding such as epistaxis, hematuria or hematochezia. She is asymptomatic from the anemia. I will observe for now.  She does not require transfusion now. I will continue the chemotherapy at current dose without dosage adjustment.  If the anemia gets progressive worse in the future, I might have to delay her treatment or adjust the chemotherapy dose.  Weight loss, abnormal We discussed importance of frequent small meals and high-protein diet   Orders Placed This Encounter  Procedures   NM PET Image Restage (PS) Skull Base to Thigh (F-18 FDG)    Standing Status:   Future    Expected Date:   12/05/2023    Expiration Date:   11/20/2024    If indicated for the ordered procedure, I authorize the administration of a radiopharmaceutical per Radiology protocol:   Yes    Preferred imaging location?:   [facility]     Radiology Contrast Protocol - do NOT remove file path:   [email or web address]     Almeda Jacobs, MD  INTERVAL HISTORY: she returns for treatment follow-up Complications related to previous cycle of chemotherapy included anemia,, weight loss,, and peripheral neuropathy, Her neuropathy is stable She has lost weight due to skipping meals on averaging around 2 meals a day She is not eating a lot of high-protein intake  PHYSICAL EXAMINATION: ECOG PERFORMANCE STATUS: 1 - Symptomatic but completely ambulatory  Vitals:   11/21/23 0951  BP: 124/81  Pulse: (!) 102  Resp: 18  Temp: 97.9 F (36.6 C)  SpO2: 100%   Filed Weights   11/21/23 0951  Weight: 117 lb (53.1 kg)    Relevant data reviewed during this visit included CBC and CMP

## 2023-11-21 NOTE — Assessment & Plan Note (Addendum)
 The patient was originally diagnosed with high-grade serous uterine cancer in 2017 and had surgery, chemotherapy and radiation treatment She is noted to have disease relapse in January 2025 with metastatic disease to supraclavicular lymphadenopathy, axillary lymphadenopathy and abdominal lymphadenopathy  Pathology: Recurrent high grade serous,CARIS [id number] p53 mutated, MSI stable, pMMR, BRCA neg, HEr2/Neu 2+ ER neg, Her2/neu 1+, PDL1 0%  She tolerated last 2 cycles of treatment well although she has mild progressive weight loss due to poor oral intake I will adjust the dose of chemotherapy based on her reduced weight We discussed importance of supportive care and frequent small meals I plan to repeat imaging study after today's treatment

## 2023-11-22 LAB — T4: T4, Total: 8.2 ug/dL (ref 4.5–12.0)

## 2023-12-05 ENCOUNTER — Other Ambulatory Visit: Payer: Self-pay | Admitting: Hematology and Oncology

## 2023-12-05 ENCOUNTER — Ambulatory Visit (HOSPITAL_COMMUNITY)
Admission: RE | Admit: 2023-12-05 | Discharge: 2023-12-05 | Disposition: A | Discharge status: Home / Self Care | Source: Ambulatory Visit | Attending: Hematology and Oncology | Admitting: Hematology and Oncology

## 2023-12-05 DIAGNOSIS — C541 Malignant neoplasm of endometrium: Secondary | ICD-10-CM | POA: Insufficient documentation

## 2023-12-05 LAB — GLUCOSE, CAPILLARY: Glucose-Capillary: 72 mg/dL (ref 70–99)

## 2023-12-05 MED ORDER — HEPARIN SOD (PORK) LOCK FLUSH 100 UNIT/ML IV SOLN
500.0000 [IU] | Freq: Once | INTRAVENOUS | Status: AC
  Administered 2023-12-05: 500 [IU] via INTRAVENOUS
  Filled 2023-12-05: qty 5

## 2023-12-05 MED ORDER — FLUDEOXYGLUCOSE F - 18 (FDG) INJECTION
5.4000 | Freq: Once | INTRAVENOUS | Status: AC
  Administered 2023-12-05: 5.8 via INTRAVENOUS

## 2023-12-11 NOTE — Assessment & Plan Note (Addendum)
 The patient was originally diagnosed with high-grade serous uterine cancer in 2017 and had surgery, chemotherapy and radiation treatment She is noted to have disease relapse in January 2025 with metastatic disease to supraclavicular lymphadenopathy, axillary lymphadenopathy and abdominal lymphadenopathy  Pathology: Recurrent high grade serous,CARIS [id number] p53 mutated, MSI stable, pMMR, BRCA neg, HEr2/Neu 2+, ER neg, PDL1 0%  She developed carboplatin  allergy with cycle 3 of treatment PET/CT imaging were reviewed with the patient which show excellent response to therapy.  Even though on the PET/CT imaging report, there is concern for mixed response.  However, I explained to the patient, the PET/CT imaging was done in comparison with her original PET scan dated August 18, 2023.  Due to delay in obtaining tissue biopsy, her chemotherapy did not start until March 7th, 2025.  That difference could account for the changes reported on pet imaging Overall, I believe she has amazing response to treatment  The plan will be to continue until she has completed 6 cycles of chemotherapy before transitioning to single agent maintenance immunotherapy Her next imaging study will be done in July

## 2023-12-12 ENCOUNTER — Inpatient Hospital Stay

## 2023-12-12 ENCOUNTER — Inpatient Hospital Stay: Attending: Hematology and Oncology | Admitting: Hematology and Oncology

## 2023-12-12 ENCOUNTER — Encounter: Payer: Self-pay | Admitting: Hematology and Oncology

## 2023-12-12 VITALS — BP 125/83 | HR 109 | Temp 97.4°F | Resp 18 | Ht 62.0 in | Wt 115.6 lb

## 2023-12-12 VITALS — HR 89

## 2023-12-12 DIAGNOSIS — C541 Malignant neoplasm of endometrium: Secondary | ICD-10-CM

## 2023-12-12 DIAGNOSIS — G620 Drug-induced polyneuropathy: Secondary | ICD-10-CM

## 2023-12-12 DIAGNOSIS — Z923 Personal history of irradiation: Secondary | ICD-10-CM | POA: Diagnosis not present

## 2023-12-12 DIAGNOSIS — Z5112 Encounter for antineoplastic immunotherapy: Secondary | ICD-10-CM | POA: Insufficient documentation

## 2023-12-12 DIAGNOSIS — R634 Abnormal weight loss: Secondary | ICD-10-CM | POA: Diagnosis not present

## 2023-12-12 DIAGNOSIS — Z9221 Personal history of antineoplastic chemotherapy: Secondary | ICD-10-CM | POA: Insufficient documentation

## 2023-12-12 DIAGNOSIS — T451X5A Adverse effect of antineoplastic and immunosuppressive drugs, initial encounter: Secondary | ICD-10-CM | POA: Diagnosis not present

## 2023-12-12 LAB — CBC WITH DIFFERENTIAL (CANCER CENTER ONLY)
Abs Immature Granulocytes: 0.03 10*3/uL (ref 0.00–0.07)
Basophils Absolute: 0 10*3/uL (ref 0.0–0.1)
Basophils Relative: 0 %
Eosinophils Absolute: 0 10*3/uL (ref 0.0–0.5)
Eosinophils Relative: 0 %
HCT: 36.3 % (ref 36.0–46.0)
Hemoglobin: 12.4 g/dL (ref 12.0–15.0)
Immature Granulocytes: 0 %
Lymphocytes Relative: 9 %
Lymphs Abs: 0.7 10*3/uL (ref 0.7–4.0)
MCH: 35.2 pg — ABNORMAL HIGH (ref 26.0–34.0)
MCHC: 34.2 g/dL (ref 30.0–36.0)
MCV: 103.1 fL — ABNORMAL HIGH (ref 80.0–100.0)
Monocytes Absolute: 0.3 10*3/uL (ref 0.1–1.0)
Monocytes Relative: 4 %
Neutro Abs: 6.4 10*3/uL (ref 1.7–7.7)
Neutrophils Relative %: 87 %
Platelet Count: 340 10*3/uL (ref 150–400)
RBC: 3.52 MIL/uL — ABNORMAL LOW (ref 3.87–5.11)
RDW: 15.8 % — ABNORMAL HIGH (ref 11.5–15.5)
WBC Count: 7.4 10*3/uL (ref 4.0–10.5)
nRBC: 0 % (ref 0.0–0.2)

## 2023-12-12 LAB — CMP (CANCER CENTER ONLY)
ALT: 11 U/L (ref 0–44)
AST: 16 U/L (ref 15–41)
Albumin: 4.1 g/dL (ref 3.5–5.0)
Alkaline Phosphatase: 107 U/L (ref 38–126)
Anion gap: 9 (ref 5–15)
BUN: 22 mg/dL (ref 8–23)
CO2: 24 mmol/L (ref 22–32)
Calcium: 9.9 mg/dL (ref 8.9–10.3)
Chloride: 105 mmol/L (ref 98–111)
Creatinine: 0.94 mg/dL (ref 0.44–1.00)
GFR, Estimated: >60 mL/min (ref >60)
Glucose, Bld: 197 mg/dL — ABNORMAL HIGH (ref 70–99)
Potassium: 3.8 mmol/L (ref 3.5–5.1)
Sodium: 138 mmol/L (ref 135–145)
Total Bilirubin: 0.3 mg/dL (ref 0.0–1.2)
Total Protein: 7.3 g/dL (ref 6.5–8.1)

## 2023-12-12 MED ORDER — DEXAMETHASONE SODIUM PHOSPHATE 10 MG/ML IJ SOLN
10.0000 mg | Freq: Once | INTRAMUSCULAR | Status: AC
  Administered 2023-12-12: 10 mg via INTRAVENOUS
  Filled 2023-12-12: qty 1

## 2023-12-12 MED ORDER — FAMOTIDINE IN NACL 20-0.9 MG/50ML-% IV SOLN
20.0000 mg | Freq: Once | INTRAVENOUS | Status: AC
  Administered 2023-12-12: 20 mg via INTRAVENOUS
  Filled 2023-12-12: qty 50

## 2023-12-12 MED ORDER — SODIUM CHLORIDE 0.9 % IV SOLN
500.0000 mg | Freq: Once | INTRAVENOUS | Status: AC
  Administered 2023-12-12: 500 mg via INTRAVENOUS
  Filled 2023-12-12: qty 10

## 2023-12-12 MED ORDER — SODIUM CHLORIDE 0.9 % IV SOLN: INTRAVENOUS | Status: DC

## 2023-12-12 MED ORDER — CETIRIZINE HCL 10 MG/ML IV SOLN
10.0000 mg | Freq: Once | INTRAVENOUS | Status: AC
  Administered 2023-12-12: 10 mg via INTRAVENOUS
  Filled 2023-12-12: qty 1

## 2023-12-12 MED ORDER — HEPARIN SOD (PORK) LOCK FLUSH 100 UNIT/ML IV SOLN
500.0000 [IU] | Freq: Once | INTRAVENOUS | Status: AC | PRN
Start: 2023-12-12 — End: 2023-12-12
  Administered 2023-12-12: 500 [IU]

## 2023-12-12 MED ORDER — PACLITAXEL CHEMO INJECTION 300 MG/50ML
105.0000 mg/m2 | Freq: Once | INTRAVENOUS | Status: AC
  Administered 2023-12-12: 162 mg via INTRAVENOUS
  Filled 2023-12-12: qty 27

## 2023-12-12 MED ORDER — SODIUM CHLORIDE 0.9% FLUSH
10.0000 mL | Freq: Once | INTRAVENOUS | Status: AC
  Administered 2023-12-12: 10 mL

## 2023-12-12 MED ORDER — CYANOCOBALAMIN 1000 MCG/ML IJ SOLN
1000.0000 ug | Freq: Once | INTRAMUSCULAR | Status: AC
  Administered 2023-12-12: 1000 ug via INTRAMUSCULAR
  Filled 2023-12-12: qty 1

## 2023-12-12 MED ORDER — ALTEPLASE 2 MG IJ SOLR
2.0000 mg | Freq: Once | INTRAMUSCULAR | Status: AC
  Administered 2023-12-12: 2 mg
  Filled 2023-12-12: qty 2

## 2023-12-12 MED ORDER — SODIUM CHLORIDE 0.9% FLUSH
10.0000 mL | INTRAVENOUS | Status: DC | PRN
  Administered 2023-12-12: 10 mL

## 2023-12-12 NOTE — Assessment & Plan Note (Addendum)
 She denies worsening neuropathy Observe closely We also discussed the role of cryotherapy

## 2023-12-12 NOTE — Assessment & Plan Note (Addendum)
 We discussed importance of frequent small meals and high-protein diet

## 2023-12-12 NOTE — Patient Instructions (Signed)
 [facility] - A DEPT OF [place]  Discharge Instructions: Thank you for choosing [facility] to provide your oncology and hematology care.   If you have a lab appointment with the Cancer Center, please go directly to the Cancer Center and check in at the registration area.   Wear comfortable clothing and clothing appropriate for easy access to any Portacath or PICC line.   We strive to give you quality time with your provider. You may need to reschedule your appointment if you arrive late (15 or more minutes).  Arriving late affects you and other patients whose appointments are after yours.  Also, if you miss three or more appointments without notifying the office, you may be dismissed from the clinic at the provider's discretion.      For prescription refill requests, have your pharmacy contact our office and allow 72 hours for refills to be completed.    Today you received the following chemotherapy and/or immunotherapy agents: PACLitaxel  (TAXOL ) and dostarlimab -gxly (JEMPERLI )      To help prevent nausea and vomiting after your treatment, we encourage you to take your nausea medication as directed.  BELOW ARE SYMPTOMS THAT SHOULD BE REPORTED IMMEDIATELY: *FEVER GREATER THAN 100.4 F (38 C) OR HIGHER *CHILLS OR SWEATING *NAUSEA AND VOMITING THAT IS NOT CONTROLLED WITH YOUR NAUSEA MEDICATION *UNUSUAL SHORTNESS OF BREATH *UNUSUAL BRUISING OR BLEEDING *URINARY PROBLEMS (pain or burning when urinating, or frequent urination) *BOWEL PROBLEMS (unusual diarrhea, constipation, pain near the anus) TENDERNESS IN MOUTH AND THROAT WITH OR WITHOUT PRESENCE OF ULCERS (sore throat, sores in mouth, or a toothache) UNUSUAL RASH, SWELLING OR PAIN  UNUSUAL VAGINAL DISCHARGE OR ITCHING   Items with * indicate a potential emergency and should be followed up as soon as possible or go to the Emergency Department if any problems should occur.  Please show the  CHEMOTHERAPY ALERT CARD or IMMUNOTHERAPY ALERT CARD at check-in to the Emergency Department and triage nurse.  Should you have questions after your visit or need to cancel or reschedule your appointment, please contact [facility] - A DEPT OF [facility]  Dept: [phone]  and follow the prompts.  Office hours are 8:00 a.m. to 4:30 p.m. Monday - Friday. Please note that voicemails left after 4:00 p.m. may not be returned until the following business day.  We are closed weekends and major holidays. You have access to a nurse at all times for urgent questions. Please call the main number to the clinic Dept: [phone] and follow the prompts.   For any non-urgent questions, you may also contact your provider using MyChart. We now offer e-Visits for anyone 79 and older to request care online for non-urgent symptoms. For details visit [email or web address].   Also download the MyChart app! Go to the app store, search "MyChart", open the app, select , and log in with your MyChart username and password.

## 2023-12-12 NOTE — Progress Notes (Signed)
 [facility] OFFICE PROGRESS NOTE  Patient Care Team: Almira Jaeger, MD as PCP - General (Family Medicine) Samson Croak, MD as Referring Physician (Urology)  Assessment & Plan Endometrial cancer [facility]) The patient was originally diagnosed with high-grade serous uterine cancer in 2017 and had surgery, chemotherapy and radiation treatment She is noted to have disease relapse in January 2025 with metastatic disease to supraclavicular lymphadenopathy, axillary lymphadenopathy and abdominal lymphadenopathy  Pathology: Recurrent high grade serous,CARIS [id number] p53 mutated, MSI stable, pMMR, BRCA neg, HEr2/Neu 2+, ER neg, PDL1 0%  She developed carboplatin  allergy with cycle 3 of treatment PET/CT imaging were reviewed with the patient which show excellent response to therapy.  Even though on the PET/CT imaging report, there is concern for mixed response.  However, I explained to the patient, the PET/CT imaging was done in comparison with her original PET scan dated August 18, 2023.  Due to delay in obtaining tissue biopsy, her chemotherapy did not start until March 7th, 2025.  That difference could account for the changes reported on pet imaging Overall, I believe she has amazing response to treatment  The plan will be to continue until she has completed 6 cycles of chemotherapy before transitioning to single agent maintenance immunotherapy Her next imaging study will be done in July Peripheral neuropathy due to chemotherapy [facility]) She denies worsening neuropathy Observe closely We also discussed the role of cryotherapy Weight loss, abnormal We discussed importance of frequent small meals and high-protein diet   No orders of the defined types were placed in this encounter.    Almeda Jacobs, MD  INTERVAL HISTORY: she returns for treatment follow-up Complications related to previous cycle of chemotherapy included weight loss, and peripheral neuropathy, Her port was  not working today and hence a delay She denies side effects from recent treatment I reviewed multiple imaging studies with the patient and her husband  PHYSICAL EXAMINATION: ECOG PERFORMANCE STATUS: 1 - Symptomatic but completely ambulatory  Vitals:   12/12/23 1005  BP: 125/83  Pulse: (!) 109  Resp: 18  Temp: (!) 97.4 F (36.3 C)  SpO2: 98%   Filed Weights   12/12/23 1005  Weight: 115 lb 9.6 oz (52.4 kg)    Relevant data reviewed during this visit included CBC, CMP, PET/CT imaging from May 2025 and January 2025

## 2023-12-13 ENCOUNTER — Other Ambulatory Visit: Payer: Self-pay

## 2023-12-14 ENCOUNTER — Other Ambulatory Visit: Payer: Self-pay

## 2023-12-14 ENCOUNTER — Encounter: Payer: PPO | Admitting: Family Medicine

## 2023-12-31 ENCOUNTER — Other Ambulatory Visit: Payer: Self-pay

## 2024-01-02 ENCOUNTER — Inpatient Hospital Stay: Admitting: Hematology and Oncology

## 2024-01-02 ENCOUNTER — Encounter: Payer: Self-pay | Admitting: Hematology and Oncology

## 2024-01-02 ENCOUNTER — Inpatient Hospital Stay: Attending: Hematology and Oncology

## 2024-01-02 VITALS — BP 118/76 | HR 102 | Temp 98.1°F | Resp 18 | Ht 62.0 in | Wt 114.0 lb

## 2024-01-02 DIAGNOSIS — R634 Abnormal weight loss: Secondary | ICD-10-CM

## 2024-01-02 DIAGNOSIS — T451X5A Adverse effect of antineoplastic and immunosuppressive drugs, initial encounter: Secondary | ICD-10-CM | POA: Diagnosis not present

## 2024-01-02 DIAGNOSIS — C541 Malignant neoplasm of endometrium: Secondary | ICD-10-CM | POA: Insufficient documentation

## 2024-01-02 DIAGNOSIS — Z5112 Encounter for antineoplastic immunotherapy: Secondary | ICD-10-CM | POA: Diagnosis not present

## 2024-01-02 DIAGNOSIS — F17210 Nicotine dependence, cigarettes, uncomplicated: Secondary | ICD-10-CM | POA: Insufficient documentation

## 2024-01-02 DIAGNOSIS — Z79899 Other long term (current) drug therapy: Secondary | ICD-10-CM | POA: Diagnosis not present

## 2024-01-02 DIAGNOSIS — G620 Drug-induced polyneuropathy: Secondary | ICD-10-CM

## 2024-01-02 DIAGNOSIS — D6481 Anemia due to antineoplastic chemotherapy: Secondary | ICD-10-CM | POA: Insufficient documentation

## 2024-01-02 DIAGNOSIS — K5900 Constipation, unspecified: Secondary | ICD-10-CM | POA: Diagnosis not present

## 2024-01-02 DIAGNOSIS — K5909 Other constipation: Secondary | ICD-10-CM | POA: Diagnosis not present

## 2024-01-02 DIAGNOSIS — R590 Localized enlarged lymph nodes: Secondary | ICD-10-CM | POA: Diagnosis not present

## 2024-01-02 LAB — CMP (CANCER CENTER ONLY)
ALT: 13 U/L (ref 0–44)
AST: 18 U/L (ref 15–41)
Albumin: 3.8 g/dL (ref 3.5–5.0)
Alkaline Phosphatase: 100 U/L (ref 38–126)
Anion gap: 7 (ref 5–15)
BUN: 22 mg/dL (ref 8–23)
CO2: 27 mmol/L (ref 22–32)
Calcium: 9.5 mg/dL (ref 8.9–10.3)
Chloride: 105 mmol/L (ref 98–111)
Creatinine: 0.84 mg/dL (ref 0.44–1.00)
GFR, Estimated: >60 mL/min (ref >60)
Glucose, Bld: 100 mg/dL — ABNORMAL HIGH (ref 70–99)
Potassium: 3.7 mmol/L (ref 3.5–5.1)
Sodium: 139 mmol/L (ref 135–145)
Total Bilirubin: 0.3 mg/dL (ref 0.0–1.2)
Total Protein: 6.9 g/dL (ref 6.5–8.1)

## 2024-01-02 LAB — CBC WITH DIFFERENTIAL (CANCER CENTER ONLY)
Abs Immature Granulocytes: 0.02 10*3/uL (ref 0.00–0.07)
Basophils Absolute: 0.1 10*3/uL (ref 0.0–0.1)
Basophils Relative: 1 %
Eosinophils Absolute: 0.2 10*3/uL (ref 0.0–0.5)
Eosinophils Relative: 3 %
HCT: 35.7 % — ABNORMAL LOW (ref 36.0–46.0)
Hemoglobin: 11.9 g/dL — ABNORMAL LOW (ref 12.0–15.0)
Immature Granulocytes: 0 %
Lymphocytes Relative: 18 %
Lymphs Abs: 1.7 10*3/uL (ref 0.7–4.0)
MCH: 34.3 pg — ABNORMAL HIGH (ref 26.0–34.0)
MCHC: 33.3 g/dL (ref 30.0–36.0)
MCV: 102.9 fL — ABNORMAL HIGH (ref 80.0–100.0)
Monocytes Absolute: 0.8 10*3/uL (ref 0.1–1.0)
Monocytes Relative: 8 %
Neutro Abs: 6.6 10*3/uL (ref 1.7–7.7)
Neutrophils Relative %: 70 %
Platelet Count: 308 10*3/uL (ref 150–400)
RBC: 3.47 MIL/uL — ABNORMAL LOW (ref 3.87–5.11)
RDW: 15 % (ref 11.5–15.5)
WBC Count: 9.4 10*3/uL (ref 4.0–10.5)
nRBC: 0 % (ref 0.0–0.2)

## 2024-01-02 MED ORDER — SODIUM CHLORIDE 0.9% FLUSH
10.0000 mL | Freq: Once | INTRAVENOUS | Status: AC
  Administered 2024-01-02: 10 mL

## 2024-01-02 MED ORDER — CYANOCOBALAMIN 1000 MCG/ML IJ SOLN
1000.0000 ug | Freq: Once | INTRAMUSCULAR | Status: AC
  Administered 2024-01-02: 1000 ug via INTRAMUSCULAR
  Filled 2024-01-02: qty 1

## 2024-01-02 MED ORDER — HEPARIN SOD (PORK) LOCK FLUSH 100 UNIT/ML IV SOLN
500.0000 [IU] | Freq: Once | INTRAVENOUS | Status: AC
  Administered 2024-01-02: 500 [IU]

## 2024-01-02 MED ORDER — MIRTAZAPINE 15 MG PO TABS: 15.0000 mg | ORAL_TABLET | Freq: Every day | ORAL | 1 refills | Status: DC

## 2024-01-02 NOTE — Assessment & Plan Note (Addendum)
 She denies worsening neuropathy Observe closely Continue cryotherapy

## 2024-01-02 NOTE — Assessment & Plan Note (Addendum)
 This is likely due to recent treatment. The patient denies recent history of bleeding such as epistaxis, hematuria or hematochezia. She is asymptomatic from the anemia. I will observe for now.  She does not require transfusion now. I will continue the chemotherapy at current dose without dosage adjustment.  If the anemia gets progressive worse in the future, I might have to delay her treatment or adjust the chemotherapy dose.

## 2024-01-02 NOTE — Assessment & Plan Note (Addendum)
 The patient was originally diagnosed with high-grade serous uterine cancer in 2017 and had surgery, chemotherapy and radiation treatment She is noted to have disease relapse in January 2025 with metastatic disease to supraclavicular lymphadenopathy, axillary lymphadenopathy and abdominal lymphadenopathy  Pathology: Recurrent high grade serous,CARIS [id number] p53 mutated, MSI stable, pMMR, BRCA neg, HEr2/Neu 2+, ER neg, PDL1 0%  She developed carboplatin  allergy with cycle 3 of treatment PET/CT imaging were reviewed with the patient which show excellent response to therapy.  Even though on the PET/CT imaging report, there is concern for mixed response.  However, I explained to the patient, the PET/CT imaging was done in comparison with her original PET scan dated August 18, 2023.  Due to delay in obtaining tissue biopsy, her chemotherapy did not start until March 7th, 2025.  That difference could account for the changes reported on pet imaging Overall, I believe she has amazing response to treatment  The plan will be to continue until she has completed 6-7 cycles of chemotherapy before transitioning to single agent maintenance immunotherapy Her next imaging study will be done in July

## 2024-01-02 NOTE — Assessment & Plan Note (Addendum)
 We discussed importance of frequent small meals and high-protein diet

## 2024-01-02 NOTE — Assessment & Plan Note (Addendum)
 She will continue rest of laxative therapy with each cycle of treatment

## 2024-01-02 NOTE — Progress Notes (Signed)
 [facility] OFFICE PROGRESS NOTE  Patient Care Team: Almira Jaeger, MD as PCP - General (Family Medicine) Samson Croak, MD as Referring Physician (Urology)  Assessment & Plan Endometrial cancer [facility]) The patient was originally diagnosed with high-grade serous uterine cancer in 2017 and had surgery, chemotherapy and radiation treatment She is noted to have disease relapse in January 2025 with metastatic disease to supraclavicular lymphadenopathy, axillary lymphadenopathy and abdominal lymphadenopathy  Pathology: Recurrent high grade serous,CARIS [id number] p53 mutated, MSI stable, pMMR, BRCA neg, HEr2/Neu 2+, ER neg, PDL1 0%  She developed carboplatin  allergy with cycle 3 of treatment PET/CT imaging were reviewed with the patient which show excellent response to therapy.  Even though on the PET/CT imaging report, there is concern for mixed response.  However, I explained to the patient, the PET/CT imaging was done in comparison with her original PET scan dated August 18, 2023.  Due to delay in obtaining tissue biopsy, her chemotherapy did not start until March 7th, 2025.  That difference could account for the changes reported on pet imaging Overall, I believe she has amazing response to treatment  The plan will be to continue until she has completed 6-7 cycles of chemotherapy before transitioning to single agent maintenance immunotherapy Her next imaging study will be done in July Anemia due to antineoplastic chemotherapy This is likely due to recent treatment. The patient denies recent history of bleeding such as epistaxis, hematuria or hematochezia. She is asymptomatic from the anemia. I will observe for now.  She does not require transfusion now. I will continue the chemotherapy at current dose without dosage adjustment.  If the anemia gets progressive worse in the future, I might have to delay her treatment or adjust the chemotherapy dose.  Weight loss, abnormal We  discussed importance of frequent small meals and high-protein diet  Peripheral neuropathy due to chemotherapy [facility]) She denies worsening neuropathy Observe closely Continue cryotherapy Other constipation She will continue rest of laxative therapy with each cycle of treatment  Orders Placed This Encounter  Procedures   CBC with Differential (Cancer Center Only)    Standing Status:   Future    Expected Date:   02/13/2024    Expiration Date:   02/12/2025   CMP (Cancer Center only)    Standing Status:   Future    Expected Date:   02/13/2024    Expiration Date:   02/12/2025   T4    Standing Status:   Future    Expected Date:   02/13/2024    Expiration Date:   02/12/2025   TSH    Standing Status:   Future    Expected Date:   02/13/2024    Expiration Date:   02/12/2025     Almeda Jacobs, MD  INTERVAL HISTORY: she returns for treatment follow-up Complications related to previous cycle of chemotherapy included anemia,, constipation,, weight loss,, and peripheral neuropathy, She denies worsening neuropathy Constipation is well-controlled with laxative She complained of occasional skin itching  PHYSICAL EXAMINATION: ECOG PERFORMANCE STATUS: 1 - Symptomatic but completely ambulatory  No results found for: "CAN125"    Latest Ref Rng & Units 01/02/2024   12:34 PM 12/12/2023    9:59 AM 11/21/2023    9:13 AM  CBC  WBC 4.0 - 10.5 K/uL 9.4  7.4  7.3   Hemoglobin 12.0 - 15.0 g/dL 62.1  30.8  65.7   Hematocrit 36.0 - 46.0 % 35.7  36.3  32.2   Platelets 150 - 400  K/uL 308  340  191       Chemistry      Component Value Date/Time   NA 139 01/02/2024 1234   NA 142 06/30/2016 1348   K 3.7 01/02/2024 1234   K 3.4 (L) 06/30/2016 1348   CL 105 01/02/2024 1234   CO2 27 01/02/2024 1234   CO2 28 06/30/2016 1348   BUN 22 01/02/2024 1234   BUN 8.7 06/30/2016 1348   CREATININE 0.84 01/02/2024 1234   CREATININE 1.32 (H) 05/07/2020 1519   CREATININE 0.8 06/30/2016 1348      Component Value  Date/Time   CALCIUM  9.5 01/02/2024 1234   CALCIUM  10.0 06/30/2016 1348   ALKPHOS 100 01/02/2024 1234   ALKPHOS 88 06/30/2016 1348   AST 18 01/02/2024 1234   AST 17 06/30/2016 1348   ALT 13 01/02/2024 1234   ALT 12 06/30/2016 1348   BILITOT 0.3 01/02/2024 1234   BILITOT 0.31 06/30/2016 1348       Vitals:   01/02/24 1309  BP: 118/76  Pulse: (!) 102  Resp: 18  Temp: 98.1 F (36.7 C)  SpO2: 100%   Filed Weights   01/02/24 1309  Weight: 114 lb (51.7 kg)   Other relevant data reviewed during this visit included CBC and CMP

## 2024-01-03 ENCOUNTER — Telehealth: Payer: Self-pay

## 2024-01-03 ENCOUNTER — Inpatient Hospital Stay

## 2024-01-03 VITALS — BP 124/91 | HR 100 | Temp 97.6°F | Resp 16

## 2024-01-03 DIAGNOSIS — Z5112 Encounter for antineoplastic immunotherapy: Secondary | ICD-10-CM | POA: Diagnosis not present

## 2024-01-03 DIAGNOSIS — C541 Malignant neoplasm of endometrium: Secondary | ICD-10-CM

## 2024-01-03 MED ORDER — FAMOTIDINE IN NACL 20-0.9 MG/50ML-% IV SOLN
20.0000 mg | Freq: Once | INTRAVENOUS | Status: AC
  Administered 2024-01-03: 20 mg via INTRAVENOUS
  Filled 2024-01-03: qty 50

## 2024-01-03 MED ORDER — SODIUM CHLORIDE 0.9% FLUSH
10.0000 mL | INTRAVENOUS | Status: DC | PRN
  Administered 2024-01-03: 10 mL

## 2024-01-03 MED ORDER — CETIRIZINE HCL 10 MG/ML IV SOLN
10.0000 mg | Freq: Once | INTRAVENOUS | Status: AC
  Administered 2024-01-03: 10 mg via INTRAVENOUS
  Filled 2024-01-03: qty 1

## 2024-01-03 MED ORDER — SODIUM CHLORIDE 0.9 % IV SOLN
105.0000 mg/m2 | Freq: Once | INTRAVENOUS | Status: AC
  Administered 2024-01-03: 162 mg via INTRAVENOUS
  Filled 2024-01-03: qty 27

## 2024-01-03 MED ORDER — SODIUM CHLORIDE 0.9 % IV SOLN: INTRAVENOUS | Status: DC

## 2024-01-03 MED ORDER — HEPARIN SOD (PORK) LOCK FLUSH 100 UNIT/ML IV SOLN
500.0000 [IU] | Freq: Once | INTRAVENOUS | Status: AC | PRN
  Administered 2024-01-03: 500 [IU]

## 2024-01-03 MED ORDER — DEXAMETHASONE SODIUM PHOSPHATE 10 MG/ML IJ SOLN
10.0000 mg | Freq: Once | INTRAMUSCULAR | Status: AC
  Administered 2024-01-03: 10 mg via INTRAVENOUS
  Filled 2024-01-03: qty 1

## 2024-01-03 MED ORDER — SODIUM CHLORIDE 0.9 % IV SOLN
500.0000 mg | Freq: Once | INTRAVENOUS | Status: AC
  Administered 2024-01-03: 500 mg via INTRAVENOUS
  Filled 2024-01-03: qty 10

## 2024-01-03 NOTE — Telephone Encounter (Signed)
 Called patient and offered her to either push out CPE or be accommodated during visit on 6/19. Patient is agreement to  call to check in and wait outside for appointment. At the end of the appointment, she is okay with being let out of the side door instead of the main entrance.

## 2024-01-03 NOTE — Telephone Encounter (Signed)
 If she is okay with moving it out I think we could move it out until next available just to be on the safe side

## 2024-01-03 NOTE — Telephone Encounter (Signed)
 See below.  Copied from CRM [id number]. Topic: General - Other >> Jan 03, 2024  9:40 AM Jacqueline Hammond wrote: Reason for CRM: Patient is scheduled with provider for physical on June 19,2025 with Dr Arlene Ben. She is currently undergoing chemotherapy and would like to know if it would be safe for her to come into the office for her appointment since she's advised to avoid large crowds.Please advise.

## 2024-01-03 NOTE — Telephone Encounter (Signed)
 See below regarding rescheduling.

## 2024-01-03 NOTE — Patient Instructions (Signed)
 [facility] - A DEPT OF [facility]  Discharge Instructions: Thank you for choosing [facility] to provide your oncology and hematology care.   If you have a lab appointment with the Cancer Center, please go directly to the Cancer Center and check in at the registration area.   Wear comfortable clothing and clothing appropriate for easy access to any Portacath or PICC line.   We strive to give you quality time with your provider. You may need to reschedule your appointment if you arrive late (15 or more minutes).  Arriving late affects you and other patients whose appointments are after yours.  Also, if you miss three or more appointments without notifying the office, you may be dismissed from the clinic at the provider's discretion.      For prescription refill requests, have your pharmacy contact our office and allow 72 hours for refills to be completed.    Today you received the following chemotherapy and/or immunotherapy agents: dostarlimab -gxly and paclitaxel       To help prevent nausea and vomiting after your treatment, we encourage you to take your nausea medication as directed.  BELOW ARE SYMPTOMS THAT SHOULD BE REPORTED IMMEDIATELY: *FEVER GREATER THAN 100.4 F (38 C) OR HIGHER *CHILLS OR SWEATING *NAUSEA AND VOMITING THAT IS NOT CONTROLLED WITH YOUR NAUSEA MEDICATION *UNUSUAL SHORTNESS OF BREATH *UNUSUAL BRUISING OR BLEEDING *URINARY PROBLEMS (pain or burning when urinating, or frequent urination) *BOWEL PROBLEMS (unusual diarrhea, constipation, pain near the anus) TENDERNESS IN MOUTH AND THROAT WITH OR WITHOUT PRESENCE OF ULCERS (sore throat, sores in mouth, or a toothache) UNUSUAL RASH, SWELLING OR PAIN  UNUSUAL VAGINAL DISCHARGE OR ITCHING   Items with * indicate a potential emergency and should be followed up as soon as possible or go to the Emergency Department if any problems should occur.  Please show the CHEMOTHERAPY ALERT  CARD or IMMUNOTHERAPY ALERT CARD at check-in to the Emergency Department and triage nurse.  Should you have questions after your visit or need to cancel or reschedule your appointment, please contact [facility] - A DEPT OF [facility]  Dept: [phone]  and follow the prompts.  Office hours are 8:00 a.m. to 4:30 p.m. Monday - Friday. Please note that voicemails left after 4:00 p.m. may not be returned until the following business day.  We are closed weekends and major holidays. You have access to a nurse at all times for urgent questions. Please call the main number to the clinic Dept: [phone] and follow the prompts.   For any non-urgent questions, you may also contact your provider using MyChart. We now offer e-Visits for anyone 46 and older to request care online for non-urgent symptoms. For details visit [email or web address].   Also download the MyChart app! Go to the app store, search "MyChart", open the app, select [facility], and log in with your MyChart username and password.

## 2024-01-04 NOTE — Telephone Encounter (Signed)
 Noted

## 2024-01-11 ENCOUNTER — Telehealth: Payer: Self-pay

## 2024-01-11 DIAGNOSIS — E785 Hyperlipidemia, unspecified: Secondary | ICD-10-CM

## 2024-01-11 DIAGNOSIS — E538 Deficiency of other specified B group vitamins: Secondary | ICD-10-CM

## 2024-01-11 NOTE — Telephone Encounter (Signed)
.    She has had CBC, CMP, TSH, T4 within the last 3 months-I do not think she needs to repeat those unless she wants to  I would order lipid panel under hyperlipidemia and B12 under B12 deficiency unless she wants additional labs-I would be open to those as well but would need to see them

## 2024-01-11 NOTE — Telephone Encounter (Signed)
 Ok to order labs?  Copied from CRM [id number]. Topic: Clinical - Request for Lab/Test Order >> Jan 11, 2024 10:11 AM Jacqueline Hammond H wrote: Reason for CRM: Patient wants to come in prior to physical on 6/19 and have physical labs done since appointment is at 1:00 pm needs orders in system.  Jacqueline Hammond [phone]

## 2024-01-12 NOTE — Telephone Encounter (Signed)
Future labs ordered, ok to schedule lab visit.

## 2024-01-12 NOTE — Addendum Note (Signed)
 Addended by: Arva Lathe on: 01/12/2024 11:28 AM   Modules accepted: Orders

## 2024-01-15 ENCOUNTER — Other Ambulatory Visit (INDEPENDENT_AMBULATORY_CARE_PROVIDER_SITE_OTHER)

## 2024-01-15 ENCOUNTER — Ambulatory Visit: Payer: Self-pay | Admitting: Family Medicine

## 2024-01-15 DIAGNOSIS — E538 Deficiency of other specified B group vitamins: Secondary | ICD-10-CM | POA: Diagnosis not present

## 2024-01-15 DIAGNOSIS — E785 Hyperlipidemia, unspecified: Secondary | ICD-10-CM

## 2024-01-15 LAB — LIPID PANEL
Cholesterol: 233 mg/dL — ABNORMAL HIGH (ref 0–200)
HDL: 35.3 mg/dL — ABNORMAL LOW (ref >39.00)
LDL Cholesterol: 171 mg/dL — ABNORMAL HIGH (ref 0–99)
NonHDL: 197.83
Total CHOL/HDL Ratio: 7
Triglycerides: 136 mg/dL (ref 0.0–149.0)
VLDL: 27.2 mg/dL (ref 0.0–40.0)

## 2024-01-15 LAB — VITAMIN B12: Vitamin B-12: 510 pg/mL (ref 211–911)

## 2024-01-18 ENCOUNTER — Encounter: Payer: Self-pay | Admitting: Family Medicine

## 2024-01-18 ENCOUNTER — Ambulatory Visit (INDEPENDENT_AMBULATORY_CARE_PROVIDER_SITE_OTHER): Admitting: Family Medicine

## 2024-01-18 VITALS — BP 104/70 | HR 93 | Temp 97.5°F | Resp 16 | Ht 62.0 in | Wt 114.2 lb

## 2024-01-18 DIAGNOSIS — F17200 Nicotine dependence, unspecified, uncomplicated: Secondary | ICD-10-CM

## 2024-01-18 DIAGNOSIS — J439 Emphysema, unspecified: Secondary | ICD-10-CM

## 2024-01-18 DIAGNOSIS — Z0000 Encounter for general adult medical examination without abnormal findings: Secondary | ICD-10-CM | POA: Diagnosis not present
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
## 2024-01-18 NOTE — Patient Instructions (Addendum)
 No changes today - leave atorvastatin  alone during treatment and on mirtazapine - reach out if you come off of this and we can recheck 6 weeks after  Recommended follow up: Return in about 1 year (around 01/17/2025) for physical or sooner if needed.Schedule b4 you leave.

## 2024-01-23 ENCOUNTER — Encounter: Payer: Self-pay | Admitting: Hematology and Oncology

## 2024-01-23 ENCOUNTER — Inpatient Hospital Stay

## 2024-01-23 ENCOUNTER — Inpatient Hospital Stay: Admitting: Hematology and Oncology

## 2024-01-23 VITALS — BP 125/82 | HR 95 | Temp 98.9°F | Resp 18 | Ht 62.0 in | Wt 115.0 lb

## 2024-01-23 DIAGNOSIS — C541 Malignant neoplasm of endometrium: Secondary | ICD-10-CM

## 2024-01-23 DIAGNOSIS — T451X5A Adverse effect of antineoplastic and immunosuppressive drugs, initial encounter: Secondary | ICD-10-CM | POA: Diagnosis not present

## 2024-01-23 DIAGNOSIS — G620 Drug-induced polyneuropathy: Secondary | ICD-10-CM

## 2024-01-23 DIAGNOSIS — Z720 Tobacco use: Secondary | ICD-10-CM | POA: Diagnosis not present

## 2024-01-23 DIAGNOSIS — Z5112 Encounter for antineoplastic immunotherapy: Secondary | ICD-10-CM | POA: Diagnosis not present

## 2024-01-23 LAB — CBC WITH DIFFERENTIAL (CANCER CENTER ONLY)
Abs Immature Granulocytes: 0.03 10*3/uL (ref 0.00–0.07)
Basophils Absolute: 0 10*3/uL (ref 0.0–0.1)
Basophils Relative: 0 %
Eosinophils Absolute: 0 10*3/uL (ref 0.0–0.5)
Eosinophils Relative: 0 %
HCT: 37 % (ref 36.0–46.0)
Hemoglobin: 12.4 g/dL (ref 12.0–15.0)
Immature Granulocytes: 1 %
Lymphocytes Relative: 10 %
Lymphs Abs: 0.6 10*3/uL — ABNORMAL LOW (ref 0.7–4.0)
MCH: 34 pg (ref 26.0–34.0)
MCHC: 33.5 g/dL (ref 30.0–36.0)
MCV: 101.4 fL — ABNORMAL HIGH (ref 80.0–100.0)
Monocytes Absolute: 0.2 10*3/uL (ref 0.1–1.0)
Monocytes Relative: 4 %
Neutro Abs: 5.7 10*3/uL (ref 1.7–7.7)
Neutrophils Relative %: 85 %
Platelet Count: 339 10*3/uL (ref 150–400)
RBC: 3.65 MIL/uL — ABNORMAL LOW (ref 3.87–5.11)
RDW: 14.5 % (ref 11.5–15.5)
WBC Count: 6.6 10*3/uL (ref 4.0–10.5)
nRBC: 0 % (ref 0.0–0.2)

## 2024-01-23 LAB — TSH: TSH: 0.412 u[IU]/mL (ref 0.350–4.500)

## 2024-01-23 LAB — CMP (CANCER CENTER ONLY)
ALT: 12 U/L (ref 0–44)
AST: 16 U/L (ref 15–41)
Albumin: 4.1 g/dL (ref 3.5–5.0)
Alkaline Phosphatase: 100 U/L (ref 38–126)
Anion gap: 8 (ref 5–15)
BUN: 17 mg/dL (ref 8–23)
CO2: 25 mmol/L (ref 22–32)
Calcium: 9.9 mg/dL (ref 8.9–10.3)
Chloride: 104 mmol/L (ref 98–111)
Creatinine: 0.87 mg/dL (ref 0.44–1.00)
GFR, Estimated: >60 mL/min (ref >60)
Glucose, Bld: 228 mg/dL — ABNORMAL HIGH (ref 70–99)
Potassium: 3.8 mmol/L (ref 3.5–5.1)
Sodium: 137 mmol/L (ref 135–145)
Total Bilirubin: 0.3 mg/dL (ref 0.0–1.2)
Total Protein: 7.2 g/dL (ref 6.5–8.1)

## 2024-01-23 MED ORDER — CETIRIZINE HCL 10 MG/ML IV SOLN
10.0000 mg | Freq: Once | INTRAVENOUS | Status: AC
  Administered 2024-01-23: 10 mg via INTRAVENOUS
  Filled 2024-01-23: qty 1

## 2024-01-23 MED ORDER — HEPARIN SOD (PORK) LOCK FLUSH 100 UNIT/ML IV SOLN
500.0000 [IU] | Freq: Once | INTRAVENOUS | Status: AC | PRN
  Administered 2024-01-23: 500 [IU]

## 2024-01-23 MED ORDER — SODIUM CHLORIDE 0.9% FLUSH
10.0000 mL | Freq: Once | INTRAVENOUS | Status: AC
  Administered 2024-01-23: 10 mL

## 2024-01-23 MED ORDER — DEXAMETHASONE SODIUM PHOSPHATE 10 MG/ML IJ SOLN
10.0000 mg | Freq: Once | INTRAMUSCULAR | Status: AC
  Administered 2024-01-23: 10 mg via INTRAVENOUS
  Filled 2024-01-23: qty 1

## 2024-01-23 MED ORDER — SODIUM CHLORIDE 0.9 % IV SOLN: INTRAVENOUS | Status: DC

## 2024-01-23 MED ORDER — SODIUM CHLORIDE 0.9 % IV SOLN
105.0000 mg/m2 | Freq: Once | INTRAVENOUS | Status: AC
  Administered 2024-01-23: 162 mg via INTRAVENOUS
  Filled 2024-01-23: qty 27

## 2024-01-23 MED ORDER — CYANOCOBALAMIN 1000 MCG/ML IJ SOLN
1000.0000 ug | Freq: Once | INTRAMUSCULAR | Status: AC
  Administered 2024-01-23: 1000 ug via INTRAMUSCULAR
  Filled 2024-01-23: qty 1

## 2024-01-23 MED ORDER — SODIUM CHLORIDE 0.9 % IV SOLN
500.0000 mg | Freq: Once | INTRAVENOUS | Status: AC
  Administered 2024-01-23: 500 mg via INTRAVENOUS
  Filled 2024-01-23: qty 10

## 2024-01-23 MED ORDER — SODIUM CHLORIDE 0.9% FLUSH
10.0000 mL | INTRAVENOUS | Status: DC | PRN
  Administered 2024-01-23: 10 mL

## 2024-01-23 MED ORDER — FAMOTIDINE IN NACL 20-0.9 MG/50ML-% IV SOLN
20.0000 mg | Freq: Once | INTRAVENOUS | Status: AC
  Administered 2024-01-23: 20 mg via INTRAVENOUS
  Filled 2024-01-23: qty 50

## 2024-01-23 NOTE — Assessment & Plan Note (Addendum)
I spent some time counseling the patient the importance of tobacco cessation.  she appears motivated to quit.  

## 2024-01-23 NOTE — Assessment & Plan Note (Addendum)
 The patient was originally diagnosed with high-grade serous uterine cancer in 2017 and had surgery, chemotherapy and radiation treatment She is noted to have disease relapse in January 2025 with metastatic disease to supraclavicular lymphadenopathy, axillary lymphadenopathy and abdominal lymphadenopathy  Pathology: Recurrent high grade serous,CARIS [id number] p53 mutated, MSI stable, pMMR, BRCA neg, HEr2/Neu 2+, ER neg, PDL1 0%  She developed carboplatin  allergy with cycle 3 of treatment PET/CT imaging were reviewed with the patient which show excellent response to therapy.  Even though on the PET/CT imaging report, there is concern for mixed response.  However, I explained to the patient, the PET/CT imaging was done in comparison with her original PET scan dated August 18, 2023.  Due to delay in obtaining tissue biopsy, her chemotherapy did not start until March 7th, 2025.  That difference could account for the changes reported on pet imaging Overall, I believe she has amazing response to treatment  She tolerated treatment well recently without major side effects I plan to repeat imaging study in 2 weeks to reassess and see if she can be transition to immunotherapy only We discussed importance of nicotine cessation

## 2024-01-23 NOTE — Progress Notes (Signed)
 [facility] OFFICE PROGRESS NOTE  Patient Care Team: Katrinka Garnette KIDD, MD as PCP - General (Family Medicine) Carolee Sherwood JONETTA DOUGLAS, MD as Referring Physician (Urology)  Assessment & Plan Endometrial cancer [facility]) The patient was originally diagnosed with high-grade serous uterine cancer in 2017 and had surgery, chemotherapy and radiation treatment She is noted to have disease relapse in January 2025 with metastatic disease to supraclavicular lymphadenopathy, axillary lymphadenopathy and abdominal lymphadenopathy  Pathology: Recurrent high grade serous,CARIS [id number] p53 mutated, MSI stable, pMMR, BRCA neg, HEr2/Neu 2+, ER neg, PDL1 0%  She developed carboplatin  allergy with cycle 3 of treatment PET/CT imaging were reviewed with the patient which show excellent response to therapy.  Even though on the PET/CT imaging report, there is concern for mixed response.  However, I explained to the patient, the PET/CT imaging was done in comparison with her original PET scan dated August 18, 2023.  Due to delay in obtaining tissue biopsy, her chemotherapy did not start until March 7th, 2025.  That difference could account for the changes reported on pet imaging Overall, I believe she has amazing response to treatment  She tolerated treatment well recently without major side effects I plan to repeat imaging study in 2 weeks to reassess and see if she can be transition to immunotherapy only We discussed importance of nicotine cessation Tobacco abuse I spent some time counseling the patient the importance of tobacco cessation. she appears motivated to quit.  Peripheral neuropathy due to chemotherapy [facility]) She denies worsening neuropathy Observe closely Continue cryotherapy Her recent vitamin B12 show adequate replacement and she will continue vitamin B12 injection  Orders Placed This Encounter  Procedures   NM PET Image Restage (PS) Skull Base to Thigh (F-18 FDG)    Standing Status:    Future    Expected Date:   02/06/2024    Expiration Date:   01/22/2025    If indicated for the ordered procedure, I authorize the administration of a radiopharmaceutical per Radiology protocol:   Yes    Preferred imaging location?:   [facility]    Radiology Contrast Protocol - do NOT remove file path:   [email or web address]     Almarie Bedford, MD  INTERVAL HISTORY: she returns for treatment follow-up Complications related to previous cycle of chemotherapy included peripheral neuropathy, She tolerated last cycle therapy well She denies worsening neuropathy Appetite is fair and she has not lost any weight We discussed timing of next imaging and the role of maintenance immunotherapy Unfortunately, she is still smoking a pack of cigarettes per day  PHYSICAL EXAMINATION: ECOG PERFORMANCE STATUS: 1 - Symptomatic but completely ambulatory  No results found for: CAN125    Latest Ref Rng & Units 01/23/2024    8:50 AM 01/02/2024   12:34 PM 12/12/2023    9:59 AM  CBC  WBC 4.0 - 10.5 K/uL 6.6  9.4  7.4   Hemoglobin 12.0 - 15.0 g/dL 87.5  88.0  87.5   Hematocrit 36.0 - 46.0 % 37.0  35.7  36.3   Platelets 150 - 400 K/uL 339  308  340       Chemistry      Component Value Date/Time   NA 137 01/23/2024 0850   NA 142 06/30/2016 1348   K 3.8 01/23/2024 0850   K 3.4 (L) 06/30/2016 1348   CL 104 01/23/2024 0850   CO2 25 01/23/2024 0850   CO2 28 06/30/2016 1348   BUN 17 01/23/2024 0850   BUN  8.7 06/30/2016 1348   CREATININE 0.87 01/23/2024 0850   CREATININE 1.32 (H) 05/07/2020 1519   CREATININE 0.8 06/30/2016 1348      Component Value Date/Time   CALCIUM  9.9 01/23/2024 0850   CALCIUM  10.0 06/30/2016 1348   ALKPHOS 100 01/23/2024 0850   ALKPHOS 88 06/30/2016 1348   AST 16 01/23/2024 0850   AST 17 06/30/2016 1348   ALT 12 01/23/2024 0850   ALT 12 06/30/2016 1348   BILITOT 0.3 01/23/2024 0850   BILITOT 0.31 06/30/2016 1348       Vitals:    01/23/24 0905  BP: 125/82  Pulse: 95  Resp: 18  Temp: 98.9 F (37.2 C)  SpO2: 100%   Filed Weights   01/23/24 0905  Weight: 115 lb (52.2 kg)   Other relevant data reviewed during this visit included CBC and CMP, vitamin B12 level

## 2024-01-23 NOTE — Assessment & Plan Note (Addendum)
 She denies worsening neuropathy Observe closely Continue cryotherapy Her recent vitamin B12 show adequate replacement and she will continue vitamin B12 injection

## 2024-01-23 NOTE — Patient Instructions (Signed)
 [facility] - A DEPT OF . [facility]  Discharge Instructions: Thank you for choosing [facility] to provide your oncology and hematology care.   If you have a lab appointment with the Cancer Center, please go directly to the Cancer Center and check in at the registration area.   Wear comfortable clothing and clothing appropriate for easy access to any Portacath or PICC line.   We strive to give you quality time with your provider. You may need to reschedule your appointment if you arrive late (15 or more minutes).  Arriving late affects you and other patients whose appointments are after yours.  Also, if you miss three or more appointments without notifying the office, you may be dismissed from the clinic at the provider's discretion.      For prescription refill requests, have your pharmacy contact our office and allow 72 hours for refills to be completed.    Today you received the following chemotherapy and/or immunotherapy agents: Jemperli  and Taxol       To help prevent nausea and vomiting after your treatment, we encourage you to take your nausea medication as directed.  BELOW ARE SYMPTOMS THAT SHOULD BE REPORTED IMMEDIATELY: *FEVER GREATER THAN 100.4 F (38 C) OR HIGHER *CHILLS OR SWEATING *NAUSEA AND VOMITING THAT IS NOT CONTROLLED WITH YOUR NAUSEA MEDICATION *UNUSUAL SHORTNESS OF BREATH *UNUSUAL BRUISING OR BLEEDING *URINARY PROBLEMS (pain or burning when urinating, or frequent urination) *BOWEL PROBLEMS (unusual diarrhea, constipation, pain near the anus) TENDERNESS IN MOUTH AND THROAT WITH OR WITHOUT PRESENCE OF ULCERS (sore throat, sores in mouth, or a toothache) UNUSUAL RASH, SWELLING OR PAIN  UNUSUAL VAGINAL DISCHARGE OR ITCHING   Items with * indicate a potential emergency and should be followed up as soon as possible or go to the Emergency Department if any problems should occur.  Please show the CHEMOTHERAPY ALERT CARD or  IMMUNOTHERAPY ALERT CARD at check-in to the Emergency Department and triage nurse.  Should you have questions after your visit or need to cancel or reschedule your appointment, please contact [facility]  Dept: [phone]  and follow the prompts.  Office hours are 8:00 a.m. to 4:30 p.m. Monday - Friday. Please note that voicemails left after 4:00 p.m. may not be returned until the following business day.  We are closed weekends and major holidays. You have access to a nurse at all times for urgent questions. Please call the main number to the clinic Dept: [phone] and follow the prompts.   For any non-urgent questions, you may also contact your provider using MyChart. We now offer e-Visits for anyone 17 and older to request care online for non-urgent symptoms. For details visit [email or web address].   Also download the MyChart app! Go to the app store, search MyChart, open the app, select [place], and log in with your MyChart username and password.

## 2024-01-24 ENCOUNTER — Other Ambulatory Visit: Payer: Self-pay

## 2024-01-24 LAB — T4: T4, Total: 8 ug/dL (ref 4.5–12.0)

## 2024-01-29 DIAGNOSIS — L309 Dermatitis, unspecified: Secondary | ICD-10-CM | POA: Diagnosis not present

## 2024-01-29 DIAGNOSIS — T50905A Adverse effect of unspecified drugs, medicaments and biological substances, initial encounter: Secondary | ICD-10-CM | POA: Diagnosis not present

## 2024-02-06 ENCOUNTER — Encounter (HOSPITAL_COMMUNITY)
Admission: RE | Admit: 2024-02-06 | Discharge: 2024-02-06 | Disposition: A | Discharge status: Home / Self Care | Source: Ambulatory Visit | Attending: Hematology and Oncology | Admitting: Hematology and Oncology

## 2024-02-06 DIAGNOSIS — C541 Malignant neoplasm of endometrium: Secondary | ICD-10-CM | POA: Diagnosis not present

## 2024-02-06 DIAGNOSIS — R918 Other nonspecific abnormal finding of lung field: Secondary | ICD-10-CM | POA: Diagnosis not present

## 2024-02-06 LAB — GLUCOSE, CAPILLARY: Glucose-Capillary: 95 mg/dL (ref 70–99)

## 2024-02-06 MED ORDER — FLUDEOXYGLUCOSE F - 18 (FDG) INJECTION
5.5000 | Freq: Once | INTRAVENOUS | Status: AC
  Administered 2024-02-06: 5.73 via INTRAVENOUS

## 2024-02-08 ENCOUNTER — Telehealth: Payer: Self-pay | Admitting: Oncology

## 2024-02-08 ENCOUNTER — Inpatient Hospital Stay: Attending: Hematology and Oncology | Admitting: Hematology and Oncology

## 2024-02-08 ENCOUNTER — Encounter: Payer: Self-pay | Admitting: Hematology and Oncology

## 2024-02-08 VITALS — BP 98/65 | HR 101 | Temp 98.1°F | Resp 18 | Ht 62.0 in | Wt 111.8 lb

## 2024-02-08 DIAGNOSIS — Z9221 Personal history of antineoplastic chemotherapy: Secondary | ICD-10-CM | POA: Insufficient documentation

## 2024-02-08 DIAGNOSIS — C778 Secondary and unspecified malignant neoplasm of lymph nodes of multiple regions: Secondary | ICD-10-CM | POA: Insufficient documentation

## 2024-02-08 DIAGNOSIS — Z5111 Encounter for antineoplastic chemotherapy: Secondary | ICD-10-CM | POA: Diagnosis not present

## 2024-02-08 DIAGNOSIS — C541 Malignant neoplasm of endometrium: Secondary | ICD-10-CM | POA: Insufficient documentation

## 2024-02-08 DIAGNOSIS — Z923 Personal history of irradiation: Secondary | ICD-10-CM | POA: Diagnosis not present

## 2024-02-08 NOTE — Telephone Encounter (Signed)
 Jacqueline Hammond called back and can come in today to see Dr. Lonn at 2:20.

## 2024-02-08 NOTE — Telephone Encounter (Signed)
 Left a message regarding appointment with Dr. Lonn at 2:20 today to review PET scan results.

## 2024-02-08 NOTE — Progress Notes (Signed)
 [facility] OFFICE PROGRESS NOTE  Patient Care Team: Katrinka Garnette KIDD, MD as PCP - General (Family Medicine) Carolee Sherwood JONETTA DOUGLAS, MD as Referring Physician (Urology)  Assessment & Plan Endometrial cancer [facility]) The patient was originally diagnosed with high-grade serous uterine cancer in 2017 and had surgery, chemotherapy and radiation treatment She is noted to have disease relapse in January 2025 with metastatic disease to supraclavicular lymphadenopathy, axillary lymphadenopathy and abdominal lymphadenopathy  Pathology: Recurrent high grade serous, CARIS [id number] p53 mutated, MSI stable, pMMR, BRCA neg, HEr2/Neu 1+, ER neg, PDL1 0%.  Repeat CARIS 2025: MSI stable, Her2/Neu 2+, PIK3cA mutated, p53 mutated, BRAF not detected, BRCA neg, ER 0%, Her2/neu 2+, PDL-1 0%  She developed carboplatin  allergy with cycle 3 of treatment PET/CT imaging from July 2025 unfortunately showed disease progression We discussed treatment options I recommend consideration for treatment with doxorubicin versus gemcitabine Ultimately, she agreed to proceed with doxorubicin I recommend echocardiogram for baseline heart function before we will proceed with doxorubicin  Orders Placed This Encounter  Procedures   CBC with Differential (Cancer Center Only)    Standing Status:   Future    Expected Date:   02/19/2024    Expiration Date:   02/18/2025   CMP (Cancer Center only)    Standing Status:   Future    Expected Date:   02/19/2024    Expiration Date:   02/18/2025   CBC with Differential (Cancer Center Only)    Standing Status:   Future    Expected Date:   03/19/2024    Expiration Date:   03/19/2025   CMP (Cancer Center only)    Standing Status:   Future    Expected Date:   03/19/2024    Expiration Date:   03/19/2025   CBC with Differential (Cancer Center Only)    Standing Status:   Future    Expected Date:   04/16/2024    Expiration Date:   04/16/2025   CMP (Cancer Center only)    Standing  Status:   Future    Expected Date:   04/16/2024    Expiration Date:   04/16/2025   CBC with Differential (Cancer Center Only)    Standing Status:   Future    Expected Date:   05/14/2024    Expiration Date:   05/14/2025   CMP (Cancer Center only)    Standing Status:   Future    Expected Date:   05/14/2024    Expiration Date:   05/14/2025   CBC with Differential (Cancer Center Only)    Standing Status:   Future    Expected Date:   06/11/2024    Expiration Date:   06/11/2025   CMP (Cancer Center only)    Standing Status:   Future    Expected Date:   06/11/2024    Expiration Date:   06/11/2025   CBC with Differential (Cancer Center Only)    Standing Status:   Future    Expected Date:   07/09/2024    Expiration Date:   07/09/2025   CMP (Cancer Center only)    Standing Status:   Future    Expected Date:   07/09/2024    Expiration Date:   07/09/2025   ECHOCARDIOGRAM COMPLETE    Standing Status:   Future    Expected Date:   02/15/2024    Expiration Date:   02/07/2025    Where should this test be performed:   [facility]    Perflutren DEFINITY (image  enhancing agent) should be administered unless hypersensitivity or allergy exist:   Administer Perflutren    Reason for exam-Echo:   Chemo  Z09     Almarie Bedford, MD  INTERVAL HISTORY: she returns for surveillance follow-up with her husband She returns to review PET/CT imaging result She denies major side effects from her recent treatment I spent majority of the time reviewing multiple PET/CT imaging, comparing the scan from January 2025, May 2025 and July 2025  PHYSICAL EXAMINATION: ECOG PERFORMANCE STATUS: 1 - Symptomatic but completely ambulatory  Vitals:   02/08/24 1432  BP: 98/65  Pulse: (!) 101  Resp: 18  Temp: 98.1 F (36.7 C)  SpO2: 97%   Filed Weights   02/08/24 1432  Weight: 111 lb 12.8 oz (50.7 kg)    Relevant data reviewed during this visit included PET/CT imaging as above

## 2024-02-08 NOTE — Progress Notes (Signed)
 DISCONTINUE OFF PATHWAY REGIMEN - Uterine   [id number]:Carboplatin  AUC=5 IV D1 + Dostarlimab  500 mg IV D1 + Paclitaxel  175 mg/m2 IV D1 x 6 Cycles Followed by Dostarlimab  1,000 mg IV D1 q42 Days:   Cycles 1 through 6: A cycle is every 21 days:     Dostarlimab -gxly      Paclitaxel       Carboplatin     Cycles 7 and beyond: A cycle is every 42 days:     Dostarlimab -gxly   **Always confirm dose/schedule in your pharmacy ordering system**  PRIOR TREATMENT: Off Pathway: Carboplatin  AUC=5 IV D1 + Dostarlimab  500 mg IV D1 + Paclitaxel  175 mg/m2 IV D1 x 6 Cycles Followed by Dostarlimab  1,000 mg IV D1 q42 Days  START OFF PATHWAY REGIMEN - Uterine   [id number]:Liposomal Doxorubicin 40 mg/m2 IV D1 q28 Days:   A cycle is every 28 days:     Liposomal doxorubicin   **Always confirm dose/schedule in your pharmacy ordering system**  Patient Characteristics: Serous Carcinoma, Recurrent/Progressive Disease, Third Line and Beyond, HER2 Negative/Unknown Histology: Serous Carcinoma Therapeutic Status: Recurrent or Progressive Disease Line of Therapy: Third Line and Beyond HER2 Status: Negative Intent of Therapy: Non-Curative / Palliative Intent, Discussed with Patient

## 2024-02-08 NOTE — Assessment & Plan Note (Addendum)
 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  Repeat CARIS 2025: MSI stable, Her2/Neu 2+, PIK3cA mutated, p53 mutated, BRAF not detected, BRCA neg, ER 0%, Her2/neu 2+, PDL-1 0%  She developed carboplatin  allergy with cycle 3 of treatment PET/CT imaging from July 2025 unfortunately showed disease progression We discussed treatment options I recommend consideration for treatment with doxorubicin versus gemcitabine Ultimately, she agreed to proceed with doxorubicin I recommend echocardiogram for baseline heart function before we will proceed with doxorubicin

## 2024-02-09 ENCOUNTER — Other Ambulatory Visit: Payer: Self-pay

## 2024-02-09 ENCOUNTER — Encounter: Payer: Self-pay | Admitting: Hematology and Oncology

## 2024-02-10 ENCOUNTER — Other Ambulatory Visit: Payer: Self-pay

## 2024-02-12 ENCOUNTER — Inpatient Hospital Stay: Admitting: Hematology and Oncology

## 2024-02-12 ENCOUNTER — Inpatient Hospital Stay

## 2024-02-12 NOTE — Progress Notes (Signed)
 Pharmacist Chemotherapy Monitoring - Initial Assessment    Anticipated start date: 02/19/24   The following has been reviewed per standard work regarding the patient's treatment regimen: The patient's diagnosis, treatment plan and drug doses, and organ/hematologic function Lab orders and baseline tests specific to treatment regimen  The treatment plan start date, drug sequencing, and pre-medications Prior authorization status  Patient's documented medication list, including drug-drug interaction screen and prescriptions for anti-emetics and supportive care specific to the treatment regimen The drug concentrations, fluid compatibility, administration routes, and timing of the medications to be used The patient's access for treatment and lifetime cumulative dose history, if applicable  The patient's medication allergies and previous infusion related reactions, if applicable   Changes made to treatment plan:  N/A  Follow up needed:  Pending authorization for treatment  ECHO scheduled 02/16/24   Marlee Eleanor Neighbors, [facility], 02/12/2024  12:25 PM

## 2024-02-13 ENCOUNTER — Ambulatory Visit

## 2024-02-15 ENCOUNTER — Other Ambulatory Visit: Payer: Self-pay

## 2024-02-16 ENCOUNTER — Ambulatory Visit (HOSPITAL_COMMUNITY)
Admission: RE | Admit: 2024-02-16 | Discharge: 2024-02-16 | Disposition: A | Discharge status: Home / Self Care | Source: Ambulatory Visit | Attending: Hematology and Oncology | Admitting: Hematology and Oncology

## 2024-02-16 DIAGNOSIS — Z87891 Personal history of nicotine dependence: Secondary | ICD-10-CM | POA: Insufficient documentation

## 2024-02-16 DIAGNOSIS — C541 Malignant neoplasm of endometrium: Secondary | ICD-10-CM | POA: Insufficient documentation

## 2024-02-16 DIAGNOSIS — Z8679 Personal history of other diseases of the circulatory system: Secondary | ICD-10-CM | POA: Insufficient documentation

## 2024-02-16 DIAGNOSIS — I519 Heart disease, unspecified: Secondary | ICD-10-CM | POA: Diagnosis not present

## 2024-02-16 DIAGNOSIS — E785 Hyperlipidemia, unspecified: Secondary | ICD-10-CM | POA: Insufficient documentation

## 2024-02-16 DIAGNOSIS — Z08 Encounter for follow-up examination after completed treatment for malignant neoplasm: Secondary | ICD-10-CM | POA: Insufficient documentation

## 2024-02-16 DIAGNOSIS — I358 Other nonrheumatic aortic valve disorders: Secondary | ICD-10-CM | POA: Diagnosis not present

## 2024-02-16 DIAGNOSIS — Z0189 Encounter for other specified special examinations: Secondary | ICD-10-CM | POA: Diagnosis not present

## 2024-02-16 LAB — ECHOCARDIOGRAM COMPLETE
Area-P 1/2: 3.6 cm2
MV M vel: 3.98 m/s
MV Peak grad: 63.2 mmHg
S' Lateral: 2.2 cm

## 2024-02-16 NOTE — Progress Notes (Signed)
 Echocardiogram 2D Echocardiogram has been performed.  Koleen KANDICE Popper, RDCS 02/16/2024, 10:51 AM

## 2024-02-19 ENCOUNTER — Inpatient Hospital Stay

## 2024-02-19 ENCOUNTER — Encounter: Payer: Self-pay | Admitting: Hematology and Oncology

## 2024-02-19 VITALS — BP 121/75 | HR 86 | Temp 97.9°F | Resp 16

## 2024-02-19 DIAGNOSIS — C541 Malignant neoplasm of endometrium: Secondary | ICD-10-CM

## 2024-02-19 DIAGNOSIS — Z5111 Encounter for antineoplastic chemotherapy: Secondary | ICD-10-CM | POA: Diagnosis not present

## 2024-02-19 LAB — CMP (CANCER CENTER ONLY)
ALT: 14 U/L (ref 0–44)
AST: 16 U/L (ref 15–41)
Albumin: 3.9 g/dL (ref 3.5–5.0)
Alkaline Phosphatase: 106 U/L (ref 38–126)
Anion gap: 8 (ref 5–15)
BUN: 18 mg/dL (ref 8–23)
CO2: 26 mmol/L (ref 22–32)
Calcium: 9.8 mg/dL (ref 8.9–10.3)
Chloride: 107 mmol/L (ref 98–111)
Creatinine: 0.92 mg/dL (ref 0.44–1.00)
GFR, Estimated: >60 mL/min (ref >60)
Glucose, Bld: 100 mg/dL — ABNORMAL HIGH (ref 70–99)
Potassium: 3.6 mmol/L (ref 3.5–5.1)
Sodium: 141 mmol/L (ref 135–145)
Total Bilirubin: 0.3 mg/dL (ref 0.0–1.2)
Total Protein: 6.9 g/dL (ref 6.5–8.1)

## 2024-02-19 LAB — CBC WITH DIFFERENTIAL (CANCER CENTER ONLY)
Abs Immature Granulocytes: 0.06 K/uL (ref 0.00–0.07)
Basophils Absolute: 0 K/uL (ref 0.0–0.1)
Basophils Relative: 0 %
Eosinophils Absolute: 0 K/uL (ref 0.0–0.5)
Eosinophils Relative: 0 %
HCT: 36.3 % (ref 36.0–46.0)
Hemoglobin: 12.3 g/dL (ref 12.0–15.0)
Immature Granulocytes: 0 %
Lymphocytes Relative: 4 %
Lymphs Abs: 0.7 K/uL (ref 0.7–4.0)
MCH: 33.9 pg (ref 26.0–34.0)
MCHC: 33.9 g/dL (ref 30.0–36.0)
MCV: 100 fL (ref 80.0–100.0)
Monocytes Absolute: 0.4 K/uL (ref 0.1–1.0)
Monocytes Relative: 2 %
Neutro Abs: 15.6 K/uL — ABNORMAL HIGH (ref 1.7–7.7)
Neutrophils Relative %: 94 %
Platelet Count: 257 K/uL (ref 150–400)
RBC: 3.63 MIL/uL — ABNORMAL LOW (ref 3.87–5.11)
RDW: 14.6 % (ref 11.5–15.5)
WBC Count: 16.9 K/uL — ABNORMAL HIGH (ref 4.0–10.5)
nRBC: 0 % (ref 0.0–0.2)

## 2024-02-19 MED ORDER — SODIUM CHLORIDE 0.9% FLUSH: 10.0000 mL | INTRAVENOUS | Status: DC | PRN

## 2024-02-19 MED ORDER — SODIUM CHLORIDE 0.9% FLUSH
10.0000 mL | Freq: Once | INTRAVENOUS | Status: AC
Start: 2024-02-19 — End: 2024-02-19
  Administered 2024-02-19: 10 mL

## 2024-02-19 MED ORDER — CYANOCOBALAMIN 1000 MCG/ML IJ SOLN
1000.0000 ug | Freq: Once | INTRAMUSCULAR | Status: AC
  Administered 2024-02-19: 1000 ug via INTRAMUSCULAR
  Filled 2024-02-19: qty 1

## 2024-02-19 MED ORDER — HEPARIN SOD (PORK) LOCK FLUSH 100 UNIT/ML IV SOLN: 500.0000 [IU] | Freq: Once | INTRAVENOUS | Status: DC | PRN

## 2024-02-19 MED ORDER — DEXAMETHASONE SODIUM PHOSPHATE 10 MG/ML IJ SOLN
10.0000 mg | Freq: Once | INTRAMUSCULAR | Status: AC
  Administered 2024-02-19: 10 mg via INTRAVENOUS
  Filled 2024-02-19: qty 1

## 2024-02-19 MED ORDER — DOXORUBICIN HCL LIPOSOMAL CHEMO INJECTION 2 MG/ML
40.0000 mg/m2 | Freq: Once | INTRAVENOUS | Status: AC
  Administered 2024-02-19: 60 mg via INTRAVENOUS
  Filled 2024-02-19: qty 30

## 2024-02-19 MED ORDER — DEXTROSE 5 % IV SOLN: INTRAVENOUS | Status: DC

## 2024-02-19 NOTE — Patient Instructions (Signed)
 [facility] - A DEPT OF [place]  Discharge Instructions: Thank you for choosing [facility] to provide your oncology and hematology care.   If you have a lab appointment with the Cancer Center, please go directly to the Cancer Center and check in at the registration area.   Wear comfortable clothing and clothing appropriate for easy access to any Portacath or PICC line.   We strive to give you quality time with your provider. You may need to reschedule your appointment if you arrive late (15 or more minutes).  Arriving late affects you and other patients whose appointments are after yours.  Also, if you miss three or more appointments without notifying the office, you may be dismissed from the clinic at the provider's discretion.      For prescription refill requests, have your pharmacy contact our office and allow 72 hours for refills to be completed.    Today you received the following chemotherapy and/or immunotherapy agents doxil       To help prevent nausea and vomiting after your treatment, we encourage you to take your nausea medication as directed.  BELOW ARE SYMPTOMS THAT SHOULD BE REPORTED IMMEDIATELY: *FEVER GREATER THAN 100.4 F (38 C) OR HIGHER *CHILLS OR SWEATING *NAUSEA AND VOMITING THAT IS NOT CONTROLLED WITH YOUR NAUSEA MEDICATION *UNUSUAL SHORTNESS OF BREATH *UNUSUAL BRUISING OR BLEEDING *URINARY PROBLEMS (pain or burning when urinating, or frequent urination) *BOWEL PROBLEMS (unusual diarrhea, constipation, pain near the anus) TENDERNESS IN MOUTH AND THROAT WITH OR WITHOUT PRESENCE OF ULCERS (sore throat, sores in mouth, or a toothache) UNUSUAL RASH, SWELLING OR PAIN  UNUSUAL VAGINAL DISCHARGE OR ITCHING   Items with * indicate a potential emergency and should be followed up as soon as possible or go to the Emergency Department if any problems should occur.  Please show the CHEMOTHERAPY ALERT CARD or IMMUNOTHERAPY ALERT  CARD at check-in to the Emergency Department and triage nurse.  Should you have questions after your visit or need to cancel or reschedule your appointment, please contact [facility]  Dept: [phone]  and follow the prompts.  Office hours are 8:00 a.m. to 4:30 p.m. Monday - Friday. Please note that voicemails left after 4:00 p.m. may not be returned until the following business day.  We are closed weekends and major holidays. You have access to a nurse at all times for urgent questions. Please call the main number to the clinic Dept: [phone] and follow the prompts.   For any non-urgent questions, you may also contact your provider using MyChart. We now offer e-Visits for anyone 23 and older to request care online for non-urgent symptoms. For details visit [email or web address].   Also download the MyChart app! Go to the app store, search MyChart, open the app, select , and log in with your MyChart username and password.

## 2024-02-22 ENCOUNTER — Other Ambulatory Visit: Payer: Self-pay

## 2024-02-29 ENCOUNTER — Other Ambulatory Visit: Payer: Self-pay | Admitting: Hematology and Oncology

## 2024-02-29 ENCOUNTER — Telehealth: Payer: Self-pay | Admitting: Oncology

## 2024-02-29 NOTE — Telephone Encounter (Signed)
 No need to take dexamethasone 

## 2024-02-29 NOTE — Telephone Encounter (Signed)
 Jacqueline Hammond called and said she has 3 dexamethasone  tablets left and is wondering if she can get a refill because she has chemo scheduled on 03/19/24.  Advised that she may not need to take it now since she is getting Doxil .  Advised I will check with Dr. Lonn and call her back.

## 2024-02-29 NOTE — Telephone Encounter (Signed)
 Left a message for Jacqueline Hammond that she can stop taking dexamethasone  before chemo.  Requested a call back if she has any questions.

## 2024-03-05 DIAGNOSIS — D692 Other nonthrombocytopenic purpura: Secondary | ICD-10-CM | POA: Diagnosis not present

## 2024-03-05 DIAGNOSIS — L821 Other seborrheic keratosis: Secondary | ICD-10-CM | POA: Diagnosis not present

## 2024-03-05 DIAGNOSIS — L814 Other melanin hyperpigmentation: Secondary | ICD-10-CM | POA: Diagnosis not present

## 2024-03-05 DIAGNOSIS — D1801 Hemangioma of skin and subcutaneous tissue: Secondary | ICD-10-CM | POA: Diagnosis not present

## 2024-03-05 DIAGNOSIS — Z08 Encounter for follow-up examination after completed treatment for malignant neoplasm: Secondary | ICD-10-CM | POA: Diagnosis not present

## 2024-03-05 DIAGNOSIS — Z85828 Personal history of other malignant neoplasm of skin: Secondary | ICD-10-CM | POA: Diagnosis not present

## 2024-03-19 ENCOUNTER — Inpatient Hospital Stay: Admitting: Hematology and Oncology

## 2024-03-19 ENCOUNTER — Encounter: Payer: Self-pay | Admitting: Hematology and Oncology

## 2024-03-19 ENCOUNTER — Inpatient Hospital Stay: Attending: Hematology and Oncology

## 2024-03-19 ENCOUNTER — Inpatient Hospital Stay

## 2024-03-19 VITALS — HR 100

## 2024-03-19 VITALS — BP 95/77 | HR 117 | Temp 98.1°F | Resp 18 | Ht 62.0 in | Wt 110.0 lb

## 2024-03-19 DIAGNOSIS — D6481 Anemia due to antineoplastic chemotherapy: Secondary | ICD-10-CM | POA: Diagnosis not present

## 2024-03-19 DIAGNOSIS — Z923 Personal history of irradiation: Secondary | ICD-10-CM | POA: Insufficient documentation

## 2024-03-19 DIAGNOSIS — R634 Abnormal weight loss: Secondary | ICD-10-CM

## 2024-03-19 DIAGNOSIS — Z720 Tobacco use: Secondary | ICD-10-CM

## 2024-03-19 DIAGNOSIS — T451X5A Adverse effect of antineoplastic and immunosuppressive drugs, initial encounter: Secondary | ICD-10-CM | POA: Diagnosis not present

## 2024-03-19 DIAGNOSIS — Z9221 Personal history of antineoplastic chemotherapy: Secondary | ICD-10-CM | POA: Insufficient documentation

## 2024-03-19 DIAGNOSIS — C541 Malignant neoplasm of endometrium: Secondary | ICD-10-CM

## 2024-03-19 DIAGNOSIS — Z5111 Encounter for antineoplastic chemotherapy: Secondary | ICD-10-CM | POA: Diagnosis not present

## 2024-03-19 LAB — CMP (CANCER CENTER ONLY)
ALT: 11 U/L (ref 0–44)
AST: 17 U/L (ref 15–41)
Albumin: 3.7 g/dL (ref 3.5–5.0)
Alkaline Phosphatase: 114 U/L (ref 38–126)
Anion gap: 6 (ref 5–15)
BUN: 15 mg/dL (ref 8–23)
CO2: 28 mmol/L (ref 22–32)
Calcium: 9.3 mg/dL (ref 8.9–10.3)
Chloride: 105 mmol/L (ref 98–111)
Creatinine: 0.78 mg/dL (ref 0.44–1.00)
GFR, Estimated: >60 mL/min (ref >60)
Glucose, Bld: 87 mg/dL (ref 70–99)
Potassium: 3.8 mmol/L (ref 3.5–5.1)
Sodium: 139 mmol/L (ref 135–145)
Total Bilirubin: 0.3 mg/dL (ref 0.0–1.2)
Total Protein: 6.5 g/dL (ref 6.5–8.1)

## 2024-03-19 LAB — CBC WITH DIFFERENTIAL (CANCER CENTER ONLY)
Abs Immature Granulocytes: 0.02 K/uL (ref 0.00–0.07)
Basophils Absolute: 0.1 K/uL (ref 0.0–0.1)
Basophils Relative: 1 %
Eosinophils Absolute: 0 K/uL (ref 0.0–0.5)
Eosinophils Relative: 0 %
HCT: 35.9 % — ABNORMAL LOW (ref 36.0–46.0)
Hemoglobin: 11.9 g/dL — ABNORMAL LOW (ref 12.0–15.0)
Immature Granulocytes: 0 %
Lymphocytes Relative: 22 %
Lymphs Abs: 1.1 K/uL (ref 0.7–4.0)
MCH: 33.1 pg (ref 26.0–34.0)
MCHC: 33.1 g/dL (ref 30.0–36.0)
MCV: 99.7 fL (ref 80.0–100.0)
Monocytes Absolute: 0.7 K/uL (ref 0.1–1.0)
Monocytes Relative: 14 %
Neutro Abs: 3.1 K/uL (ref 1.7–7.7)
Neutrophils Relative %: 63 %
Platelet Count: 371 K/uL (ref 150–400)
RBC: 3.6 MIL/uL — ABNORMAL LOW (ref 3.87–5.11)
RDW: 15.9 % — ABNORMAL HIGH (ref 11.5–15.5)
WBC Count: 4.9 K/uL (ref 4.0–10.5)
nRBC: 0 % (ref 0.0–0.2)

## 2024-03-19 MED ORDER — CYANOCOBALAMIN 1000 MCG/ML IJ SOLN
1000.0000 ug | Freq: Once | INTRAMUSCULAR | Status: AC
  Administered 2024-03-19: 1000 ug via INTRAMUSCULAR
  Filled 2024-03-19: qty 1

## 2024-03-19 MED ORDER — DEXTROSE 5 % IV SOLN: INTRAVENOUS | Status: DC

## 2024-03-19 MED ORDER — DEXAMETHASONE SODIUM PHOSPHATE 10 MG/ML IJ SOLN
10.0000 mg | Freq: Once | INTRAMUSCULAR | Status: AC
  Administered 2024-03-19: 10 mg via INTRAVENOUS
  Filled 2024-03-19: qty 1

## 2024-03-19 MED ORDER — SODIUM CHLORIDE 0.9% FLUSH
10.0000 mL | Freq: Once | INTRAVENOUS | Status: AC
  Administered 2024-03-19: 10 mL

## 2024-03-19 MED ORDER — SODIUM CHLORIDE 0.9% FLUSH: 10.0000 mL | INTRAVENOUS | Status: DC | PRN

## 2024-03-19 MED ORDER — DOXORUBICIN HCL LIPOSOMAL CHEMO INJECTION 2 MG/ML
40.0000 mg/m2 | Freq: Once | INTRAVENOUS | Status: AC
  Administered 2024-03-19: 60 mg via INTRAVENOUS
  Filled 2024-03-19: qty 30

## 2024-03-19 NOTE — Assessment & Plan Note (Addendum)
I spent some time counseling the patient the importance of tobacco cessation.  she appears motivated to quit.  

## 2024-03-19 NOTE — Patient Instructions (Signed)
 [facility] - A DEPT OF [place]  Discharge Instructions: Thank you for choosing [facility] to provide your oncology and hematology care.   If you have a lab appointment with the Cancer Center, please go directly to the Cancer Center and check in at the registration area.   Wear comfortable clothing and clothing appropriate for easy access to any Portacath or PICC line.   We strive to give you quality time with your provider. You may need to reschedule your appointment if you arrive late (15 or more minutes).  Arriving late affects you and other patients whose appointments are after yours.  Also, if you miss three or more appointments without notifying the office, you may be dismissed from the clinic at the provider's discretion.      For prescription refill requests, have your pharmacy contact our office and allow 72 hours for refills to be completed.    Today you received the following chemotherapy and/or immunotherapy agents doxil       To help prevent nausea and vomiting after your treatment, we encourage you to take your nausea medication as directed.  BELOW ARE SYMPTOMS THAT SHOULD BE REPORTED IMMEDIATELY: *FEVER GREATER THAN 100.4 F (38 C) OR HIGHER *CHILLS OR SWEATING *NAUSEA AND VOMITING THAT IS NOT CONTROLLED WITH YOUR NAUSEA MEDICATION *UNUSUAL SHORTNESS OF BREATH *UNUSUAL BRUISING OR BLEEDING *URINARY PROBLEMS (pain or burning when urinating, or frequent urination) *BOWEL PROBLEMS (unusual diarrhea, constipation, pain near the anus) TENDERNESS IN MOUTH AND THROAT WITH OR WITHOUT PRESENCE OF ULCERS (sore throat, sores in mouth, or a toothache) UNUSUAL RASH, SWELLING OR PAIN  UNUSUAL VAGINAL DISCHARGE OR ITCHING   Items with * indicate a potential emergency and should be followed up as soon as possible or go to the Emergency Department if any problems should occur.  Please show the CHEMOTHERAPY ALERT CARD or IMMUNOTHERAPY ALERT  CARD at check-in to the Emergency Department and triage nurse.  Should you have questions after your visit or need to cancel or reschedule your appointment, please contact [facility]  Dept: [phone]  and follow the prompts.  Office hours are 8:00 a.m. to 4:30 p.m. Monday - Friday. Please note that voicemails left after 4:00 p.m. may not be returned until the following business day.  We are closed weekends and major holidays. You have access to a nurse at all times for urgent questions. Please call the main number to the clinic Dept: [phone] and follow the prompts.   For any non-urgent questions, you may also contact your provider using MyChart. We now offer e-Visits for anyone 23 and older to request care online for non-urgent symptoms. For details visit [email or web address].   Also download the MyChart app! Go to the app store, search MyChart, open the app, select , and log in with your MyChart username and password.

## 2024-03-19 NOTE — Progress Notes (Signed)
 [facility] OFFICE PROGRESS NOTE  Patient Care Team: Katrinka Garnette KIDD, MD as PCP - General (Family Medicine) Carolee Sherwood JONETTA DOUGLAS, MD as Referring Physician (Urology)  Assessment & Plan Endometrial cancer [facility]) The patient was originally diagnosed with high-grade serous uterine cancer in 2017 and had surgery, chemotherapy and radiation treatment She is noted to have disease relapse in January 2025 with metastatic disease to supraclavicular lymphadenopathy, axillary lymphadenopathy and abdominal lymphadenopathy  Pathology: Recurrent high grade serous, CARIS [id number] p53 mutated, MSI stable, pMMR, BRCA neg, HEr2/Neu 1+, ER neg, PDL1 0%.  Repeat CARIS 2025: MSI stable, Her2/Neu 2+, PIK3cA mutated, p53 mutated, BRAF not detected, BRCA neg, ER 0%, Her2/neu 2+, PDL-1 0%  She developed carboplatin  allergy with cycle 3 of treatment PET/CT imaging from July 2025 unfortunately showed disease progression She elected to proceed with second line treatment with liposomal doxorubicin  Echocardiogram from July 2025 was normal  So far, she tolerated cycle 1 of treatment well without major side effects We will proceed with cycle 2 of treatment without delay I plan to repeat imaging study after cycle 3 Tobacco abuse I spent some time counseling the patient the importance of tobacco cessation. she appears motivated to quit.  Weight loss, abnormal We discussed importance of frequent small meals and high-protein diet  Anemia due to antineoplastic chemotherapy This is likely due to recent treatment. The patient denies recent history of bleeding such as epistaxis, hematuria or hematochezia. She is asymptomatic from the anemia. I will observe for now.  She does not require transfusion now. I will continue the chemotherapy at current dose without dosage adjustment.  If the anemia gets progressive worse in the future, I might have to delay her treatment or adjust the chemotherapy dose.   No  orders of the defined types were placed in this encounter.    Almarie Bedford, MD  INTERVAL HISTORY: she returns for treatment follow-up Complications related to previous cycle of chemotherapy included anemia, and weight loss, She denies mouth sores, hand-foot syndrome or nausea No signs or symptoms of congestive heart failure  PHYSICAL EXAMINATION: ECOG PERFORMANCE STATUS: 1 - Symptomatic but completely ambulatory  No results found for: CAN125    Latest Ref Rng & Units 03/19/2024    1:23 PM 02/19/2024   12:30 PM 01/23/2024    8:50 AM  CBC  WBC 4.0 - 10.5 K/uL 4.9  16.9  6.6   Hemoglobin 12.0 - 15.0 g/dL 88.0  87.6  87.5   Hematocrit 36.0 - 46.0 % 35.9  36.3  37.0   Platelets 150 - 400 K/uL 371  257  339       Chemistry      Component Value Date/Time   NA 141 02/19/2024 1230   NA 142 06/30/2016 1348   K 3.6 02/19/2024 1230   K 3.4 (L) 06/30/2016 1348   CL 107 02/19/2024 1230   CO2 26 02/19/2024 1230   CO2 28 06/30/2016 1348   BUN 18 02/19/2024 1230   BUN 8.7 06/30/2016 1348   CREATININE 0.92 02/19/2024 1230   CREATININE 1.32 (H) 05/07/2020 1519   CREATININE 0.8 06/30/2016 1348      Component Value Date/Time   CALCIUM  9.8 02/19/2024 1230   CALCIUM  10.0 06/30/2016 1348   ALKPHOS 106 02/19/2024 1230   ALKPHOS 88 06/30/2016 1348   AST 16 02/19/2024 1230   AST 17 06/30/2016 1348   ALT 14 02/19/2024 1230   ALT 12 06/30/2016 1348   BILITOT 0.3 02/19/2024 1230  BILITOT 0.31 06/30/2016 1348       Vitals:   03/19/24 1349  BP: 95/77  Pulse: (!) 117  Resp: 18  Temp: 98.1 F (36.7 C)  SpO2: 98%   Filed Weights   03/19/24 1349  Weight: 110 lb (49.9 kg)   Other relevant data reviewed during this visit included CBC and CMP

## 2024-03-19 NOTE — Assessment & Plan Note (Addendum)
 We discussed importance of frequent small meals and high-protein diet

## 2024-03-19 NOTE — Assessment & Plan Note (Addendum)
 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  Repeat CARIS 2025: MSI stable, Her2/Neu 2+, PIK3cA mutated, p53 mutated, BRAF not detected, BRCA neg, ER 0%, Her2/neu 2+, PDL-1 0%  She developed carboplatin  allergy with cycle 3 of treatment PET/CT imaging from July 2025 unfortunately showed disease progression She elected to proceed with second line treatment with liposomal doxorubicin  Echocardiogram from July 2025 was normal  So far, she tolerated cycle 1 of treatment well without major side effects We will proceed with cycle 2 of treatment without delay I plan to repeat imaging study after cycle 3

## 2024-03-19 NOTE — Assessment & Plan Note (Addendum)
 This is likely due to recent treatment. The patient denies recent history of bleeding such as epistaxis, hematuria or hematochezia. She is asymptomatic from the anemia. I will observe for now.  She does not require transfusion now. I will continue the chemotherapy at current dose without dosage adjustment.  If the anemia gets progressive worse in the future, I might have to delay her treatment or adjust the chemotherapy dose.

## 2024-03-20 ENCOUNTER — Other Ambulatory Visit: Payer: Self-pay

## 2024-04-06 ENCOUNTER — Other Ambulatory Visit: Payer: Self-pay

## 2024-04-16 ENCOUNTER — Inpatient Hospital Stay: Admitting: Nutrition

## 2024-04-16 ENCOUNTER — Inpatient Hospital Stay: Attending: Hematology and Oncology

## 2024-04-16 ENCOUNTER — Inpatient Hospital Stay: Admitting: Hematology and Oncology

## 2024-04-16 ENCOUNTER — Inpatient Hospital Stay

## 2024-04-16 ENCOUNTER — Encounter: Payer: Self-pay | Admitting: Hematology and Oncology

## 2024-04-16 VITALS — HR 92

## 2024-04-16 VITALS — BP 102/68 | HR 111 | Temp 97.8°F | Resp 18 | Ht 62.0 in | Wt 110.6 lb

## 2024-04-16 DIAGNOSIS — R634 Abnormal weight loss: Secondary | ICD-10-CM | POA: Diagnosis not present

## 2024-04-16 DIAGNOSIS — Z5111 Encounter for antineoplastic chemotherapy: Secondary | ICD-10-CM | POA: Insufficient documentation

## 2024-04-16 DIAGNOSIS — C778 Secondary and unspecified malignant neoplasm of lymph nodes of multiple regions: Secondary | ICD-10-CM | POA: Diagnosis not present

## 2024-04-16 DIAGNOSIS — C541 Malignant neoplasm of endometrium: Secondary | ICD-10-CM

## 2024-04-16 DIAGNOSIS — Z720 Tobacco use: Secondary | ICD-10-CM

## 2024-04-16 DIAGNOSIS — F17210 Nicotine dependence, cigarettes, uncomplicated: Secondary | ICD-10-CM | POA: Insufficient documentation

## 2024-04-16 DIAGNOSIS — R5383 Other fatigue: Secondary | ICD-10-CM | POA: Diagnosis not present

## 2024-04-16 DIAGNOSIS — R630 Anorexia: Secondary | ICD-10-CM | POA: Diagnosis not present

## 2024-04-16 LAB — CBC WITH DIFFERENTIAL (CANCER CENTER ONLY)
Abs Immature Granulocytes: 0.01 K/uL (ref 0.00–0.07)
Basophils Absolute: 0.1 K/uL (ref 0.0–0.1)
Basophils Relative: 1 %
Eosinophils Absolute: 0 K/uL (ref 0.0–0.5)
Eosinophils Relative: 1 %
HCT: 33.4 % — ABNORMAL LOW (ref 36.0–46.0)
Hemoglobin: 11.2 g/dL — ABNORMAL LOW (ref 12.0–15.0)
Immature Granulocytes: 0 %
Lymphocytes Relative: 19 %
Lymphs Abs: 1 K/uL (ref 0.7–4.0)
MCH: 33.3 pg (ref 26.0–34.0)
MCHC: 33.5 g/dL (ref 30.0–36.0)
MCV: 99.4 fL (ref 80.0–100.0)
Monocytes Absolute: 0.9 K/uL (ref 0.1–1.0)
Monocytes Relative: 18 %
Neutro Abs: 3 K/uL (ref 1.7–7.7)
Neutrophils Relative %: 61 %
Platelet Count: 356 K/uL (ref 150–400)
RBC: 3.36 MIL/uL — ABNORMAL LOW (ref 3.87–5.11)
RDW: 17.8 % — ABNORMAL HIGH (ref 11.5–15.5)
WBC Count: 4.9 K/uL (ref 4.0–10.5)
nRBC: 0 % (ref 0.0–0.2)

## 2024-04-16 LAB — CMP (CANCER CENTER ONLY)
ALT: 10 U/L (ref 0–44)
AST: 15 U/L (ref 15–41)
Albumin: 3.4 g/dL — ABNORMAL LOW (ref 3.5–5.0)
Alkaline Phosphatase: 125 U/L (ref 38–126)
Anion gap: 5 (ref 5–15)
BUN: 15 mg/dL (ref 8–23)
CO2: 27 mmol/L (ref 22–32)
Calcium: 9.1 mg/dL (ref 8.9–10.3)
Chloride: 108 mmol/L (ref 98–111)
Creatinine: 0.8 mg/dL (ref 0.44–1.00)
GFR, Estimated: >60 mL/min (ref >60)
Glucose, Bld: 103 mg/dL — ABNORMAL HIGH (ref 70–99)
Potassium: 3.4 mmol/L — ABNORMAL LOW (ref 3.5–5.1)
Sodium: 140 mmol/L (ref 135–145)
Total Bilirubin: 0.3 mg/dL (ref 0.0–1.2)
Total Protein: 6.2 g/dL — ABNORMAL LOW (ref 6.5–8.1)

## 2024-04-16 MED ORDER — DEXTROSE 5 % IV SOLN: INTRAVENOUS | Status: DC

## 2024-04-16 MED ORDER — DEXAMETHASONE SODIUM PHOSPHATE 10 MG/ML IJ SOLN
10.0000 mg | Freq: Once | INTRAMUSCULAR | Status: AC
  Administered 2024-04-16: 10 mg via INTRAVENOUS
  Filled 2024-04-16: qty 1

## 2024-04-16 MED ORDER — CYANOCOBALAMIN 1000 MCG/ML IJ SOLN
1000.0000 ug | Freq: Once | INTRAMUSCULAR | Status: AC
  Administered 2024-04-16: 1000 ug via INTRAMUSCULAR
  Filled 2024-04-16: qty 1

## 2024-04-16 MED ORDER — DOXORUBICIN HCL LIPOSOMAL CHEMO INJECTION 2 MG/ML
40.0000 mg/m2 | Freq: Once | INTRAVENOUS | Status: AC
  Administered 2024-04-16: 60 mg via INTRAVENOUS
  Filled 2024-04-16: qty 30

## 2024-04-16 NOTE — Assessment & Plan Note (Addendum)
 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  Repeat CARIS 2025: MSI stable, Her2/Neu 2+, PIK3cA mutated, p53 mutated, BRAF not detected, BRCA neg, ER 0%, Her2/neu 2+, PDL-1 0%  She developed carboplatin  allergy with cycle 3 of treatment PET/CT imaging from July 2025 unfortunately showed disease progression She elected to proceed with second line treatment with liposomal doxorubicin  Echocardiogram from July 2025 was normal  So far, she tolerated cycle 1& 2 of treatment well without major side effects We will proceed with cycle 3 today I plan to repeat imaging study in 2 weeks for objective assessment of response to therapy

## 2024-04-16 NOTE — Progress Notes (Signed)
 Patient identified to be at risk for malnutrition on the MST due to poor appetite and weight loss.    73 year old female diagnosed with endometrial cancer in 2017 status post chemoradiation therapy and surgery.  In January 2025 she had disease relapse with metastatic disease to supraclavicular lymphadenopathy, axillary lymphadenopathy and abdominal lymphadenopathy.  In July 2025, she had disease progression and started second line treatment with liposomal doxorubicin  She is followed by Dr. Lonn.  Past medical history includes anxiety, GERD, hyperlipidemia, osteopenia.  Medications include B complex vitamin, Colace, Remeron , multivitamin, Zofran , MiraLAX, Compazine , vitamin D.  Labs include potassium 3.4, glucose 103, albumin 3.4.  Height: 5 feet 2 inches. Weight: 110 pounds 9.6 ounces September 16 110 pounds August 19 115 pounds June 24 120 pounds March 28  8% weight loss over 6 months/concerning.  Patient reports poor appetite and fatigue.  She has difficulty gaining weight but was pleased she had maintained her weight from last month.  Reports increased fatigue and less mobility after treatment.  She drinks 1 carton to 1-1/2 cartons Premier protein daily.  She does not really like them but she drinks them anyway.  Breakfast typically consists of eggs with cheese, sausage, and toast with jelly.  She drinks Premier protein at lunch.  Dinner could consist of lasagna, salad, and bread.  She has been trying to add dessert.  Often too tired to cook at night so tries to prepare food ahead of time.  Nutrition diagnosis:  Unintended weight loss related to cancer as evidenced by 10 pound weight loss/8% loss over the past 6 months.  Intervention: Educated on strategies to improve appetite.  Encouraged increased movement.  Recommended small frequent meals and snacks with protein. Recommend change ONS to Ensure complete or equivalent to provide 350 cal and 30 g of protein.  Try to drink twice  daily.  Provided sample and coupon. Reviewed ways to add additional calories to meals. Reviewed snack ideas. Provided nutrition facts sheets.  Gave contact information for questions.  Monitoring, evaluation, goals: Tolerate increased calories and protein to minimize further weight loss.  Next visit: Tuesday, October 14 during infusion.  **Disclaimer: This note was dictated with voice recognition software. Similar sounding words can inadvertently be transcribed and this note may contain transcription errors which may not have been corrected upon publication of note.**

## 2024-04-16 NOTE — Assessment & Plan Note (Addendum)
I spent some time counseling the patient the importance of tobacco cessation.  she appears motivated to quit.  

## 2024-04-16 NOTE — Assessment & Plan Note (Addendum)
 We discussed importance of frequent small meals and high-protein diet She will see dietitian today

## 2024-04-16 NOTE — Progress Notes (Signed)
 [facility] OFFICE PROGRESS NOTE  Patient Care Team: Katrinka Garnette KIDD, MD as PCP - General (Family Medicine) Carolee Sherwood JONETTA DOUGLAS, MD as Referring Physician (Urology)  Assessment & Plan Endometrial cancer [facility]) The patient was originally diagnosed with high-grade serous uterine cancer in 2017 and had surgery, chemotherapy and radiation treatment She is noted to have disease relapse in January 2025 with metastatic disease to supraclavicular lymphadenopathy, axillary lymphadenopathy and abdominal lymphadenopathy  Pathology: Recurrent high grade serous, CARIS [id number] p53 mutated, MSI stable, pMMR, BRCA neg, HEr2/Neu 1+, ER neg, PDL1 0%.  Repeat CARIS 2025: MSI stable, Her2/Neu 2+, PIK3cA mutated, p53 mutated, BRAF not detected, BRCA neg, ER 0%, Her2/neu 2+, PDL-1 0%  She developed carboplatin  allergy with cycle 3 of treatment PET/CT imaging from July 2025 unfortunately showed disease progression She elected to proceed with second line treatment with liposomal doxorubicin  Echocardiogram from July 2025 was normal  So far, she tolerated cycle 1& 2 of treatment well without major side effects We will proceed with cycle 3 today I plan to repeat imaging study in 2 weeks for objective assessment of response to therapy Weight loss, abnormal We discussed importance of frequent small meals and high-protein diet She will see dietitian today Tobacco abuse I spent some time counseling the patient the importance of tobacco cessation. she appears motivated to quit.   Orders Placed This Encounter  Procedures   NM PET Image Restag (PS) Skull Base To Thigh    Standing Status:   Future    Expected Date:   04/30/2024    Expiration Date:   04/16/2025    If indicated for the ordered procedure, I authorize the administration of a radiopharmaceutical per Radiology protocol:   Yes    Preferred imaging location?:   [facility] Almarie Lonn, MD  INTERVAL HISTORY: she returns for  treatment follow-up Complications related to previous cycle of chemotherapy included anemia, and fatigue, She is not able to gain weight She has poor appetite She complained of fatigue Denies mucositis or hand-foot syndrome or diarrhea We discussed timing of imaging Unfortunately, she is still smoking  PHYSICAL EXAMINATION: ECOG PERFORMANCE STATUS: 1 - Symptomatic but completely ambulatory  No results found for: CAN125    Latest Ref Rng & Units 04/16/2024    2:36 PM 03/19/2024    1:23 PM 02/19/2024   12:30 PM  CBC  WBC 4.0 - 10.5 K/uL 4.9  4.9  16.9   Hemoglobin 12.0 - 15.0 g/dL 88.7  88.0  87.6   Hematocrit 36.0 - 46.0 % 33.4  35.9  36.3   Platelets 150 - 400 K/uL 356  371  257       Chemistry      Component Value Date/Time   NA 139 03/19/2024 1323   NA 142 06/30/2016 1348   K 3.8 03/19/2024 1323   K 3.4 (L) 06/30/2016 1348   CL 105 03/19/2024 1323   CO2 28 03/19/2024 1323   CO2 28 06/30/2016 1348   BUN 15 03/19/2024 1323   BUN 8.7 06/30/2016 1348   CREATININE 0.78 03/19/2024 1323   CREATININE 1.32 (H) 05/07/2020 1519   CREATININE 0.8 06/30/2016 1348      Component Value Date/Time   CALCIUM  9.3 03/19/2024 1323   CALCIUM  10.0 06/30/2016 1348   ALKPHOS 114 03/19/2024 1323   ALKPHOS 88 06/30/2016 1348   AST 17 03/19/2024 1323   AST 17 06/30/2016 1348   ALT 11 03/19/2024 1323   ALT  12 06/30/2016 1348   BILITOT 0.3 03/19/2024 1323   BILITOT 0.31 06/30/2016 1348       Vitals:   04/16/24 1454  BP: 102/68  Pulse: (!) 111  Resp: 18  Temp: 97.8 F (36.6 C)  SpO2: 100%   Filed Weights   04/16/24 1454  Weight: 110 lb 9.6 oz (50.2 kg)   Other relevant data reviewed during this visit included CBC and CMP

## 2024-04-17 ENCOUNTER — Other Ambulatory Visit: Payer: Self-pay

## 2024-04-17 ENCOUNTER — Encounter: Payer: Self-pay | Admitting: Hematology and Oncology

## 2024-04-22 ENCOUNTER — Other Ambulatory Visit: Payer: Self-pay

## 2024-04-30 ENCOUNTER — Ambulatory Visit (HOSPITAL_COMMUNITY)
Admission: RE | Admit: 2024-04-30 | Discharge: 2024-04-30 | Disposition: A | Discharge status: Home / Self Care | Source: Ambulatory Visit | Attending: Hematology and Oncology | Admitting: Hematology and Oncology

## 2024-04-30 DIAGNOSIS — C541 Malignant neoplasm of endometrium: Secondary | ICD-10-CM | POA: Insufficient documentation

## 2024-05-01 ENCOUNTER — Ambulatory Visit (HOSPITAL_COMMUNITY)
Admission: RE | Admit: 2024-05-01 | Discharge: 2024-05-01 | Disposition: A | Discharge status: Home / Self Care | Source: Ambulatory Visit | Attending: Hematology and Oncology | Admitting: Hematology and Oncology

## 2024-05-01 DIAGNOSIS — C541 Malignant neoplasm of endometrium: Secondary | ICD-10-CM | POA: Insufficient documentation

## 2024-05-01 DIAGNOSIS — R918 Other nonspecific abnormal finding of lung field: Secondary | ICD-10-CM | POA: Diagnosis not present

## 2024-05-01 DIAGNOSIS — C7800 Secondary malignant neoplasm of unspecified lung: Secondary | ICD-10-CM | POA: Diagnosis not present

## 2024-05-01 DIAGNOSIS — C55 Malignant neoplasm of uterus, part unspecified: Secondary | ICD-10-CM | POA: Diagnosis not present

## 2024-05-01 LAB — GLUCOSE, CAPILLARY: Glucose-Capillary: 96 mg/dL (ref 70–99)

## 2024-05-01 MED ORDER — FLUDEOXYGLUCOSE F - 18 (FDG) INJECTION
5.5000 | Freq: Once | INTRAVENOUS | Status: AC
  Administered 2024-05-01: 5.47 via INTRAVENOUS

## 2024-05-02 ENCOUNTER — Telehealth: Payer: Self-pay

## 2024-05-02 NOTE — Telephone Encounter (Signed)
 Called and scheduled appt with Dr. Lonn for 10/3 at 1:45 pm. She is aware of appt.

## 2024-05-03 ENCOUNTER — Encounter: Payer: Self-pay | Admitting: Hematology and Oncology

## 2024-05-03 ENCOUNTER — Inpatient Hospital Stay: Attending: Hematology and Oncology | Admitting: Hematology and Oncology

## 2024-05-03 VITALS — BP 108/76 | HR 88 | Temp 97.5°F | Resp 18 | Wt 107.6 lb

## 2024-05-03 DIAGNOSIS — Z8701 Personal history of pneumonia (recurrent): Secondary | ICD-10-CM | POA: Diagnosis not present

## 2024-05-03 DIAGNOSIS — C541 Malignant neoplasm of endometrium: Secondary | ICD-10-CM | POA: Insufficient documentation

## 2024-05-03 DIAGNOSIS — K5900 Constipation, unspecified: Secondary | ICD-10-CM | POA: Diagnosis not present

## 2024-05-03 DIAGNOSIS — Z923 Personal history of irradiation: Secondary | ICD-10-CM | POA: Diagnosis not present

## 2024-05-03 DIAGNOSIS — R590 Localized enlarged lymph nodes: Secondary | ICD-10-CM | POA: Insufficient documentation

## 2024-05-03 DIAGNOSIS — Z5111 Encounter for antineoplastic chemotherapy: Secondary | ICD-10-CM | POA: Diagnosis not present

## 2024-05-03 DIAGNOSIS — R059 Cough, unspecified: Secondary | ICD-10-CM | POA: Insufficient documentation

## 2024-05-03 DIAGNOSIS — J449 Chronic obstructive pulmonary disease, unspecified: Secondary | ICD-10-CM | POA: Diagnosis not present

## 2024-05-03 NOTE — Progress Notes (Signed)
 [facility] OFFICE PROGRESS NOTE  Patient Care Team: Katrinka Garnette KIDD, MD as PCP - General (Family Medicine) Carolee Sherwood JONETTA DOUGLAS, MD as Referring Physician (Urology)  Assessment & Plan Endometrial cancer [facility]) The patient was originally diagnosed with high-grade serous uterine cancer in 2017 and had surgery, chemotherapy and radiation treatment She is noted to have disease relapse in January 2025 with metastatic disease to supraclavicular lymphadenopathy, axillary lymphadenopathy and abdominal lymphadenopathy  Pathology: Recurrent high grade serous, CARIS [id number] p53 mutated, MSI stable, pMMR, BRCA neg, HEr2/Neu 1+, ER neg, PDL1 0%.  Repeat CARIS 2025: MSI stable, Her2/Neu 2+, PIK3cA mutated, p53 mutated, BRAF not detected, BRCA neg, ER 0%, Her2/neu 2+, PDL-1 0%  She developed carboplatin  allergy with cycle 3 of treatment PET/CT imaging from July 2025 unfortunately showed disease progression She elected to proceed with second line treatment with liposomal doxorubicin  Echocardiogram from July 2025 was normal I reviewed recent PET/CT imaging from October 2025 and comparing that with his imaging from July 2025 which unfortunately showed disease progression Currently, the patient is considered to have progressed on multiple treatment including paclitaxel , dostarlimab , liposomal doxorubicin  and is allergic to carboplatin   I reviewed NCCN guidelines with the patient briefly Benefits from third line of chemotherapy is considered low, likely in the region of less than 20% benefit and at most improved survival by a few months That has to be balanced with risk of side effects including risk of severe infections, hospitalizations, allergic reactions and death with other treatment  Ultimately, the patient is undecided She will call me early next week for final decision We discussed prognosis We discussed CODE STATUS in regards to patients with stage IV metastatic cancer; the  patient is reluctant to consider DO NOT RESUSCITATE for fear that it could cause grief in her spouse and family We will resume further discussion in the near future once she made her decision  No orders of the defined types were placed in this encounter.    Almarie Bedford, MD  INTERVAL HISTORY: she returns for surveillance follow-up to review PET/CT imaging result She denies side effects from recent treatment I reviewed multiple imaging studies with the patient and husband We discussed treatment options We discussed advance care planning  PHYSICAL EXAMINATION: ECOG PERFORMANCE STATUS: 1 - Symptomatic but completely ambulatory  Vitals:   05/03/24 1403  BP: 108/76  Pulse: 88  Resp: 18  Temp: (!) 97.5 F (36.4 C)  SpO2: 92%   Filed Weights   05/03/24 1403  Weight: 107 lb 9.6 oz (48.8 kg)    Relevant data reviewed during this visit included PET/CT imaging

## 2024-05-03 NOTE — Assessment & Plan Note (Addendum)
 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  Repeat CARIS 2025: MSI stable, Her2/Neu 2+, PIK3cA mutated, p53 mutated, BRAF not detected, BRCA neg, ER 0%, Her2/neu 2+, PDL-1 0%  She developed carboplatin  allergy with cycle 3 of treatment PET/CT imaging from July 2025 unfortunately showed disease progression She elected to proceed with second line treatment with liposomal doxorubicin  Echocardiogram from July 2025 was normal I reviewed recent PET/CT imaging from October 2025 and comparing that with his imaging from July 2025 which unfortunately showed disease progression Currently, the patient is considered to have progressed on multiple treatment including paclitaxel , dostarlimab , liposomal doxorubicin  and is allergic to carboplatin   I reviewed NCCN guidelines with the patient briefly Benefits from third line of chemotherapy is considered low, likely in the region of less than 20% benefit and at most improved survival by a few months That has to be balanced with risk of side effects including risk of severe infections, hospitalizations, allergic reactions and death with other treatment  Ultimately, the patient is undecided She will call me early next week for final decision We discussed prognosis We discussed CODE STATUS in regards to patients with stage IV metastatic cancer; the patient is reluctant to consider DO NOT RESUSCITATE for fear that it could cause grief in her spouse and family We will resume further discussion in the near future once she made her decision

## 2024-05-06 ENCOUNTER — Telehealth: Payer: Self-pay

## 2024-05-06 NOTE — Telephone Encounter (Signed)
 Called and scheduled appt on 10/16, she is aware of appt date/time.

## 2024-05-07 ENCOUNTER — Inpatient Hospital Stay: Admitting: Hematology and Oncology

## 2024-05-08 ENCOUNTER — Telehealth: Payer: Self-pay | Admitting: *Deleted

## 2024-05-08 NOTE — Telephone Encounter (Signed)
 Ms. Armenteros left following VM:She sent a letter to the office for Dr. Lonn and she understood it was given to her. She's not sure it was clear in the letter, but she needs an inhaler to help her breathe - Breo. She does not need rescue inhaler. She stated she understood it could not be ordered until Thursday, but she really needs it today.  TCT to patient, left message for patient on named VM: She should go to ED, Urgent Care or contact her primary care provider for any difficulty breathing, depending on severity. Breo is not on her medication list, either current or in history, but also recommend she contact original prescriber for it if she needs it refilled. Repeated directions to seek emergent care or contact PCP depending on severity of difficulty breathing. Informed her Dr. Lonn will be sent this message

## 2024-05-09 NOTE — Telephone Encounter (Signed)
Called and left below message. Ask her to call the office back for questions/ concerns.

## 2024-05-09 NOTE — Telephone Encounter (Signed)
 Erminio, please call her I have never prescribed Breo, she can contact her pharmacy to figure out who prescribed before and call the prescriber for refills

## 2024-05-14 ENCOUNTER — Inpatient Hospital Stay: Admitting: Nutrition

## 2024-05-14 ENCOUNTER — Inpatient Hospital Stay

## 2024-05-14 ENCOUNTER — Inpatient Hospital Stay: Admitting: Hematology and Oncology

## 2024-05-16 ENCOUNTER — Telehealth: Payer: Self-pay

## 2024-05-16 ENCOUNTER — Inpatient Hospital Stay: Admitting: Hematology and Oncology

## 2024-05-16 ENCOUNTER — Encounter: Payer: Self-pay | Admitting: Hematology and Oncology

## 2024-05-16 VITALS — BP 112/72 | HR 104 | Temp 98.4°F | Resp 18 | Ht 62.0 in | Wt 103.2 lb

## 2024-05-16 DIAGNOSIS — R5381 Other malaise: Secondary | ICD-10-CM

## 2024-05-16 DIAGNOSIS — K5909 Other constipation: Secondary | ICD-10-CM | POA: Diagnosis not present

## 2024-05-16 DIAGNOSIS — J441 Chronic obstructive pulmonary disease with (acute) exacerbation: Secondary | ICD-10-CM

## 2024-05-16 DIAGNOSIS — Z7189 Other specified counseling: Secondary | ICD-10-CM

## 2024-05-16 DIAGNOSIS — C541 Malignant neoplasm of endometrium: Secondary | ICD-10-CM | POA: Diagnosis not present

## 2024-05-16 DIAGNOSIS — R634 Abnormal weight loss: Secondary | ICD-10-CM | POA: Diagnosis not present

## 2024-05-16 DIAGNOSIS — Z5111 Encounter for antineoplastic chemotherapy: Secondary | ICD-10-CM | POA: Diagnosis not present

## 2024-05-16 MED ORDER — ALBUTEROL SULFATE HFA 108 (90 BASE) MCG/ACT IN AERS: 1.0000 | INHALATION_SPRAY | Freq: Four times a day (QID) | RESPIRATORY_TRACT | 2 refills | Status: DC | PRN

## 2024-05-16 MED ORDER — PREDNISONE 20 MG PO TABS: 40.0000 mg | ORAL_TABLET | Freq: Every day | ORAL | 0 refills | Status: DC

## 2024-05-16 NOTE — Assessment & Plan Note (Addendum)
 She requests a disability parking placard I completed the form and gave it to her

## 2024-05-16 NOTE — Assessment & Plan Note (Addendum)
 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  Repeat CARIS 2025: MSI stable, Her2/Neu 2+, PIK3cA mutated, p53 mutated, BRAF not detected, BRCA neg, ER 0%, Her2/neu 2+, PDL-1 0%  She developed carboplatin  allergy with cycle 3 of treatment PET/CT imaging from July 2025 unfortunately showed disease progression She elected to proceed with second line treatment with liposomal doxorubicin  Echocardiogram from July 2025 was normal I reviewed recent PET/CT imaging from October 2025 and comparing that with his imaging from July 2025 which unfortunately showed disease progression Currently, the patient is considered to have progressed on multiple treatment including paclitaxel , dostarlimab , liposomal doxorubicin  and is allergic to carboplatin   I reviewed imaging studies and discussed the terminal nature of her disease and low risk of response to future chemo Ultimately, she is in agreement to stop treatment and focus on quality of life

## 2024-05-16 NOTE — Assessment & Plan Note (Addendum)
 She had severe constipation and recent nausea with vomiting We discussed importance of laxative therapy

## 2024-05-16 NOTE — Assessment & Plan Note (Addendum)
 Counseled and coordinated advanced care planning today.  Patient is aware she has stage IV disease and disease is incurable.  We discussed realistic expectation and ultimately, the patient agreed to stop treatment and enroll in palliative care with hospice I gave her DNR order I completed ACP planning on line I will send referral for palliative care with hospice service

## 2024-05-16 NOTE — Assessment & Plan Note (Addendum)
 Recent imaging study shows no signs of pneumonia Her examination is benign I suspect this is due to COPD exacerbation I will proceed to prescribe an inhaler for her to try and prednisone therapy I am hopeful, once she is enrolled in palliative care/hospice program, she can benefit from oxygen therapy

## 2024-05-16 NOTE — Progress Notes (Signed)
 [facility] OFFICE PROGRESS NOTE  Patient Care Team: Katrinka Garnette KIDD, MD as PCP - General (Family Medicine) Carolee Sherwood JONETTA DOUGLAS, MD as Referring Physician (Urology)  Assessment & Plan Endometrial cancer [facility]) The patient was originally diagnosed with high-grade serous uterine cancer in 2017 and had surgery, chemotherapy and radiation treatment She is noted to have disease relapse in January 2025 with metastatic disease to supraclavicular lymphadenopathy, axillary lymphadenopathy and abdominal lymphadenopathy  Pathology: Recurrent high grade serous, CARIS [id number] p53 mutated, MSI stable, pMMR, BRCA neg, HEr2/Neu 1+, ER neg, PDL1 0%.  Repeat CARIS 2025: MSI stable, Her2/Neu 2+, PIK3cA mutated, p53 mutated, BRAF not detected, BRCA neg, ER 0%, Her2/neu 2+, PDL-1 0%  She developed carboplatin  allergy with cycle 3 of treatment PET/CT imaging from July 2025 unfortunately showed disease progression She elected to proceed with second line treatment with liposomal doxorubicin  Echocardiogram from July 2025 was normal I reviewed recent PET/CT imaging from October 2025 and comparing that with his imaging from July 2025 which unfortunately showed disease progression Currently, the patient is considered to have progressed on multiple treatment including paclitaxel , dostarlimab , liposomal doxorubicin  and is allergic to carboplatin   I reviewed imaging studies and discussed the terminal nature of her disease and low risk of response to future chemo Ultimately, she is in agreement to stop treatment and focus on quality of life Weight loss, abnormal She continues to have progressive weight loss, likely due to her disease Continue supportive care I will prescribe prednisone for COPD exacerbation today that I am hopeful will also improve her appetite COPD exacerbation (HCC) Recent imaging study shows no signs of pneumonia Her examination is benign I suspect this is due to COPD  exacerbation I will proceed to prescribe an inhaler for her to try and prednisone therapy I am hopeful, once she is enrolled in palliative care/hospice program, she can benefit from oxygen therapy Other constipation She had severe constipation and recent nausea with vomiting We discussed importance of laxative therapy Physical debility She requests a disability parking placard I completed the form and gave it to her Goals of care, counseling/discussion Counseled and coordinated advanced care planning today.  Patient is aware she has stage IV disease and disease is incurable.  We discussed realistic expectation and ultimately, the patient agreed to stop treatment and enroll in palliative care with hospice I gave her DNR order I completed ACP planning on line I will send referral for palliative care with hospice service   No orders of the defined types were placed in this encounter.    Almarie Bedford, MD  INTERVAL HISTORY: she returns for further discussion about goals of care Her husband and daughter are present She felt weaker Appetite is reduced She is constipated and had 1 episode of nausea vomiting this morning She has antiemetics to take as needed She has been having coughing, predominantly nonproductive No fever or chills We discussed and reviewed imaging studies We discussed goals of care  PHYSICAL EXAMINATION: ECOG PERFORMANCE STATUS: 2 - Symptomatic, <50% confined to bed  Vitals:   05/16/24 1219  BP: 112/72  Pulse: (!) 104  Resp: 18  Temp: 98.4 F (36.9 C)  SpO2: 98%   Filed Weights   05/16/24 1219  Weight: 103 lb 3.2 oz (46.8 kg)   Limited chest examination showed no evidence of wheezes, normal respiration effort Relevant data reviewed during this visit included PET/CT imaging from October

## 2024-05-16 NOTE — Telephone Encounter (Signed)
 Called hospice referral to [facility], given info to intake person. They will contact Jeri.

## 2024-05-16 NOTE — Assessment & Plan Note (Addendum)
 She continues to have progressive weight loss, likely due to her disease Continue supportive care I will prescribe prednisone for COPD exacerbation today that I am hopeful will also improve her appetite

## 2024-05-20 ENCOUNTER — Telehealth: Payer: Self-pay

## 2024-05-20 NOTE — Telephone Encounter (Signed)
 Called and left a message asking her to call the office back.

## 2024-05-20 NOTE — Telephone Encounter (Signed)
-----   Message from Almarie Bedford sent at 05/20/2024  8:36 AM EDT ----- Can you call if her cough and appetite better with prednisone? Is constipation resolving? If better, reduce prednisone to 10 mg daily for another week Also if hospice has set up appt?

## 2024-05-20 NOTE — Telephone Encounter (Signed)
 Called her back and given below message. Hospice has been out today and Saturday to see her. Hospice changed Prednisone to Dexamethasone . Her cough and appetite is better. Constipation is better. She will call the office back for questions/ concerns. She appreciates the referral to hospice.

## 2024-08-01 DEATH — deceased
